# Patient Record
Sex: Female | Born: 1945 | Race: White | Hispanic: No | Smoking: Never smoker
Health system: Southern US, Community
[De-identification: ages and names within clinical notes are randomized; demographics above are authoritative.]

## PROBLEM LIST (undated history)

## (undated) DIAGNOSIS — M1990 Unspecified osteoarthritis, unspecified site: Secondary | ICD-10-CM

## (undated) DIAGNOSIS — E785 Hyperlipidemia, unspecified: Secondary | ICD-10-CM

## (undated) HISTORY — PX: FRACTURE SURGERY: SHX138

## (undated) HISTORY — PX: APPENDECTOMY: SHX54

## (undated) HISTORY — PX: AUGMENTATION MAMMAPLASTY: SUR837

## (undated) HISTORY — PX: KNEE ARTHROSCOPY: SHX127

## (undated) HISTORY — PX: VAGINAL HYSTERECTOMY: SUR661

## (undated) HISTORY — PX: TONSILLECTOMY: SUR1361

## (undated) HISTORY — PX: TUBAL LIGATION: SHX77

---
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
## 2001-12-08 ENCOUNTER — Emergency Department (HOSPITAL_COMMUNITY): Admission: EM | Admit: 2001-12-08 | Discharge: 2001-12-09 | Payer: Self-pay | Admitting: Emergency Medicine

## 2001-12-14 ENCOUNTER — Emergency Department (HOSPITAL_COMMUNITY): Admission: EM | Admit: 2001-12-14 | Discharge: 2001-12-14 | Payer: Self-pay | Admitting: Emergency Medicine

## 2009-04-14 ENCOUNTER — Ambulatory Visit (HOSPITAL_COMMUNITY): Admission: RE | Admit: 2009-04-14 | Discharge: 2009-04-14 | Payer: Self-pay | Admitting: Chiropractic Medicine

## 2016-02-17 ENCOUNTER — Other Ambulatory Visit: Payer: Self-pay | Admitting: Orthopedic Surgery

## 2016-02-17 DIAGNOSIS — R531 Weakness: Secondary | ICD-10-CM

## 2016-02-18 ENCOUNTER — Ambulatory Visit
Admission: RE | Admit: 2016-02-18 | Discharge: 2016-02-18 | Disposition: A | Discharge status: Home / Self Care | Payer: Medicare HMO | Source: Ambulatory Visit | Attending: Orthopedic Surgery | Admitting: Orthopedic Surgery

## 2016-02-18 DIAGNOSIS — R531 Weakness: Secondary | ICD-10-CM

## 2016-07-13 HISTORY — PX: CATARACT EXTRACTION W/ INTRAOCULAR LENS IMPLANT: SHX1309

## 2016-10-03 ENCOUNTER — Inpatient Hospital Stay (HOSPITAL_COMMUNITY): Payer: Medicare HMO | Admitting: Anesthesiology

## 2016-10-03 ENCOUNTER — Inpatient Hospital Stay (HOSPITAL_COMMUNITY): Payer: Medicare HMO

## 2016-10-03 ENCOUNTER — Emergency Department (HOSPITAL_COMMUNITY): Payer: Medicare HMO

## 2016-10-03 ENCOUNTER — Encounter (HOSPITAL_COMMUNITY): Payer: Self-pay | Admitting: Emergency Medicine

## 2016-10-03 ENCOUNTER — Encounter (HOSPITAL_COMMUNITY)
Admission: EM | Disposition: A | Discharge status: Home Health | Payer: Self-pay | Source: Home / Self Care | Attending: Internal Medicine

## 2016-10-03 ENCOUNTER — Inpatient Hospital Stay (HOSPITAL_COMMUNITY)
Admission: EM | Admit: 2016-10-03 | Discharge: 2016-10-12 | DRG: 470 | Disposition: A | Discharge status: Home Health | Payer: Medicare HMO | Attending: Internal Medicine | Admitting: Internal Medicine

## 2016-10-03 DIAGNOSIS — D62 Acute posthemorrhagic anemia: Secondary | ICD-10-CM | POA: Diagnosis not present

## 2016-10-03 DIAGNOSIS — Z8781 Personal history of (healed) traumatic fracture: Secondary | ICD-10-CM

## 2016-10-03 DIAGNOSIS — W010XXA Fall on same level from slipping, tripping and stumbling without subsequent striking against object, initial encounter: Secondary | ICD-10-CM | POA: Diagnosis present

## 2016-10-03 DIAGNOSIS — S72011A Unspecified intracapsular fracture of right femur, initial encounter for closed fracture: Principal | ICD-10-CM | POA: Diagnosis present

## 2016-10-03 DIAGNOSIS — M25551 Pain in right hip: Secondary | ICD-10-CM | POA: Diagnosis present

## 2016-10-03 DIAGNOSIS — S72001A Fracture of unspecified part of neck of right femur, initial encounter for closed fracture: Secondary | ICD-10-CM | POA: Diagnosis not present

## 2016-10-03 DIAGNOSIS — S76811A Strain of other specified muscles, fascia and tendons at thigh level, right thigh, initial encounter: Secondary | ICD-10-CM | POA: Diagnosis not present

## 2016-10-03 DIAGNOSIS — Z967 Presence of other bone and tendon implants: Secondary | ICD-10-CM | POA: Diagnosis not present

## 2016-10-03 DIAGNOSIS — E876 Hypokalemia: Secondary | ICD-10-CM | POA: Diagnosis not present

## 2016-10-03 DIAGNOSIS — S76011A Strain of muscle, fascia and tendon of right hip, initial encounter: Secondary | ICD-10-CM | POA: Diagnosis not present

## 2016-10-03 DIAGNOSIS — Z9889 Other specified postprocedural states: Secondary | ICD-10-CM

## 2016-10-03 DIAGNOSIS — D509 Iron deficiency anemia, unspecified: Secondary | ICD-10-CM | POA: Diagnosis not present

## 2016-10-03 DIAGNOSIS — D638 Anemia in other chronic diseases classified elsewhere: Secondary | ICD-10-CM | POA: Diagnosis not present

## 2016-10-03 HISTORY — PX: HIP ARTHROPLASTY: SHX981

## 2016-10-03 HISTORY — DX: Unspecified osteoarthritis, unspecified site: M19.90

## 2016-10-03 LAB — CBC WITH DIFFERENTIAL/PLATELET
Basophils Absolute: 0 10*3/uL (ref 0.0–0.1)
Basophils Relative: 0 %
EOS ABS: 0 10*3/uL (ref 0.0–0.7)
Eosinophils Relative: 0 %
HEMATOCRIT: 36.3 % (ref 36.0–46.0)
HEMOGLOBIN: 12.6 g/dL (ref 12.0–15.0)
LYMPHS ABS: 0.7 10*3/uL (ref 0.7–4.0)
LYMPHS PCT: 8 %
MCH: 36.6 pg — AB (ref 26.0–34.0)
MCHC: 34.7 g/dL (ref 30.0–36.0)
MCV: 105.5 fL — AB (ref 78.0–100.0)
MONOS PCT: 9 %
Monocytes Absolute: 0.8 10*3/uL (ref 0.1–1.0)
NEUTROS ABS: 7.2 10*3/uL (ref 1.7–7.7)
NEUTROS PCT: 83 %
Platelets: 311 10*3/uL (ref 150–400)
RBC: 3.44 MIL/uL — AB (ref 3.87–5.11)
RDW: 12.9 % (ref 11.5–15.5)
WBC: 8.7 10*3/uL (ref 4.0–10.5)

## 2016-10-03 LAB — ABO/RH: ABO/RH(D): O POS

## 2016-10-03 LAB — COMPREHENSIVE METABOLIC PANEL
ALK PHOS: 84 U/L (ref 38–126)
ALT: 23 U/L (ref 14–54)
ANION GAP: 13 (ref 5–15)
AST: 26 U/L (ref 15–41)
Albumin: 3.9 g/dL (ref 3.5–5.0)
BILIRUBIN TOTAL: 0.7 mg/dL (ref 0.3–1.2)
BUN: 24 mg/dL — ABNORMAL HIGH (ref 6–20)
CALCIUM: 9 mg/dL (ref 8.9–10.3)
CO2: 19 mmol/L — ABNORMAL LOW (ref 22–32)
CREATININE: 0.68 mg/dL (ref 0.44–1.00)
Chloride: 103 mmol/L (ref 101–111)
Glucose, Bld: 103 mg/dL — ABNORMAL HIGH (ref 65–99)
Potassium: 3.6 mmol/L (ref 3.5–5.1)
Sodium: 135 mmol/L (ref 135–145)
TOTAL PROTEIN: 7.1 g/dL (ref 6.5–8.1)

## 2016-10-03 LAB — CBC
HEMATOCRIT: 34 % — AB (ref 36.0–46.0)
HEMOGLOBIN: 11.4 g/dL — AB (ref 12.0–15.0)
MCH: 35.7 pg — ABNORMAL HIGH (ref 26.0–34.0)
MCHC: 33.5 g/dL (ref 30.0–36.0)
MCV: 106.6 fL — ABNORMAL HIGH (ref 78.0–100.0)
Platelets: 274 10*3/uL (ref 150–400)
RBC: 3.19 MIL/uL — AB (ref 3.87–5.11)
RDW: 12.8 % (ref 11.5–15.5)
WBC: 7.9 10*3/uL (ref 4.0–10.5)

## 2016-10-03 LAB — TYPE AND SCREEN
ABO/RH(D): O POS
Antibody Screen: NEGATIVE

## 2016-10-03 LAB — CREATININE, SERUM: Creatinine, Ser: 0.6 mg/dL (ref 0.44–1.00)

## 2016-10-03 LAB — PROTIME-INR
INR: 0.94
Prothrombin Time: 12.5 seconds (ref 11.4–15.2)

## 2016-10-03 SURGERY — HEMIARTHROPLASTY, HIP, DIRECT ANTERIOR APPROACH, FOR FRACTURE
Anesthesia: General | Site: Hip | Laterality: Right

## 2016-10-03 MED ORDER — POLYETHYLENE GLYCOL 3350 17 G PO PACK: 17.0000 g | PACK | Freq: Every day | ORAL | Status: DC | PRN

## 2016-10-03 MED ORDER — ACETAMINOPHEN 325 MG PO TABS: 650.0000 mg | ORAL_TABLET | Freq: Four times a day (QID) | ORAL | Status: DC | PRN

## 2016-10-03 MED ORDER — MORPHINE SULFATE (PF) 4 MG/ML IV SOLN: 2.0000 mg | INTRAVENOUS | Status: DC | PRN

## 2016-10-03 MED ORDER — PROPOFOL 10 MG/ML IV BOLUS
INTRAVENOUS | Status: DC | PRN
  Administered 2016-10-03: 120 mg via INTRAVENOUS

## 2016-10-03 MED ORDER — ENOXAPARIN SODIUM 40 MG/0.4ML ~~LOC~~ SOLN
40.0000 mg | SUBCUTANEOUS | Status: DC
  Administered 2016-10-04 – 2016-10-12 (×9): 40 mg via SUBCUTANEOUS
  Filled 2016-10-03 (×9): qty 0.4

## 2016-10-03 MED ORDER — ONDANSETRON HCL 4 MG/2ML IJ SOLN: 4.0000 mg | Freq: Four times a day (QID) | INTRAMUSCULAR | Status: DC | PRN

## 2016-10-03 MED ORDER — LACTATED RINGERS IV SOLN
INTRAVENOUS | Status: DC | PRN
  Administered 2016-10-03 (×2): via INTRAVENOUS

## 2016-10-03 MED ORDER — CEFAZOLIN SODIUM-DEXTROSE 2-3 GM-% IV SOLR
INTRAVENOUS | Status: DC | PRN
  Administered 2016-10-03: 2 g via INTRAVENOUS

## 2016-10-03 MED ORDER — DEXTROSE 5 % IV SOLN
INTRAVENOUS | Status: DC | PRN
  Administered 2016-10-03: 20 ug/min via INTRAVENOUS

## 2016-10-03 MED ORDER — PHENOL 1.4 % MT LIQD: 1.0000 | OROMUCOSAL | Status: DC | PRN

## 2016-10-03 MED ORDER — SODIUM CHLORIDE 0.9 % IV SOLN: 75.0000 mL/h | INTRAVENOUS | Status: DC

## 2016-10-03 MED ORDER — MAGNESIUM CITRATE PO SOLN: 1.0000 | Freq: Once | ORAL | Status: DC | PRN

## 2016-10-03 MED ORDER — SODIUM CHLORIDE 0.9 % IV BOLUS (SEPSIS)
1000.0000 mL | Freq: Once | INTRAVENOUS | Status: AC
Start: 2016-10-03 — End: 2016-10-03
  Administered 2016-10-03: 1000 mL via INTRAVENOUS

## 2016-10-03 MED ORDER — FENTANYL CITRATE (PF) 100 MCG/2ML IJ SOLN
INTRAMUSCULAR | Status: AC
  Administered 2016-10-03: 25 ug via INTRAVENOUS
  Filled 2016-10-03: qty 2

## 2016-10-03 MED ORDER — ROCURONIUM BROMIDE 100 MG/10ML IV SOLN
INTRAVENOUS | Status: DC | PRN
  Administered 2016-10-03: 50 mg via INTRAVENOUS

## 2016-10-03 MED ORDER — MENTHOL 3 MG MT LOZG: 1.0000 | LOZENGE | OROMUCOSAL | Status: DC | PRN

## 2016-10-03 MED ORDER — HYDROMORPHONE HCL 1 MG/ML IJ SOLN
1.0000 mg | Freq: Once | INTRAMUSCULAR | Status: AC
  Administered 2016-10-03: 1 mg via INTRAVENOUS
  Filled 2016-10-03: qty 1

## 2016-10-03 MED ORDER — SUGAMMADEX SODIUM 200 MG/2ML IV SOLN
INTRAVENOUS | Status: DC | PRN
  Administered 2016-10-03: 127 mg via INTRAVENOUS

## 2016-10-03 MED ORDER — BISACODYL 10 MG RE SUPP
10.0000 mg | Freq: Every day | RECTAL | Status: DC | PRN
  Filled 2016-10-03: qty 1

## 2016-10-03 MED ORDER — LIDOCAINE HCL (CARDIAC) 20 MG/ML IV SOLN
INTRAVENOUS | Status: DC | PRN
  Administered 2016-10-03: 80 mg via INTRAVENOUS

## 2016-10-03 MED ORDER — MORPHINE SULFATE (PF) 2 MG/ML IV SOLN
4.0000 mg | Freq: Once | INTRAVENOUS | Status: AC
  Administered 2016-10-03: 4 mg via INTRAVENOUS
  Filled 2016-10-03: qty 2

## 2016-10-03 MED ORDER — CEFAZOLIN SODIUM-DEXTROSE 2-4 GM/100ML-% IV SOLN
INTRAVENOUS | Status: AC
  Filled 2016-10-03: qty 100

## 2016-10-03 MED ORDER — BACLOFEN 10 MG PO TABS: 10.0000 mg | ORAL_TABLET | Freq: Three times a day (TID) | ORAL | 0 refills | Status: DC

## 2016-10-03 MED ORDER — ACETAMINOPHEN 650 MG RE SUPP: 650.0000 mg | Freq: Four times a day (QID) | RECTAL | Status: DC | PRN

## 2016-10-03 MED ORDER — SENNA-DOCUSATE SODIUM 8.6-50 MG PO TABS: 2.0000 | ORAL_TABLET | Freq: Every day | ORAL | 1 refills | Status: DC

## 2016-10-03 MED ORDER — DEXAMETHASONE SODIUM PHOSPHATE 4 MG/ML IJ SOLN
INTRAMUSCULAR | Status: DC | PRN
  Administered 2016-10-03: 10 mg via INTRAVENOUS

## 2016-10-03 MED ORDER — PHENYLEPHRINE HCL 10 MG/ML IJ SOLN
INTRAMUSCULAR | Status: DC | PRN
  Administered 2016-10-03 (×2): 80 ug via INTRAVENOUS

## 2016-10-03 MED ORDER — HYDROMORPHONE HCL 1 MG/ML IJ SOLN
2.0000 mg | INTRAMUSCULAR | Status: DC | PRN
  Administered 2016-10-03: 2 mg via INTRAVENOUS
  Filled 2016-10-03: qty 2

## 2016-10-03 MED ORDER — ONDANSETRON HCL 4 MG/2ML IJ SOLN
INTRAMUSCULAR | Status: DC | PRN
  Administered 2016-10-03: 4 mg via INTRAVENOUS

## 2016-10-03 MED ORDER — SENNA 8.6 MG PO TABS
1.0000 | ORAL_TABLET | Freq: Two times a day (BID) | ORAL | Status: DC
  Administered 2016-10-03 – 2016-10-12 (×17): 8.6 mg via ORAL
  Filled 2016-10-03 (×18): qty 1

## 2016-10-03 MED ORDER — FERROUS SULFATE 325 (65 FE) MG PO TABS
325.0000 mg | ORAL_TABLET | Freq: Three times a day (TID) | ORAL | Status: DC
  Administered 2016-10-03 – 2016-10-12 (×26): 325 mg via ORAL
  Filled 2016-10-03 (×26): qty 1

## 2016-10-03 MED ORDER — MORPHINE SULFATE (PF) 2 MG/ML IV SOLN: 2.0000 mg | INTRAVENOUS | Status: DC | PRN

## 2016-10-03 MED ORDER — ACETAMINOPHEN 10 MG/ML IV SOLN
INTRAVENOUS | Status: AC
  Filled 2016-10-03: qty 100

## 2016-10-03 MED ORDER — EPHEDRINE SULFATE 50 MG/ML IJ SOLN
INTRAMUSCULAR | Status: DC | PRN
  Administered 2016-10-03: 5 mg via INTRAVENOUS

## 2016-10-03 MED ORDER — FENTANYL CITRATE (PF) 250 MCG/5ML IJ SOLN
INTRAMUSCULAR | Status: AC
  Filled 2016-10-03: qty 5

## 2016-10-03 MED ORDER — ALUM & MAG HYDROXIDE-SIMETH 200-200-20 MG/5ML PO SUSP: 30.0000 mL | ORAL | Status: DC | PRN

## 2016-10-03 MED ORDER — SODIUM CHLORIDE 0.9 % IR SOLN
Status: DC | PRN
Start: 2016-10-03 — End: 2016-10-03
  Administered 2016-10-03: 1000 mL

## 2016-10-03 MED ORDER — HYDROCODONE-ACETAMINOPHEN 10-325 MG PO TABS: 1.0000 | ORAL_TABLET | Freq: Four times a day (QID) | ORAL | 0 refills | Status: DC | PRN

## 2016-10-03 MED ORDER — HYDROCODONE-ACETAMINOPHEN 5-325 MG PO TABS
1.0000 | ORAL_TABLET | Freq: Four times a day (QID) | ORAL | Status: DC | PRN
  Administered 2016-10-03 – 2016-10-04 (×4): 2 via ORAL
  Filled 2016-10-03 (×4): qty 2

## 2016-10-03 MED ORDER — ONDANSETRON HCL 4 MG PO TABS: 4.0000 mg | ORAL_TABLET | Freq: Three times a day (TID) | ORAL | 0 refills | Status: DC | PRN

## 2016-10-03 MED ORDER — FENTANYL CITRATE (PF) 100 MCG/2ML IJ SOLN
25.0000 ug | INTRAMUSCULAR | Status: DC | PRN
  Administered 2016-10-03 (×4): 25 ug via INTRAVENOUS

## 2016-10-03 MED ORDER — DOCUSATE SODIUM 100 MG PO CAPS
100.0000 mg | ORAL_CAPSULE | Freq: Two times a day (BID) | ORAL | Status: DC
  Administered 2016-10-03 – 2016-10-12 (×17): 100 mg via ORAL
  Filled 2016-10-03 (×18): qty 1

## 2016-10-03 MED ORDER — ACETAMINOPHEN 10 MG/ML IV SOLN
INTRAVENOUS | Status: DC | PRN
  Administered 2016-10-03: 1000 mg via INTRAVENOUS

## 2016-10-03 MED ORDER — CEFAZOLIN SODIUM-DEXTROSE 2-4 GM/100ML-% IV SOLN
2.0000 g | Freq: Four times a day (QID) | INTRAVENOUS | Status: AC
  Administered 2016-10-03 – 2016-10-04 (×2): 2 g via INTRAVENOUS
  Filled 2016-10-03 (×2): qty 100

## 2016-10-03 MED ORDER — ONDANSETRON HCL 4 MG/2ML IJ SOLN: 4.0000 mg | Freq: Once | INTRAMUSCULAR | Status: DC | PRN

## 2016-10-03 MED ORDER — ONDANSETRON HCL 4 MG/2ML IJ SOLN
INTRAMUSCULAR | Status: AC
  Filled 2016-10-03: qty 2

## 2016-10-03 MED ORDER — FENTANYL CITRATE (PF) 100 MCG/2ML IJ SOLN
INTRAMUSCULAR | Status: DC | PRN
  Administered 2016-10-03: 50 ug via INTRAVENOUS
  Administered 2016-10-03: 100 ug via INTRAVENOUS
  Administered 2016-10-03: 50 ug via INTRAVENOUS

## 2016-10-03 MED ORDER — POTASSIUM CHLORIDE IN NACL 20-0.9 MEQ/L-% IV SOLN
INTRAVENOUS | Status: DC
  Administered 2016-10-03: 21:00:00 via INTRAVENOUS
  Filled 2016-10-03: qty 1000

## 2016-10-03 MED ORDER — HYDROMORPHONE HCL 1 MG/ML IJ SOLN
1.0000 mg | INTRAMUSCULAR | Status: DC | PRN
  Administered 2016-10-03 – 2016-10-05 (×7): 1 mg via INTRAVENOUS
  Filled 2016-10-03 (×7): qty 1

## 2016-10-03 MED ORDER — ONDANSETRON HCL 4 MG PO TABS: 4.0000 mg | ORAL_TABLET | Freq: Four times a day (QID) | ORAL | Status: DC | PRN

## 2016-10-03 MED ORDER — ENOXAPARIN SODIUM 40 MG/0.4ML ~~LOC~~ SOLN: 40.0000 mg | SUBCUTANEOUS | 0 refills | Status: DC

## 2016-10-03 SURGICAL SUPPLY — 62 items
APL SKNCLS STERI-STRIP NONHPOA (GAUZE/BANDAGES/DRESSINGS) ×1
BENZOIN TINCTURE PRP APPL 2/3 (GAUZE/BANDAGES/DRESSINGS) ×3 IMPLANT
BLADE SAW SGTL 18.5X63.X.64 HD (BLADE) ×3 IMPLANT
BRUSH FEMORAL CANAL (MISCELLANEOUS) IMPLANT
CAPT HIP HEMI 1 ×2 IMPLANT
CLOSURE STERI-STRIP 1/2X4 (GAUZE/BANDAGES/DRESSINGS) ×1
CLSR STERI-STRIP ANTIMIC 1/2X4 (GAUZE/BANDAGES/DRESSINGS) ×2 IMPLANT
COVER BACK TABLE 24X17X13 BIG (DRAPES) IMPLANT
COVER SURGICAL LIGHT HANDLE (MISCELLANEOUS) ×3 IMPLANT
DRAPE IMP U-DRAPE 54X76 (DRAPES) ×3 IMPLANT
DRAPE INCISE IOBAN 66X45 STRL (DRAPES) ×2 IMPLANT
DRAPE ORTHO SPLIT 77X108 STRL (DRAPES) ×6
DRAPE SURG ORHT 6 SPLT 77X108 (DRAPES) ×2 IMPLANT
DRAPE U-SHAPE 47X51 STRL (DRAPES) ×3 IMPLANT
DRILL BIT 5/64 (BIT) ×3 IMPLANT
DRSG MEPILEX BORDER 4X12 (GAUZE/BANDAGES/DRESSINGS) IMPLANT
DRSG MEPILEX BORDER 4X8 (GAUZE/BANDAGES/DRESSINGS) ×2 IMPLANT
DURAPREP 26ML APPLICATOR (WOUND CARE) ×3 IMPLANT
ELECT BLADE 6.5 EXT (BLADE) ×2 IMPLANT
ELECT CAUTERY BLADE 6.4 (BLADE) ×3 IMPLANT
ELECT REM PT RETURN 9FT ADLT (ELECTROSURGICAL) ×3
ELECTRODE REM PT RTRN 9FT ADLT (ELECTROSURGICAL) ×1 IMPLANT
FACESHIELD WRAPAROUND (MASK) ×6 IMPLANT
FACESHIELD WRAPAROUND OR TEAM (MASK) ×2 IMPLANT
GLOVE BIOGEL PI ORTHO PRO SZ8 (GLOVE) ×2
GLOVE ORTHO TXT STRL SZ7.5 (GLOVE) ×3 IMPLANT
GLOVE PI ORTHO PRO STRL SZ8 (GLOVE) ×1 IMPLANT
GLOVE SURG ORTHO 8.0 STRL STRW (GLOVE) ×6 IMPLANT
GOWN STRL REUS W/ TWL XL LVL3 (GOWN DISPOSABLE) ×1 IMPLANT
GOWN STRL REUS W/TWL 2XL LVL3 (GOWN DISPOSABLE) ×3 IMPLANT
GOWN STRL REUS W/TWL XL LVL3 (GOWN DISPOSABLE) ×3
HANDPIECE INTERPULSE COAX TIP (DISPOSABLE)
KIT BASIN OR (CUSTOM PROCEDURE TRAY) ×3 IMPLANT
KIT ROOM TURNOVER OR (KITS) ×3 IMPLANT
MANIFOLD NEPTUNE II (INSTRUMENTS) ×3 IMPLANT
NDL SUT 6 .5 CRC .975X.05 MAYO (NEEDLE) IMPLANT
NEEDLE 22X1 1/2 (OR ONLY) (NEEDLE) ×3 IMPLANT
NEEDLE MAYO 1/2 CIRCLE (NEEDLE) ×2 IMPLANT
NEEDLE MAYO TAPER (NEEDLE)
NS IRRIG 1000ML POUR BTL (IV SOLUTION) ×3 IMPLANT
PACK TOTAL JOINT (CUSTOM PROCEDURE TRAY) ×3 IMPLANT
PACK UNIVERSAL I (CUSTOM PROCEDURE TRAY) ×3 IMPLANT
PAD ARMBOARD 7.5X6 YLW CONV (MISCELLANEOUS) ×6 IMPLANT
PILLOW ABDUCTION HIP (SOFTGOODS) ×3 IMPLANT
PRESSURIZER FEMORAL UNIV (MISCELLANEOUS) IMPLANT
RETRIEVER SUT HEWSON (MISCELLANEOUS) ×3 IMPLANT
SET HNDPC FAN SPRY TIP SCT (DISPOSABLE) IMPLANT
SUT FIBERWIRE #2 38 REV NDL BL (SUTURE) ×6
SUT VIC AB 0 CT1 27 (SUTURE) ×3
SUT VIC AB 0 CT1 27XBRD ANBCTR (SUTURE) ×1 IMPLANT
SUT VIC AB 1 CT1 27 (SUTURE) ×6
SUT VIC AB 1 CT1 27XBRD ANBCTR (SUTURE) ×2 IMPLANT
SUT VIC AB 2-0 CT1 27 (SUTURE) ×3
SUT VIC AB 2-0 CT1 TAPERPNT 27 (SUTURE) IMPLANT
SUT VIC AB 3-0 SH 8-18 (SUTURE) ×3 IMPLANT
SUTURE FIBERWR#2 38 REV NDL BL (SUTURE) ×2 IMPLANT
SYR CONTROL 10ML LL (SYRINGE) ×3 IMPLANT
TOWEL OR 17X24 6PK STRL BLUE (TOWEL DISPOSABLE) ×3 IMPLANT
TOWEL OR 17X26 10 PK STRL BLUE (TOWEL DISPOSABLE) ×3 IMPLANT
TOWER CARTRIDGE SMART MIX (DISPOSABLE) IMPLANT
TRAY FOLEY W/METER SILVER 16FR (SET/KITS/TRAYS/PACK) ×3 IMPLANT
WATER STERILE IRR 1000ML POUR (IV SOLUTION) ×12 IMPLANT

## 2016-10-03 NOTE — ED Triage Notes (Addendum)
Per EMS. Pt from home. Pt reports she slipped and fell 3 weeks ago causing her to have R leg pain. Pt fell again early this am causing R leg to hurt again. Pt reports pain is in R hip and thigh.  Pt reports pain is worst in R groin area to knee. Pt reports she is unable to bear weight on this leg.

## 2016-10-03 NOTE — ED Provider Notes (Signed)
[facility]-EMERGENCY DEPT Provider Note   CSN: [id number] Arrival date & time: 10/03/16  0731     History   Chief Complaint Chief Complaint  Patient presents with  . Leg Pain    HPI Jasmine Benson is a 71 y.o. female previous right knee arthroscopy here presenting with fall, right hip pain. Patient states that she fell about 3 weeks ago and has been having right hip pain since then. She has been walking on it with her crutches. This morning, patient was using her crutches and slipped and fell and landed on the right hip. Denies any passing out or head injury. She does not on blood thinners and last meal was last night around 8 PM.  The history is provided by the patient.    History reviewed. No pertinent past medical history.  There are no active problems to display for this patient.   Past Surgical History:  Procedure Laterality Date  . KNEE SURGERY Left     OB History    No data available       Home Medications    Prior to Admission medications   Not on File    Family History History reviewed. No pertinent family history.  Social History Social History  Substance Use Topics  . Smoking status: Never Smoker  . Smokeless tobacco: Never Used  . Alcohol use 0.6 oz/week    1 Glasses of wine per week     Allergies   Patient has no known allergies.   Review of Systems Review of Systems  Musculoskeletal:       R hip pain   All other systems reviewed and are negative.    Physical Exam Updated Vital Signs BP (!) 144/74 (BP Location: Left Arm)   Pulse (!) 123   Temp 98.2 F (36.8 C) (Oral)   Resp 18   Ht 5\' 6"  (1.676 m)   Wt 63.5 kg (140 lb)   SpO2 97%   BMI 22.60 kg/m   Physical Exam  Constitutional: She is oriented to person, place, and time.  Uncomfortable   HENT:  Head: Normocephalic and atraumatic.  Mouth/Throat: Oropharynx is clear and moist.  Eyes: Pupils are equal, round, and reactive to light. Conjunctivae and EOM are normal.    Neck: Normal range of motion. Neck supple.  Cardiovascular:  Tachycardic   Pulmonary/Chest: Effort normal and breath sounds normal. No respiratory distress. She has no wheezes. She has no rales.  Abdominal: Soft. Bowel sounds are normal. She exhibits no distension. There is no tenderness.  Musculoskeletal:  R hip tender, unable to range it. R hip externally rotated and shortened. 2+ pulses. Able to wiggle toes. Pelvis stable. No midline spinal tenderness   Neurological: She is alert and oriented to person, place, and time.  Skin: Skin is warm.  Psychiatric: She has a normal mood and affect.  Nursing note and vitals reviewed.    ED Treatments / Results  Labs (all labs ordered are listed, but only abnormal results are displayed) Labs Reviewed  CBC WITH DIFFERENTIAL/PLATELET  COMPREHENSIVE METABOLIC PANEL  PROTIME-INR  TYPE AND SCREEN    EKG  EKG Interpretation None       Radiology No results found.  Procedures Procedures (including critical care time)  Medications Ordered in ED Medications  morphine 2 MG/ML injection 4 mg (not administered)  sodium chloride 0.9 % bolus 1,000 mL (not administered)     Initial Impression / Assessment and Plan / ED Course  I have reviewed the  triage vital signs and the nursing notes.  Pertinent labs & imaging results that were available during my care of the patient were reviewed by me and considered in my medical decision making (see chart for details).    Jasmine Benson is a 71 y.o. female here with R hip injury s/p fall. Tachycardic and in pain. Denies chest pain or shortness of breath. Concerned for possible R hip fracture. Will get preop labs, xrays. Will give pain meds and reassess.   9:39 AM Xray showed R subcapital hip fracture. Dr. Dion Saucier consulted and recommend admission to hospitalist, transfer to [facility] for surgery later today, NPO for now.    Final Clinical Impressions(s) / ED Diagnoses   Final diagnoses:  None     New Prescriptions New Prescriptions   No medications on file     Jasmine Pander, MD 10/03/16 [phone]

## 2016-10-03 NOTE — Consult Note (Signed)
  ORTHOPAEDIC CONSULTATION  REQUESTING PHYSICIAN: Salomon Masthatterjee, Srobona Tubl*  Chief Complaint: right hip pain  HPI: Jasmine Benson is a 71 y.o. female who complains of  Right hip pain after a fall.  She had some preexisting hip pain for the past couple of weeks, and was trying to navigate with crutches, when she fell this morning.  She has had a previous knee arthroscopy done earlier this year by Dr. Valentina GuLucy. She also works [employer]. Pain is moderate to severe over the right hip, worse with movement, better with rest. Denies any other injuries.  History reviewed. No pertinent past medical history. Past Surgical History:  Procedure Laterality Date  . KNEE SURGERY Left    Social History   Social History  . Marital status: Single    Spouse name: N/A  . Number of children: N/A  . Years of education: N/A   Social History Main Topics  . Smoking status: Never Smoker  . Smokeless tobacco: Never Used  . Alcohol use 0.6 oz/week    1 Glasses of wine per week  . Drug use: Unknown  . Sexual activity: Not Asked   Other Topics Concern  . None   Social History Narrative  . None   History reviewed. No pertinent family history. No Known Allergies   Positive ROS: All other systems have been reviewed and were otherwise negative with the exception of those mentioned in the HPI and as above.  Physical Exam: General: Alert, no acute distress Cardiovascular: No pedal edema Respiratory: No cyanosis, no use of accessory musculature GI: No organomegaly, abdomen is soft and non-tender Skin: No lesions in the area of chief complaint Neurologic: Sensation intact distally Psychiatric: Patient is competent for consent with normal mood and affect Lymphatic: No axillary or cervical lymphadenopathy  MUSCULOSKELETAL: Right hip EHL and FHL are intact. Positive logroll. Mild bruising laterally. Positive pain to palpation laterally.  Assessment: Displaced right femoral neck  fracture, acute,    Plan: This is an acute severe injury and I recommended surgical intervention with hemiarthroplasty in order to optimize long-term function.  The risks benefits and alternatives were discussed with the patient including but not limited to the risks of nonoperative treatment, versus surgical intervention including infection, bleeding, nerve injury, periprosthetic fracture, the need for revision surgery, dislocation, leg length discrepancy, blood clots, cardiopulmonary complications, morbidity, mortality, among others, and they were willing to proceed.      Eulas PostLANDAU,Inman Fettig P, MD Cell [phone]   10/03/2016 2:16 PM

## 2016-10-03 NOTE — ED Notes (Signed)
Bed: WA12 Expected date:  Expected time:  Means of arrival:  Comments: 71 yo Fall, leg/hip pain

## 2016-10-03 NOTE — Anesthesia Preprocedure Evaluation (Addendum)
Anesthesia Evaluation  Patient identified by MRN, date of birth, ID band Patient awake    Reviewed: Allergy & Precautions, NPO status , Patient's Chart, lab work & pertinent test results  Airway Mallampati: II  TM Distance: >3 FB Neck ROM: Full    Dental  (+) Teeth Intact, Dental Advisory Given, Caps   Pulmonary neg pulmonary ROS,    Pulmonary exam normal breath sounds clear to auscultation       Cardiovascular negative cardio ROS Normal cardiovascular exam Rhythm:Regular Rate:Normal     Neuro/Psych negative neurological ROS     GI/Hepatic negative GI ROS, Neg liver ROS,   Endo/Other  negative endocrine ROS  Renal/GU negative Renal ROS     Musculoskeletal  (+) Arthritis , Osteoarthritis,  Right femoral neck fracture    Abdominal   Peds  Hematology negative hematology ROS (+) Plt 311k   Anesthesia Other Findings Day of surgery medications reviewed with the patient.  Reproductive/Obstetrics                            Anesthesia Physical Anesthesia Plan  ASA: II  Anesthesia Plan: General   Post-op Pain Management:    Induction: Intravenous  PONV Risk Score and Plan: 3 and Ondansetron, Dexamethasone and Midazolam  Airway Management Planned: Oral ETT  Additional Equipment:   Intra-op Plan:   Post-operative Plan: Extubation in OR  Informed Consent: I have reviewed the patients History and Physical, chart, labs and discussed the procedure including the risks, benefits and alternatives for the proposed anesthesia with the patient or authorized representative who has indicated his/her understanding and acceptance.   Dental advisory given  Plan Discussed with: CRNA  Anesthesia Plan Comments: (Risks/benefits of general anesthesia discussed with patient including risk of damage to teeth, lips, gum, and tongue, nausea/vomiting, allergic reactions to medications, and the possibility of  heart attack, stroke and death.  All patient questions answered.  Patient wishes to proceed.)        Anesthesia Quick Evaluation

## 2016-10-03 NOTE — Op Note (Signed)
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 female who was admitted 10/03/2016 with a diagnosis of Hip fracture, right, closed, initial encounter [facility]) and elected for surgical management.  The risks benefits and alternatives were discussed with the patient including but not limited to the risks of nonoperative treatment, versus surgical intervention including infection, bleeding, nerve injury, periprosthetic fracture, the need for revision surgery, dislocation, leg length discrepancy, blood clots, cardiopulmonary complications, morbidity, mortality, among others, and they were willing to proceed.  Predicted outcome is good, although there will be at least a six to nine month expected recovery.   OPERATIVE REPORT     SURGEON:  Marchia Bond, MD    ASSISTANT:  Joya Gaskins, OPA-C  (Present throughout the entire procedure,  necessary for completion of procedure in a timely manner, assisting with retraction, instrumentation, and closure)     ANESTHESIA:  General  ESTIMATED BLOOD LOSS: 509 mL    COMPLICATIONS:  None.      COMPONENTS:  Chemical engineer Femoral Fracture stem size 5, with a -3 spacer and a size 47 fracture head unipolar hip ball.  I used a total of 3 #2 FiberWire for repair of the gluteus medius tendon brought through the trochanteric drill holes that we used for the capsular repair. One of the fiber wires I used side-to-side configuration.  Unique aspects of the case: I ended up having to cut the femoral neck twice because the first cut was fairly long, and the neck  was long, and reduction was not possible. Soft tissue was too tight. Additionally I did use a calcar planer. I had to release the capsule down to the level of the acetabulum in order to gain mobility for reduction.  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    PROCEDURE IN DETAIL: The patient was met in the holding area and identified.  The appropriate hip  was marked at the operative site. The patient was then transported to the OR and  placed under general anesthesia.  At that point, the patient was  placed in the lateral decubitus position with the operative side up and  secured to the operating room table and all bony prominences padded.     The operative lower extremity was prepped from the iliac crest to the toes.  Sterile draping was performed.  Time out was performed prior to incision.      A routine posterolateral approach was utilized via sharp dissection  carried down to the subcutaneous tissue.  Gross bleeders were Bovie  coagulated.  The iliotibial band was identified and incised  along the length of the skin incision.  Self-retaining retractors were  inserted.  With the hip internally rotated, the short external rotators  were identified. The piriformis was tagged with FiberWire, and the hip capsule released in a T-type fashion.  The femoral neck was exposed, and I resected the femoral neck using the appropriate jig. This was performed at approximately a thumb's breadth above the lesser trochanter.    I then exposed the deep acetabulum,  cleared out any tissue including the ligamentum teres, and included the hip capsule in the FiberWire used above and below the T.    I then prepared the proximal femur using the cookie-cutter, the lateralizing reamer, and then sequentially broached. On my first set of trials, I was not able to reduce the hip and I had to go back and cut the femur  again.  A trial was then utilized, and I reduced the hip and it was found to have excellent stability with functional range of motion. The trial components were then removed.   I then place the real implant and I impacted the real head ball into place. The hip was then reduced and taken through functional range of motion and found to have excellent stability. Leg lengths were restored.  I then used a 2 mm drill bits to pass the FiberWire suture from the capsule and piriformis through the greater trochanter, and secured this. Excellent posterior capsular repair was achieved. I also closed the T in the capsule.  I also applied a curette to the lateral greater trochanter where the avulsed portion of the tendon was present, with the gluteus medius sleeve, and I performed a side-to-side repair superiorly of the gluteus sleeve at the avulsed area, and then used the suture that I brought out through the drill holes in the greater trochanter and used a free needle to pass each of the 4 limbs from the suture through the soft tissue, and then repaired that back down to the bleeding bony bed that I had already picked prepared. Excellent repair of the gluteus sleeve was achieved. I suspect that she had preoperative pain in this location from her chronic tear. She did mention that she had preoperative pain as well, we were considering whether or not this was from her hip and if she had a nondisplaced neck fracture, but it certainly may have been from the tendon as well.  I then irrigated the hip copiously again with pulse lavage, and repaired the fascia with Vicryl, followed by Vicryl for the subcutaneous tissue, Monocryl for the skin, Steri-Strips and sterile gauze. The wounds were injected. The patient was then awakened and returned to PACU in stable and satisfactory condition. There were no complications.  Marchia Bond, MD Orthopedic Surgeon [phone]   10/03/2016 4:30 PM

## 2016-10-03 NOTE — Progress Notes (Signed)
Patient arrived to the floor via bed. Alert, Oriented X4. VSS. Dressing to right hip clean, dry and intact. Patient oriented to room. RN to release orders. Will continue to monitor.

## 2016-10-03 NOTE — ED Notes (Signed)
Pt in xray

## 2016-10-03 NOTE — Transfer of Care (Signed)
Immediate Anesthesia Transfer of Care Note  Patient: Jasmine Benson  Procedure(s) Performed: Procedure(s): ARTHROPLASTY BIPOLAR HIP (HEMIARTHROPLASTY) (Right)  Patient Location: PACU  Anesthesia Type:General  Level of Consciousness: awake, alert  and oriented  Airway & Oxygen Therapy: Patient Spontanous Breathing and Patient connected to nasal cannula oxygen  Post-op Assessment: Report given to RN and Post -op Vital signs reviewed and stable  Post vital signs: Reviewed and stable  Last Vitals:  Vitals:   10/03/16 1130 10/03/16 1230  BP: 125/72 105/74  Pulse:  (!) 110  Resp: 17 14  Temp:    SpO2:  93%    Last Pain:  Vitals:   10/03/16 1103  TempSrc:   PainSc: 8          Complications: No apparent anesthesia complications

## 2016-10-03 NOTE — Anesthesia Postprocedure Evaluation (Signed)
Anesthesia Post Note  Patient: Erskine EmerySharron M Salim  Procedure(s) Performed: Procedure(s) (LRB): ARTHROPLASTY BIPOLAR HIP (HEMIARTHROPLASTY) (Right)     Patient location during evaluation: PACU Anesthesia Type: General Level of consciousness: awake and alert Pain management: pain level controlled Vital Signs Assessment: post-procedure vital signs reviewed and stable Respiratory status: spontaneous breathing, nonlabored ventilation, respiratory function stable and patient connected to nasal cannula oxygen Cardiovascular status: blood pressure returned to baseline and stable Postop Assessment: no signs of nausea or vomiting Anesthetic complications: no    Last Vitals:  Vitals:   10/03/16 1756 10/03/16 1813  BP:  124/64  Pulse: 84 97  Resp: 12 13  Temp: (!) 36.2 C (!) 36.4 C  SpO2: 96% 99%    Last Pain:  Vitals:   10/03/16 1813  TempSrc: Oral  PainSc:                  Cecile HearingStephen Edward Carlos Quackenbush

## 2016-10-03 NOTE — Discharge Instructions (Signed)

## 2016-10-03 NOTE — Anesthesia Procedure Notes (Signed)
Procedure Name: Intubation Date/Time: 10/03/2016 2:34 PM Performed by: Clearnce Sorrel Pre-anesthesia Checklist: Patient identified, Emergency Drugs available, Suction available, Patient being monitored and Timeout performed Patient Re-evaluated:Patient Re-evaluated prior to induction Oxygen Delivery Method: Circle system utilized Preoxygenation: Pre-oxygenation with 100% oxygen Induction Type: IV induction Ventilation: Mask ventilation without difficulty and Oral airway inserted - appropriate to patient size Laryngoscope Size: Mac and 3 Grade View: Grade I Tube type: Oral Tube size: 7.0 mm Number of attempts: 1 Airway Equipment and Method: Stylet Placement Confirmation: ETT inserted through vocal cords under direct vision,  positive ETCO2 and breath sounds checked- equal and bilateral Secured at: 22 cm Tube secured with: Tape Dental Injury: Teeth and Oropharynx as per pre-operative assessment

## 2016-10-04 ENCOUNTER — Encounter (HOSPITAL_COMMUNITY): Payer: Self-pay | Admitting: Orthopedic Surgery

## 2016-10-04 DIAGNOSIS — D62 Acute posthemorrhagic anemia: Secondary | ICD-10-CM

## 2016-10-04 LAB — CBC
HCT: 30.5 % — ABNORMAL LOW (ref 36.0–46.0)
HEMOGLOBIN: 10.1 g/dL — AB (ref 12.0–15.0)
MCH: 35.6 pg — AB (ref 26.0–34.0)
MCHC: 33.1 g/dL (ref 30.0–36.0)
MCV: 107.4 fL — AB (ref 78.0–100.0)
PLATELETS: 277 10*3/uL (ref 150–400)
RBC: 2.84 MIL/uL — AB (ref 3.87–5.11)
RDW: 12.8 % (ref 11.5–15.5)
WBC: 7.8 10*3/uL (ref 4.0–10.5)

## 2016-10-04 LAB — BASIC METABOLIC PANEL
ANION GAP: 9 (ref 5–15)
BUN: 10 mg/dL (ref 6–20)
CHLORIDE: 106 mmol/L (ref 101–111)
CO2: 20 mmol/L — AB (ref 22–32)
Calcium: 7.9 mg/dL — ABNORMAL LOW (ref 8.9–10.3)
Creatinine, Ser: 0.64 mg/dL (ref 0.44–1.00)
GFR calc non Af Amer: >60 mL/min (ref >60)
Glucose, Bld: 176 mg/dL — ABNORMAL HIGH (ref 65–99)
Potassium: 3.9 mmol/L (ref 3.5–5.1)
SODIUM: 135 mmol/L (ref 135–145)

## 2016-10-04 MED ORDER — HYDROCODONE-ACETAMINOPHEN 5-325 MG PO TABS
1.0000 | ORAL_TABLET | ORAL | Status: DC | PRN
  Administered 2016-10-04 – 2016-10-05 (×4): 2 via ORAL
  Filled 2016-10-04 (×4): qty 2

## 2016-10-04 NOTE — Evaluation (Signed)
Occupational Therapy Evaluation Patient Details Name: Jasmine Benson MRN: [id number] DOB: 1945/08/09 Today's Date: 10/04/2016    History of Present Illness 72 yo admitted after fall at home with right hip fx s/p hemiarthoplasty posterior approach with glute med tendon repair since fall in July. PMHx:    Clinical Impression   PTA, pt was living alone and was independent. Currently, pt requires Max A for LB ADLs and Min Guard A for functional mobility using RW. Provided education on posterior hip precautions, need for AE, and toilet transfer with 3N1. Pt would benefit form further acute OT to address LB ADLs and facilitate safe dc. Recommend dc home with HHOT to optimize safety and independence with ADLs and functional mobility.     Follow Up Recommendations  Home health OT;Supervision - Intermittent    Equipment Recommendations  3 in 1 bedside commode    Recommendations for Other Services PT consult     Precautions / Restrictions Precautions Precautions: Fall;Posterior Hip Precaution Booklet Issued: Yes (comment) Precaution Comments: Reviewed Hip precautions. Pt able to recall 2/3 precautions independently with Min VCs to remember no bending past 90 Restrictions Weight Bearing Restrictions: Yes RLE Weight Bearing: Weight bearing as tolerated      Mobility Bed Mobility Overal bed mobility: Needs Assistance Bed Mobility: Supine to Sit     Supine to sit: Supervision;HOB elevated     General bed mobility comments: pt able to pivot to EOB with cues and good ability to maintain precautions, Pt used bedrails and HOB elevated  Transfers Overall transfer level: Needs assistance   Transfers: Sit to/from Stand Sit to Stand: Min guard         General transfer comment: cues for hand and RLE positioning    Balance Overall balance assessment: History of Falls                                         ADL either performed or assessed with clinical  judgement   ADL Overall ADL's : Needs assistance/impaired Eating/Feeding: Set up;Sitting   Grooming: Min guard;Standing   Upper Body Bathing: Set up;Sitting   Lower Body Bathing: Maximal assistance;Sit to/from stand Lower Body Bathing Details (indicate cue type and reason): Pt will need education on LB ADLs with AE to adhere to precautions Upper Body Dressing : Set up;Sitting   Lower Body Dressing: Maximal assistance;Sit to/from stand Lower Body Dressing Details (indicate cue type and reason): Pt will need education on LB ADLs with AE to adhere to precautions Toilet Transfer: Min guard;Ambulation;RW (Simulated to recliner)           Functional mobility during ADLs: Min guard;Rolling walker General ADL Comments: Pt demonstrating good functional performance post surgery. Able to perform UB ADLs and functional mobility with set up-Min guard. Pt will require education for LB ADLs for adherance to posterior hip precautions.      Vision         Perception     Praxis      Pertinent Vitals/Pain Pain Assessment: 0-10 Pain Score: 6  Pain Location: right hip Pain Descriptors / Indicators: Throbbing Pain Intervention(s): Monitored during session;Limited activity within patient's tolerance;Repositioned     Hand Dominance Right   Extremity/Trunk Assessment Upper Extremity Assessment Upper Extremity Assessment: Overall WFL for tasks assessed   Lower Extremity Assessment Lower Extremity Assessment: Defer to PT evaluation RLE Deficits / Details: decreased ROm and strength post  op   Cervical / Trunk Assessment Cervical / Trunk Assessment: Normal   Communication Communication Communication: No difficulties   Cognition Arousal/Alertness: Awake/alert Behavior During Therapy: WFL for tasks assessed/performed Overall Cognitive Status: Within Functional Limits for tasks assessed                                     General Comments   VSS    Exercises     Shoulder Instructions      Home Living Family/patient expects to be discharged to:: Private residence Living Arrangements: Alone Available Help at Discharge: Friend(s);Available PRN/intermittently Type of Home: House Home Access: Stairs to enter Entrance Stairs-Number of Steps: 1   Home Layout: Two level;1/2 bath on main level;Bed/bath upstairs Alternate Level Stairs-Number of Steps: 16 Alternate Level Stairs-Rails: Left Bathroom Shower/Tub: Producer, television/film/video: Standard     Home Equipment: Crutches;Hand held shower head   Additional Comments: pt has been sleeping downstairs since fall in July      Prior Functioning/Environment Level of Independence: Independent        Comments: with crutches since glute med tear, sponge bathing downstairs and sleeping on couch, continues to drive and go to work        OT Problem List: Decreased range of motion;Decreased activity tolerance;Impaired balance (sitting and/or standing);Decreased safety awareness;Decreased knowledge of use of DME or AE;Decreased knowledge of precautions;Pain      OT Treatment/Interventions: Self-care/ADL training;Therapeutic exercise;Energy conservation;DME and/or AE instruction;Therapeutic activities;Patient/family education    OT Goals(Current goals can be found in the care plan section) Acute Rehab OT Goals 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 Goal Formulation: With patient Time For Goal Achievement: 10/18/16 Potential to Achieve Goals: Good ADL Goals Pt Will Perform Lower Body Bathing: with set-up;with supervision;sit to/from stand;with adaptive equipment Pt Will Perform Lower Body Dressing: with set-up;with supervision;sit to/from stand;with adaptive equipment Pt Will Transfer to Toilet: bedside commode;ambulating;with set-up;with supervision (BSC over toilet) Additional ADL Goal #1: Pt will independently recall 3/3 posterior hip precautions  OT Frequency: Min  2X/week   Barriers to D/C:            Co-evaluation              AM-PAC PT "6 Clicks" Daily Activity     Outcome Measure Help from another person eating meals?: None Help from another person taking care of personal grooming?: A Little Help from another person toileting, which includes using toliet, bedpan, or urinal?: A Little Help from another person bathing (including washing, rinsing, drying)?: A Little Help from another person to put on and taking off regular upper body clothing?: None Help from another person to put on and taking off regular lower body clothing?: A Lot 6 Click Score: 19   End of Session Equipment Utilized During Treatment: Gait belt;Rolling walker Nurse Communication: Mobility status;Precautions;Weight bearing status  Activity Tolerance: Patient tolerated treatment well Patient left: in chair;with call bell/phone within reach  OT Visit Diagnosis: Unsteadiness on feet (R26.81);Other abnormalities of gait and mobility (R26.89);Muscle weakness (generalized) (M62.81);Pain;History of falling (Z91.81) Pain - Right/Left: Right Pain - part of body: Hip                Time: 1610-9604 OT Time Calculation (min): 22 min Charges:  OT General Charges $OT Visit: 1 Procedure OT Evaluation $OT Eval Low Complexity: 1 Procedure G-Codes:     Geraldine Tesar MSOT, OTR/L  Acute Rehab Pager: [phone] Office: [phone]  Theodoro GristCharis M Shonica Weier 10/04/2016, 12:53 PM

## 2016-10-04 NOTE — Progress Notes (Signed)
PROGRESS NOTE  Jasmine Benson  AVW:[id number] DOB: Oct 11, 1945 DOA: 10/03/2016 PCP: Patient, No Pcp Per  Brief Narrative:   71 yo F with presented with right hip pain.  She is otherwise healthy but a few weeks ago, she almost slipped on an ice cube on her floor and since that time, she has been trying to use crutches for pain in her right hip.  She had a mechanical fall that resulted in ER visit where she was found to have a right femoral neck fracture.  She underwent right hip hemiarthroplasty and right hip repair of her gluteus medius tendon on 10/03/2016.  She is doing well post-operatively, POD 1.  She lives home alone.  PT/OT recommending intermittent supervision.    Assessment & Plan:   Principal Problem:   Hip fracture, right, closed, initial encounter (HCC)  Closed right hip fracture, s/p right hip hemiarthroplasty and right hip repair of her gluteus medius tendon on 10/03/2016 -  Requesting increased frequency of pain medication -  PT/OT recommending home health services, which have been ordered -  WBAT -  DVT prophylaxis per orthopedic surgery  Mild post-operative anemia expected -   No need for blood transfusion -  Continue iron supplementation  DVT prophylaxis:  lovenox Code Status:  full Family Communication:  Patient alone Disposition Plan:  To home, but patient lives alone and will need at least intermittent supervision.  HH services ordered.     Consultants:   ORthopedic surgery, Dr. Dion SaucierLandau  Procedures:  right hip hemiarthroplasty and right hip repair of her gluteus medius tendon on 10/03/2016  Antimicrobials:  Anti-infectives    Start     Dose/Rate Route Frequency Ordered Stop   10/03/16 2030  ceFAZolin (ANCEF) IVPB 2g/100 mL premix     2 g 200 mL/hr over 30 Minutes Intravenous Every 6 hours 10/03/16 1641 10/04/16 0347   10/03/16 1358  ceFAZolin (ANCEF) 2-4 GM/100ML-% IVPB    Comments:  Jasmine Benson   : cabinet override      10/03/16 1358 10/04/16  0214       Subjective:  Feeling well.  Was able to walk a Jasmine Benson distance with PT today.  Denies chest pains, SOB, nausea.  Ate a good breakfast this morning. Pain medication wearing off too soon.    Objective: Vitals:   10/04/16 0215 10/04/16 0457 10/04/16 1023 10/04/16 1332  BP: (!) 113/59 106/63 (!) 90/53 117/66  Pulse: 85 79 88 (!) 102  Resp: 14 14 16 18   Temp: 98.3 F (36.8 C) 98.3 F (36.8 C) 97.8 F (36.6 C) 98.1 F (36.7 C)  TempSrc: Oral Oral Oral Oral  SpO2: 94% 94% 96% 98%  Weight:      Height:        Intake/Output Summary (Last 24 hours) at 10/04/16 1359 Last data filed at 10/04/16 1334  Gross per 24 hour  Intake          3418.75 ml  Output             2375 ml  Net          1043.75 ml   Filed Weights   10/03/16 0737  Weight: 63.5 kg (140 lb)    Examination:  General exam:  Adult female, sitting in chair.  No acute distress.  HEENT:  NCAT, MMM Respiratory system: Clear to auscultation bilaterally Cardiovascular system: Regular rate and rhythm, normal S1/S2. No murmurs, rubs, gallops or clicks.  Warm extremities Gastrointestinal system: Normal active bowel sounds,  soft, nondistended, nontender. MSK:  Normal tone and bulk, no lower extremity edema, right hip minimally swollen.  Dressing with a single drop of dried blood, no obvious ecchymoses Neuro:  Grossly intact, SITLT bilateral feet, 2+ pedal pulses    Data Reviewed: I have personally reviewed following labs and imaging studies  CBC:  Recent Labs Lab 10/03/16 0808 10/03/16 2002 10/04/16 0347  WBC 8.7 7.9 7.8  NEUTROABS 7.2  --   --   HGB 12.6 11.4* 10.1*  HCT 36.3 34.0* 30.5*  MCV 105.5* 106.6* 107.4*  PLT 311 274 277   Basic Metabolic Panel:  Recent Labs Lab 10/03/16 0808 10/03/16 2002 10/04/16 0347  NA 135  --  135  K 3.6  --  3.9  CL 103  --  106  CO2 19*  --  20*  GLUCOSE 103*  --  176*  BUN 24*  --  10  CREATININE 0.68 0.60 0.64  CALCIUM 9.0  --  7.9*   GFR: Estimated  Creatinine Clearance: 61.3 mL/min (by C-G formula based on SCr of 0.64 mg/dL). Liver Function Tests:  Recent Labs Lab 10/03/16 0808  AST 26  ALT 23  ALKPHOS 84  BILITOT 0.7  PROT 7.1  ALBUMIN 3.9   No results for input(s): LIPASE, AMYLASE in the last 168 hours. No results for input(s): AMMONIA in the last 168 hours. Coagulation Profile:  Recent Labs Lab 10/03/16 0808  INR 0.94   Cardiac Enzymes: No results for input(s): CKTOTAL, CKMB, CKMBINDEX, TROPONINI in the last 168 hours. BNP (last 3 results) No results for input(s): PROBNP in the last 8760 hours. HbA1C: No results for input(s): HGBA1C in the last 72 hours. CBG: No results for input(s): GLUCAP in the last 168 hours. Lipid Profile: No results for input(s): CHOL, HDL, LDLCALC, TRIG, CHOLHDL, LDLDIRECT in the last 72 hours. Thyroid Function Tests: No results for input(s): TSH, T4TOTAL, FREET4, T3FREE, THYROIDAB in the last 72 hours. Anemia Panel: No results for input(s): VITAMINB12, FOLATE, FERRITIN, TIBC, IRON, RETICCTPCT in the last 72 hours. Urine analysis: No results found for: COLORURINE, APPEARANCEUR, LABSPEC, PHURINE, GLUCOSEU, HGBUR, BILIRUBINUR, KETONESUR, PROTEINUR, UROBILINOGEN, NITRITE, LEUKOCYTESUR Sepsis Labs: @LABRCNTIP (procalcitonin:4,lacticidven:4)  )No results found for this or any previous visit (from the past 240 hour(s)).    Radiology Studies: Dg Chest 1 View  Result Date: 10/03/2016 CLINICAL DATA:  Recent fall with right femoral fracture EXAM: CHEST 1 VIEW COMPARISON:  None. FINDINGS: Cardiac shadow is within normal limits. Calcified breast implants are noted. The lungs are well-aerated without focal infiltrate. No acute bony abnormality is seen. IMPRESSION: No active disease. Electronically Signed   By: Alcide Clever M.D.   On: 10/03/2016 09:20   Dg Hip Port Unilat With Pelvis 1v Right  Result Date: 10/03/2016 CLINICAL DATA:  Hip fracture. EXAM: DG HIP (WITH OR WITHOUT PELVIS) 1V PORT RIGHT  COMPARISON:  Radiographs from earlier today. 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 The femoral component is well seated without complicating features. The pubic symphysis, pubic bones and left hip joint are maintained. 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 Electronically Signed   By: Rudie Meyer M.D.   On: 10/03/2016 17:46   Dg Hip Unilat  With Pelvis 2-3 Views Right  Result Date: 10/03/2016 CLINICAL DATA:  Recent slip and fall with right hip pain, initial encounter EXAM: DG HIP (WITH OR WITHOUT PELVIS) 2-3V RIGHT COMPARISON:  None. FINDINGS: There is a subcapital femoral neck fracture with impaction and  angulation at the fracture site. Pelvic ring is intact. The proximal femur is otherwise within normal limits. IMPRESSION: Subcapital right femoral neck fracture Electronically Signed   By: Alcide Clever M.D.   On: 10/03/2016 09:19     Scheduled Meds: . docusate sodium  100 mg Oral BID  . enoxaparin (LOVENOX) injection  40 mg Subcutaneous Q24H  . ferrous sulfate  325 mg Oral TID PC  . senna  1 tablet Oral BID   Continuous Infusions: . sodium chloride Stopped (10/04/16 1113)  . 0.9 % NaCl with KCl 20 mEq / L Stopped (10/04/16 1113)     LOS: 1 day    Time spent: 30 min    Renae Fickle, MD [facility] Pager [phone]  If 7PM-7AM, please contact night-coverage [email or web address] Password [email or web address] 10/04/2016, 1:59 PM

## 2016-10-04 NOTE — Evaluation (Signed)
Physical Therapy Evaluation Patient Details Name: Jasmine Benson MRN: [id number] DOB: December 01, 1945 Today's Date: 10/04/2016   History of Present Illness  71 yo admitted after fall at home with right hip fx s/p hemiarthoplasty posterior approach with glute med tendon repair since fall in July. PMHx:   Clinical Impression  Pt pleasant and reports she hasn't slept and is frustrated with pain, lack of sleep and lack of independence. Pt with decreased strength, function, mobility and gait who will benefit from acute therapy to maximize mobility, function and independence to decrease fall risk and burden of care. Pt educated for all precautions, exercises, transfers and gait with encouragement to mobilize with nursing and ambulate to bathroom.     Follow Up Recommendations Home health PT    Equipment Recommendations  Rolling walker with 5" wheels;3in1 (PT)    Recommendations for Other Services OT consult     Precautions / Restrictions Precautions Precautions: Fall;Posterior Hip Precaution Booklet Issued: Yes (comment) Restrictions RLE Weight Bearing: Weight bearing as tolerated      Mobility  Bed Mobility Overal bed mobility: Needs Assistance Bed Mobility: Supine to Sit     Supine to sit: Supervision;HOB elevated     General bed mobility comments: pt able to pivot to EOB with cues and good ability to maintain precautions, HOB 35 degrees  Transfers Overall transfer level: Needs assistance   Transfers: Sit to/from Stand Sit to Stand: Min guard         General transfer comment: cues for hand and RLE positioning  Ambulation/Gait Ambulation/Gait assistance: Min guard Ambulation Distance (Feet): 100 Feet Assistive device: Rolling walker (2 wheeled) Gait Pattern/deviations: Step-to pattern;Decreased stride length;Decreased stance time - right   Gait velocity interpretation: Below normal speed for age/gender General Gait Details: cues for sequence, posture and  precautions  Stairs            Wheelchair Mobility    Modified Rankin (Stroke Patients Only)       Balance Overall balance assessment: History of Falls                                           Pertinent Vitals/Pain Pain Assessment: 0-10 Pain Score: 6  Pain Location: right hip Pain Descriptors / Indicators: Throbbing Pain Intervention(s): Limited activity within patient's tolerance;Premedicated before session;Monitored during session;Repositioned    Home Living Family/patient expects to be discharged to:: Private residence Living Arrangements: Alone   Type of Home: House Home Access: Stairs to enter   Secretary/administrator of Steps: 1 Home Layout: Two level;1/2 bath on main level;Bed/bath upstairs Home Equipment: Crutches Additional Comments: pt has been sleeping downstairs since fall in July    Prior Function Level of Independence: Independent with assistive device(s)         Comments: with crutches since glute med tear, sponge bathing downstairs and sleeping on couch     Hand Dominance        Extremity/Trunk Assessment   Upper Extremity Assessment Upper Extremity Assessment: Overall WFL for tasks assessed    Lower Extremity Assessment Lower Extremity Assessment: RLE deficits/detail RLE Deficits / Details: decreased ROm and strength post op    Cervical / Trunk Assessment Cervical / Trunk Assessment: Normal  Communication   Communication: No difficulties  Cognition Arousal/Alertness: Awake/alert Behavior During Therapy: WFL for tasks assessed/performed Overall Cognitive Status: Within Functional Limits for tasks assessed  General Comments      Exercises Total Joint Exercises Heel Slides: AROM;Right;Supine;10 reps Hip ABduction/ADduction: AROM;Right;Supine;10 reps   Assessment/Plan    PT Assessment Patient needs continued PT services  PT Problem List Decreased  strength;Decreased mobility;Decreased range of motion;Decreased activity tolerance;Decreased balance;Decreased knowledge of use of DME;Pain;Decreased knowledge of precautions       PT Treatment Interventions Gait training;Therapeutic exercise;Patient/family education;Stair training;Functional mobility training;DME instruction;Therapeutic activities    PT Goals (Current goals can be found in the Care Plan section)  Acute Rehab PT Goals 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 : A Little Difficulty sitting down on and standing up from a chair with arms (e.g., wheelchair, bedside commode, etc,.)?: A Little Help needed moving to and from a bed to chair (including a wheelchair)?: A Little Help needed walking in hospital room?: A Little Help needed climbing 3-5 steps with a railing? : A Little 6 Click Score: 18    End of Session Equipment Utilized During Treatment: Gait belt Activity Tolerance: Patient tolerated treatment well Patient left: in chair;with chair alarm set;with call bell/phone within reach Nurse Communication: Mobility status PT Visit Diagnosis: Repeated falls (R29.6);Other abnormalities of gait and mobility (R26.89);Difficulty in walking, not elsewhere classified (R26.2);Pain Pain - Right/Left: Right Pain - part of body: Hip    Time: [id number] PT Time Calculation (min) (ACUTE ONLY): 34 min   Charges:   PT Evaluation $PT Eval Moderate Complexity: 1 Mod PT Treatments $Gait Training: 8-22 mins   PT G Codes:         Delaney MeigsMaija Tabor Jaydyn Bozzo, PT [phone]   Jermarion Poffenberger B Shayma Pfefferle 10/04/2016, 10:00 AM

## 2016-10-04 NOTE — Clinical Social Work Note (Signed)
CSW consulted for SNF placement. P/T & O/T recommending HHPT. RNCM aware and following for discharge needs. CSW signing off as no further Social Work needs identified.   Corlis HoveJeneya Brittne Kawasaki, LCSWA, LCASA Clinical Social Work  [phone]

## 2016-10-04 NOTE — Progress Notes (Signed)
Patient ID: Erskine EmerySharron M Benson, female   DOB: March 11, 1945, 71 y.o.   MRN: [id number]     Subjective:  Patient reports pain as mild to moderate.  Patient in bed and in no acute distress.  Denies any CP or SOB  Objective:   VITALS:   Vitals:   10/03/16 1813 10/03/16 2210 10/04/16 0215 10/04/16 0457  BP: 124/64 120/60 (!) 113/59 106/63  Pulse: 97 99 85 79  Resp: 13 14 14 14   Temp: (!) 97.5 F (36.4 C) 97.7 F (36.5 C) 98.3 F (36.8 C) 98.3 F (36.8 C)  TempSrc: Oral Oral Oral Oral  SpO2: 99% 97% 94% 94%  Weight:      Height:        ABD soft Sensation intact distally Dorsiflexion/Plantar flexion intact Incision: dressing C/D/I and no drainage   Lab Results  Component Value Date   WBC 7.8 10/04/2016   HGB 10.1 (L) 10/04/2016   HCT 30.5 (L) 10/04/2016   MCV 107.4 (H) 10/04/2016   PLT 277 10/04/2016   BMET    Component Value Date/Time   NA 135 10/04/2016 0347   K 3.9 10/04/2016 0347   CL 106 10/04/2016 0347   CO2 20 (L) 10/04/2016 0347   GLUCOSE 176 (H) 10/04/2016 0347   BUN 10 10/04/2016 0347   CREATININE 0.64 10/04/2016 0347   CALCIUM 7.9 (L) 10/04/2016 0347   GFRNONAA >60 10/04/2016 0347   GFRAA >60 10/04/2016 0347     Assessment/Plan: 1 Day Post-Op   Principal Problem:   Hip fracture, right, closed, initial encounter (HCC)   Advance diet Up with therapy Continue plan per medicine WBAT Dry dressing PRN   Jasmine Benson, Jasmine 10/04/2016, 8:12 AM  Discussed and agree with above.   Jasmine LucyJoshua Montrice Montuori, MD Cell [phone]

## 2016-10-04 NOTE — Care Management Note (Signed)
Case Management Note  Patient Details  Name: Jasmine EmerySharron M Krotzer MRN: [id number] Date of Birth: 1945-06-27  Subjective/Objective:                    Action/Plan:  Discussed home health PT and OT and walker and 3 in 1 with patient at bedside. Provided a list of [place] agencies to patient. Confirmed address with patient and phone number. Patient states she does need walker and 3 in 1 . Patient would like more time to make decision on agency , will continue to follow.   Will need orders and face to face for HHPT/OT. Expected Discharge Date:                  Expected Discharge Plan:  Home w Home Health Services  In-House Referral:     Discharge planning Services  CM Consult  Post Acute Care Choice:  Durable Medical Equipment, Home Health Choice offered to:  Patient  DME Arranged:    DME Agency:     HH Arranged:  OT, PT HH Agency:     Status of Service:  In process, will continue to follow  If discussed at Long Length of Stay Meetings, dates discussed:    Additional Comments:  [name], Sabrea Sankey Marie, RN 10/04/2016, 12:23 PM

## 2016-10-05 ENCOUNTER — Encounter (HOSPITAL_COMMUNITY): Payer: Self-pay | Admitting: General Practice

## 2016-10-05 LAB — BASIC METABOLIC PANEL
Anion gap: 7 (ref 5–15)
BUN: 12 mg/dL (ref 6–20)
CALCIUM: 8.3 mg/dL — AB (ref 8.9–10.3)
CO2: 23 mmol/L (ref 22–32)
Chloride: 108 mmol/L (ref 101–111)
Creatinine, Ser: 0.63 mg/dL (ref 0.44–1.00)
GFR calc Af Amer: >60 mL/min (ref >60)
GLUCOSE: 90 mg/dL (ref 65–99)
Potassium: 3.4 mmol/L — ABNORMAL LOW (ref 3.5–5.1)
Sodium: 138 mmol/L (ref 135–145)

## 2016-10-05 LAB — CBC
HCT: 30.1 % — ABNORMAL LOW (ref 36.0–46.0)
Hemoglobin: 9.8 g/dL — ABNORMAL LOW (ref 12.0–15.0)
MCH: 35.6 pg — AB (ref 26.0–34.0)
MCHC: 32.6 g/dL (ref 30.0–36.0)
MCV: 109.5 fL — AB (ref 78.0–100.0)
PLATELETS: 263 10*3/uL (ref 150–400)
RBC: 2.75 MIL/uL — ABNORMAL LOW (ref 3.87–5.11)
RDW: 13.2 % (ref 11.5–15.5)
WBC: 5.5 10*3/uL (ref 4.0–10.5)

## 2016-10-05 MED ORDER — OXYCODONE-ACETAMINOPHEN 5-325 MG PO TABS
1.5000 | ORAL_TABLET | ORAL | Status: DC | PRN
  Administered 2016-10-05 (×3): 2 via ORAL
  Administered 2016-10-06 (×5): 3 via ORAL
  Administered 2016-10-07 (×2): 2 via ORAL
  Administered 2016-10-07: 3 via ORAL
  Filled 2016-10-05: qty 2
  Filled 2016-10-05 (×2): qty 3
  Filled 2016-10-05: qty 2
  Filled 2016-10-05 (×3): qty 3
  Filled 2016-10-05: qty 2
  Filled 2016-10-05: qty 3
  Filled 2016-10-05: qty 2
  Filled 2016-10-05: qty 3

## 2016-10-05 MED ORDER — POTASSIUM CHLORIDE CRYS ER 20 MEQ PO TBCR
40.0000 meq | EXTENDED_RELEASE_TABLET | ORAL | Status: AC
  Administered 2016-10-05 (×2): 40 meq via ORAL
  Filled 2016-10-05 (×2): qty 2

## 2016-10-05 MED ORDER — PNEUMOCOCCAL VAC POLYVALENT 25 MCG/0.5ML IJ INJ
0.5000 mL | INJECTION | INTRAMUSCULAR | Status: DC
  Filled 2016-10-05 (×2): qty 0.5

## 2016-10-05 NOTE — Progress Notes (Signed)
Patient ID: Jasmine EmerySharron M Boyers, female   DOB: 1945/09/12, 71 y.o.   MRN: [id number]     Subjective:  Patient reports pain as mild.  Patient reports that she would like to go to SNF  Objective:   VITALS:   Vitals:   10/04/16 1332 10/04/16 2242 10/05/16 0557 10/05/16 1253  BP: 117/66 130/74 134/73 130/60  Pulse: (!) 102 87 83 88  Resp: 18 17 17 16   Temp: 98.1 F (36.7 C) 98.2 F (36.8 C) 97.8 F (36.6 C) 97.7 F (36.5 C)  TempSrc: Oral Oral  Axillary  SpO2: 98% 94% 97% 97%  Weight:      Height:        ABD soft Sensation intact distally Dorsiflexion/Plantar flexion intact Incision: dressing C/D/I and no drainage   Lab Results  Component Value Date   WBC 5.5 10/05/2016   HGB 9.8 (L) 10/05/2016   HCT 30.1 (L) 10/05/2016   MCV 109.5 (H) 10/05/2016   PLT 263 10/05/2016   BMET    Component Value Date/Time   NA 138 10/05/2016 0530   K 3.4 (L) 10/05/2016 0530   CL 108 10/05/2016 0530   CO2 23 10/05/2016 0530   GLUCOSE 90 10/05/2016 0530   BUN 12 10/05/2016 0530   CREATININE 0.63 10/05/2016 0530   CALCIUM 8.3 (L) 10/05/2016 0530   GFRNONAA >60 10/05/2016 0530   GFRAA >60 10/05/2016 0530     Assessment/Plan: 2 Days Post-Op   Principal Problem:   Hip fracture, right, closed, initial encounter (HCC)   Advance diet Up with therapy Continue plan per traid WBAT Dry dressing PRN   Haskel KhanDOUGLAS PARRY, BRANDON 10/05/2016, 4:14 PM  Patient seen and agree with above.  Teryl LucyJoshua Rebbie Lauricella, MD Cell [phone]

## 2016-10-05 NOTE — Care Management Note (Signed)
Case Management Note  Patient Details  Name: Jasmine Benson MRN: [id number] Date of Birth: 12/12/1945  Subjective/Objective:                    Action/Plan:  Followed up with patient this morning regarding home health. Patient now would like SNF. SW aware.  Expected Discharge Date:                  Expected Discharge Plan:  Home w Home Health Services  In-House Referral:  Clinical Social Work  Discharge planning Services  CM Consult  Post Acute Care Choice:  Durable Medical Equipment, Home Health Choice offered to:  Patient  DME Arranged:    DME Agency:     HH Arranged:  OT, PT HH Agency:     Status of Service:  In process, will continue to follow  If discussed at Long Length of Stay Meetings, dates discussed:    Additional Comments:  [name], [name], RN 10/05/2016, 10:17 AM

## 2016-10-05 NOTE — Progress Notes (Signed)
Physical Therapy Treatment Patient Details Name: Jasmine Benson MRN: [id number] DOB: 1945-09-22 Today's Date: 10/05/2016    History of Present Illness 71 yo admitted after fall at home with right hip fx s/p hemiarthoplasty posterior approach with glute med tendon repair since fall in July. PMHx:     PT Comments    Upon arrival pt initially reluctant to mobilize with PT secondary to pain; however pt agrees to mobilize with PT and says "It would be good to get into the chair." Pt voices she does not feel safe returning home at this point due to living alone and thinks she will not be able to manage. Pt requesting SNF at this time. This may be appropriate due to pt's reported inability to manage at home without assistance. Pt was able to ambulate in hallway with RW; however fearful to try stairs and declined. Will continue to follow acutely for mobilization and increased activity tolerance and strengthening.    Follow Up Recommendations  SNF (pt requests SNF at this time due to living alone and no longer feels she can manage at home)     Equipment Recommendations  Rolling walker with 5" wheels;3in1 (PT)    Recommendations for Other Services       Precautions / Restrictions Precautions Precautions: Fall;Posterior Hip Precaution Comments: pt able to state 3/3 precautions Restrictions RLE Weight Bearing: Weight bearing as tolerated    Mobility  Bed Mobility Overal bed mobility: Needs Assistance Bed Mobility: Supine to Sit     Supine to sit: HOB elevated;Modified independent (Device/Increase time)     General bed mobility comments: Mod I with use of rails and HOB elevated. Pt able to maintain precautions   Transfers Overall transfer level: Needs assistance   Transfers: Sit to/from Stand Sit to Stand: Min guard         General transfer comment: Min guard for safety, No cues needed   Ambulation/Gait Ambulation/Gait assistance: Min guard Ambulation Distance (Feet):  150 Feet Assistive device: Rolling walker (2 wheeled) Gait Pattern/deviations: Step-to pattern;Decreased stride length;Decreased stance time - right;Trunk flexed Gait velocity: decreased Gait velocity interpretation: Below normal speed for age/gender General Gait Details: Min guard for safety. Cues for step through pattern, posture and decreased WB through UEs    Stairs            Wheelchair Mobility    Modified Rankin (Stroke Patients Only)       Balance Overall balance assessment: History of Falls;Needs assistance Sitting-balance support: Bilateral upper extremity supported;Feet unsupported Sitting balance-Leahy Scale: Fair     Standing balance support: Bilateral upper extremity supported;During functional activity Standing balance-Leahy Scale: Poor Standing balance comment: Requires bil UE support                             Cognition Arousal/Alertness: Awake/alert Behavior During Therapy: WFL for tasks assessed/performed Overall Cognitive Status: Within Functional Limits for tasks assessed                                        Exercises Total Joint Exercises Heel Slides: AROM;Right;Supine;10 reps Hip ABduction/ADduction: AROM;Right;Supine;10 reps Long Arc Quad: AROM;Right;Seated;10 reps    General Comments        Pertinent Vitals/Pain Pain Assessment: 0-10 Pain Score: 6  Pain Location: right hip and right chest Pain Descriptors / Indicators: Sore Pain Intervention(s): Limited activity within  patient's tolerance;Monitored during session    Home Living                      Prior Function            PT Goals (current goals can now be found in the care plan section) Acute Rehab PT Goals Patient Stated Goal: gain independence  PT Goal Formulation: With patient Time For Goal Achievement: 10/19/16 Potential to Achieve Goals: Good Progress towards PT goals: Progressing toward goals    Frequency    Min  3X/week      PT Plan Discharge plan needs to be updated;Frequency needs to be updated    Co-evaluation              AM-PAC PT "6 Clicks" Daily Activity  Outcome Measure  Difficulty turning over in bed (including adjusting bedclothes, sheets and blankets)?: A Little Difficulty moving from lying on back to sitting on the side of the bed? : A Little Difficulty sitting down on and standing up from a chair with arms (e.g., wheelchair, bedside commode, etc,.)?: A Little Help needed moving to and from a bed to chair (including a wheelchair)?: A Little Help needed walking in hospital room?: A Little Help needed climbing 3-5 steps with a railing? : A Lot 6 Click Score: 17    End of Session Equipment Utilized During Treatment: Gait belt Activity Tolerance: Patient tolerated treatment well   Nurse Communication: Mobility status;Precautions;Weight bearing status PT Visit Diagnosis: Repeated falls (R29.6);Other abnormalities of gait and mobility (R26.89);Difficulty in walking, not elsewhere classified (R26.2);Pain Pain - Right/Left: Right Pain - part of body: Hip     Time: [id number] PT Time Calculation (min) (ACUTE ONLY): 19 min  Charges:  $Gait Training: 8-22 mins                    G Codes:       Arneta ClicheVictoria Nura Cahoon, SPT Acute Rehab [phone]    Arneta ClicheVictoria Vincente Asbridge 10/05/2016, 12:02 PM

## 2016-10-05 NOTE — Progress Notes (Addendum)
PROGRESS NOTE [facility]   AMBERA FEDELE   ZOX:[id number] DOB: 04-15-1945  DOA: 10/03/2016 PCP: [facility]   Brief Narrative:  Jasmine Benson is a 71 yo F with presented with right hip pain.  She is otherwise healthy but a few weeks ago, she almost slipped on an ice cube on her floor and since that time, she has been trying to use crutches for pain in her right hip.  She had a mechanical fall that resulted in ER visit where she was found to have a right femoral neck fracture.  She underwent right hip hemiarthroplasty and right hip repair of her gluteus medius tendon on 10/03/2016.  She is doing well post-operatively, POD 2.  She lives home alone.  PT/OT recommending intermittent supervision although patient does not feels safe at home, would like to go to SNF. SW planning for placement.   Subjective:  Patient seen and examined, reports pain when working with physical therapy. Otherwise no acute events  Assessment & Plan: Closed right hip fracture Status post right hip hemiarthroplasty and right hip repair the gluteus medius tendon 10/03/16 Patient requesting increased pain medication, report Dilaudid work the best PT not recommending SNF, Child psychotherapist contacted for discharge planning WBAT DVT prophylaxis per orthopedic surgery  Anemia Postop Monitor CBC Started on iron supplements  Hypokalemia Replete Check BMP in the morning  DVT prophylaxis: Lovenox Code Status: Full code Family Communication: None at bedside Disposition Plan: SNF when bed available   Consultants:   Orthopedic surgery  Procedures:   Right hip hemiarthroplasty  Antimicrobials: Anti-infectives    Start     Dose/Rate Route Frequency Ordered Stop   10/03/16 2030  ceFAZolin (ANCEF) IVPB 2g/100 mL premix     2 g 200 mL/hr over 30 Minutes Intravenous Every 6 hours 10/03/16 1641 10/04/16 0347   10/03/16 1358  ceFAZolin (ANCEF) 2-4 GM/100ML-% IVPB    Comments:   Flores, Robert   : cabinet override      10/03/16 1358 10/04/16 0214          Objective: Vitals:   10/04/16 1332 10/04/16 2242 10/05/16 0557 10/05/16 1253  BP: 117/66 130/74 134/73 130/60  Pulse: (!) 102 87 83 88  Resp: 18 17 17 16   Temp: 98.1 F (36.7 C) 98.2 F (36.8 C) 97.8 F (36.6 C) 97.7 F (36.5 C)  TempSrc: Oral Oral  Axillary  SpO2: 98% 94% 97% 97%  Weight:      Height:        Intake/Output Summary (Last 24 hours) at 10/05/16 1755 Last data filed at 10/05/16 1619  Gross per 24 hour  Intake             1024 ml  Output             1450 ml  Net             -426 ml   Filed Weights   10/03/16 0737  Weight: 63.5 kg (140 lb)    Examination:  General: Pt is alert, awake, not in acute distress Cardiovascular: RRR, S1/S2 +, no rubs, no gallops Respiratory: CTA bilaterally, no wheezing, no rhonchi Abdominal: Soft, NT, ND, bowel sounds + Extremities: no edema, no cyanosis  Data Reviewed: I have personally reviewed following labs and imaging studies  CBC:  Recent Labs Lab 10/03/16 0808 10/03/16 2002 10/04/16 0347 10/05/16 0530  WBC 8.7 7.9 7.8 5.5  NEUTROABS 7.2  --   --   --  HGB 12.6 11.4* 10.1* 9.8*  HCT 36.3 34.0* 30.5* 30.1*  MCV 105.5* 106.6* 107.4* 109.5*  PLT 311 274 277 263   Basic Metabolic Panel:  Recent Labs Lab 10/03/16 0808 10/03/16 2002 10/04/16 0347 10/05/16 0530  NA 135  --  135 138  K 3.6  --  3.9 3.4*  CL 103  --  106 108  CO2 19*  --  20* 23  GLUCOSE 103*  --  176* 90  BUN 24*  --  10 12  CREATININE 0.68 0.60 0.64 0.63  CALCIUM 9.0  --  7.9* 8.3*   GFR: Estimated Creatinine Clearance: 61.3 mL/min (by C-G formula based on SCr of 0.63 mg/dL). Liver Function Tests:  Recent Labs Lab 10/03/16 0808  AST 26  ALT 23  ALKPHOS 84  BILITOT 0.7  PROT 7.1  ALBUMIN 3.9   No results for input(s): LIPASE, AMYLASE in the last 168 hours. No results for input(s): AMMONIA in the last 168 hours. Coagulation Profile:  Recent  Labs Lab 10/03/16 0808  INR 0.94   Cardiac Enzymes: No results for input(s): CKTOTAL, CKMB, CKMBINDEX, TROPONINI in the last 168 hours. BNP (last 3 results) No results for input(s): PROBNP in the last 8760 hours. HbA1C: No results for input(s): HGBA1C in the last 72 hours. CBG: No results for input(s): GLUCAP in the last 168 hours. Lipid Profile: No results for input(s): CHOL, HDL, LDLCALC, TRIG, CHOLHDL, LDLDIRECT in the last 72 hours. Thyroid Function Tests: No results for input(s): TSH, T4TOTAL, FREET4, T3FREE, THYROIDAB in the last 72 hours. Anemia Panel: No results for input(s): VITAMINB12, FOLATE, FERRITIN, TIBC, IRON, RETICCTPCT in the last 72 hours. Sepsis Labs: No results for input(s): PROCALCITON, LATICACIDVEN in the last 168 hours.  No results found for this or any previous visit (from the past 240 hour(s)).    Radiology Studies: No results found.    Scheduled Meds: . docusate sodium  100 mg Oral BID  . enoxaparin (LOVENOX) injection  40 mg Subcutaneous Q24H  . ferrous sulfate  325 mg Oral TID PC  . [START ON 10/06/2016] pneumococcal 23 valent vaccine  0.5 mL Intramuscular Tomorrow-1000  . senna  1 tablet Oral BID   Continuous Infusions:   LOS: 2 days    Time spent: Total of 15 minutes spent with pt, greater than 50% of which was spent in discussion of  treatment, counseling and coordination of care    Latrelle DodrillEdwin Silva, MD Pager: Text Page via [email or web address]   If 7PM-7AM, please contact night-coverage [email or web address] 10/05/2016, 5:55 PM

## 2016-10-05 NOTE — Progress Notes (Signed)
Occupational Therapy Treatment Patient Details Name: Jasmine EmerySharron M Benson MRN: [id number] DOB: 05/20/1945 Today's Date: 10/05/2016    History of present illness 71 yo admitted after fall at home with right hip fx s/p hemiarthoplasty posterior approach with glute med tendon repair since fall in July. PMHx:    OT comments  Pt making good progress overall min assist for supine to sit and sit to supine secondary to needing assist with moving the RLE out and into bed, adhering to THR precautions.  She was able to complete toilet transfer with min guard assist and LB dressing sit to stand with close supervision.  Will continue to follow, but since pt lives alone she feels short term SNF may be best option for further practice with ADLs adhering to THR precautions.  This therapist feels this will be a good option as well.    Follow Up Recommendations  SNF;Supervision/Assistance - 24 hour    Equipment Recommendations  3 in 1 bedside commode    Recommendations for Other Services      Precautions / Restrictions Precautions Precautions: Fall;Posterior Hip Precaution Comments: pt able to state 3/3 precautions Restrictions Weight Bearing Restrictions: No       Mobility Bed Mobility Overal bed mobility: Needs Assistance Bed Mobility: Supine to Sit;Sit to Supine     Supine to sit: Min assist Sit to supine: Min assist   General bed mobility comments: Pt needed assistance with moving the RLE into and out of bed with mod instructional cueing for technique to transfer to the EOB adhering to her THR precautions.   Transfers Overall transfer level: Needs assistance Equipment used: Rolling walker (2 wheeled) Transfers: Sit to/from Stand Sit to Stand: Min guard              Balance Overall balance assessment: Needs assistance Sitting-balance support: No upper extremity supported Sitting balance-Leahy Scale: Good       Standing balance-Leahy Scale: Fair Standing balance comment:  Needs use of RW for UE support.                           ADL either performed or assessed with clinical judgement   ADL Overall ADL's : Needs assistance/impaired     Grooming: Wash/dry hands;Standing;Supervision/safety               Lower Body Dressing: Supervision/safety;With adaptive equipment;Adhering to hip precautions;Sit to/from stand Lower Body Dressing Details (indicate cue type and reason): Pt able to donn pants over feet and doff as well as gripper socks with mod instructional cueing for AE use.  Toilet Transfer: RW;Ambulation;Min guard   PsychiatristToileting- Clothing Manipulation and Hygiene: Sit to/from stand;Min guard         General ADL Comments: Pt educated on AE for LB selfcare and provided handout for reference.  She is going to see if her nephew or grandson can order online, since she is discharging to SNF for further rehab.  Min guard assist for mobility to and from the bathroom using the RW and adhering to her hip precautions.                Cognition Arousal/Alertness: Awake/alert Behavior During Therapy: WFL for tasks assessed/performed Overall Cognitive Status: Within Functional Limits for tasks assessed                                 General Comments: Pt anxious somewhat but  did well during session.  Stating "Don't they know I just had surgery Sunday?"  referring to having therapy 2 time today.                   Pertinent Vitals/ Pain       Pain Assessment: 0-10 Pain Score: 4  Pain Location: right hip and right breast Pain Descriptors / Indicators: Discomfort Pain Intervention(s): Limited activity within patient's tolerance;Repositioned  Home Living Family/patient expects to be discharged to:: Skilled nursing facility Living Arrangements: Alone                                          Frequency  Min 2X/week        Progress Toward Goals  OT Goals(current goals can now be found in the care plan  section)  Progress towards OT goals: Progressing toward goals     Plan Discharge plan needs to be updated       AM-PAC PT "6 Clicks" Daily Activity     Outcome Measure   Help from another person eating meals?: None Help from another person taking care of personal grooming?: A Little Help from another person toileting, which includes using toliet, bedpan, or urinal?: A Little Help from another person bathing (including washing, rinsing, drying)?: A Little Help from another person to put on and taking off regular upper body clothing?: None Help from another person to put on and taking off regular lower body clothing?: A Little 6 Click Score: 20    End of Session Equipment Utilized During Treatment: Rolling walker  OT Visit Diagnosis: Muscle weakness (generalized) (M62.81);History of falling (Z91.81) Pain - Right/Left: Right Pain - part of body: Hip   Activity Tolerance Patient tolerated treatment well   Patient Left in bed;with call bell/phone within reach   Nurse Communication Mobility status        Time: 1610-9604 OT Time Calculation (min): 30 min  Charges: OT General Charges $OT Visit: 1 Procedure OT Treatments $Self Care/Home Management : 23-37 mins    Chey Rachels OTR/L 10/05/2016, 4:02 PM

## 2016-10-06 DIAGNOSIS — Z967 Presence of other bone and tendon implants: Secondary | ICD-10-CM

## 2016-10-06 DIAGNOSIS — E876 Hypokalemia: Secondary | ICD-10-CM

## 2016-10-06 DIAGNOSIS — Z8781 Personal history of (healed) traumatic fracture: Secondary | ICD-10-CM

## 2016-10-06 DIAGNOSIS — S72001A Fracture of unspecified part of neck of right femur, initial encounter for closed fracture: Secondary | ICD-10-CM

## 2016-10-06 LAB — RETICULOCYTES
RBC.: 2.73 MIL/uL — ABNORMAL LOW (ref 3.87–5.11)
RETIC CT PCT: 1.9 % (ref 0.4–3.1)
Retic Count, Absolute: 51.9 10*3/uL (ref 19.0–186.0)

## 2016-10-06 LAB — IRON AND TIBC
IRON: 17 ug/dL — AB (ref 28–170)
SATURATION RATIOS: 7 % — AB (ref 10.4–31.8)
TIBC: 239 ug/dL — AB (ref 250–450)
UIBC: 222 ug/dL

## 2016-10-06 LAB — BASIC METABOLIC PANEL
ANION GAP: 5 (ref 5–15)
BUN: 13 mg/dL (ref 6–20)
CHLORIDE: 108 mmol/L (ref 101–111)
CO2: 24 mmol/L (ref 22–32)
Calcium: 8.4 mg/dL — ABNORMAL LOW (ref 8.9–10.3)
Creatinine, Ser: 0.59 mg/dL (ref 0.44–1.00)
GFR calc Af Amer: >60 mL/min (ref >60)
GFR calc non Af Amer: >60 mL/min (ref >60)
GLUCOSE: 87 mg/dL (ref 65–99)
POTASSIUM: 4.7 mmol/L (ref 3.5–5.1)
SODIUM: 137 mmol/L (ref 135–145)

## 2016-10-06 LAB — CBC
HCT: 29.6 % — ABNORMAL LOW (ref 36.0–46.0)
HEMOGLOBIN: 9.7 g/dL — AB (ref 12.0–15.0)
MCH: 35.5 pg — ABNORMAL HIGH (ref 26.0–34.0)
MCHC: 32.8 g/dL (ref 30.0–36.0)
MCV: 108.4 fL — AB (ref 78.0–100.0)
Platelets: 277 10*3/uL (ref 150–400)
RBC: 2.73 MIL/uL — AB (ref 3.87–5.11)
RDW: 13.2 % (ref 11.5–15.5)
WBC: 7.3 10*3/uL (ref 4.0–10.5)

## 2016-10-06 LAB — FOLATE: Folate: 5.3 ng/mL — ABNORMAL LOW (ref >5.9)

## 2016-10-06 LAB — FERRITIN: Ferritin: 237 ng/mL (ref 11–307)

## 2016-10-06 LAB — VITAMIN B12: VITAMIN B 12: 128 pg/mL — AB (ref 180–914)

## 2016-10-06 MED ORDER — IBUPROFEN 400 MG PO TABS
400.0000 mg | ORAL_TABLET | Freq: Four times a day (QID) | ORAL | Status: DC | PRN
  Administered 2016-10-06: 400 mg via ORAL
  Filled 2016-10-06: qty 1

## 2016-10-06 NOTE — Clinical Social Work Note (Signed)
Clinical Social Work Assessment  Patient Details  Name: Jasmine Benson MRN: [id number] Date of Birth: 07-Sep-1945  Date of referral:  10/06/16               Reason for consult:  Discharge Planning                Permission sought to share information with:  Family Supports Permission granted to share information::     Name::     Doreene Burke  Agency::  SNFs  Relationship::  Sister  Contact Information:  [phone]  Housing/Transportation Living arrangements for the past 2 months:  Single Family Home Source of Information:  Patient Patient Interpreter Needed:  None Criminal Activity/Legal Involvement Pertinent to Current Situation/Hospitalization:  No - Comment as needed Significant Relationships:  Siblings Lives with:  Self Do you feel safe going back to the place where you live?  Yes Need for family participation in patient care:  No (Coment)  Care giving concerns:  No care giving concerns identified.    Social Worker assessment / plan:  CSW met with pt to address consult for new SNF. CSW introduced self and explained role of social work. Pt from home and lives alone. P/T is recommending STR at SNF. CSW provided SNF listing for review. Pt agreeable to SNF for STR. Pt prefers [facility], but agreed for CSW to fax out to [facility].   CSW sent FL-2 to SNFs and pt received offer from [facility]. CSW confirmed with pt [facility] is [facility] choice. CSW communicated with Kirstin from [facility] and requested she start [facility] for pt. CSW will continue to follow for discharge needs.   Employment status:  Retired Forensic scientist:  Office manager) PT Recommendations:  [place] / Referral to community resources:  [place]  Patient/Family's Response to care:  Pt responded well to care and is appreciative of CSW support and guidance.   Patient/Family's Understanding of and Emotional Response to Diagnosis, Current  Treatment, and Prognosis:  Pt expressed understanding of current medical state.  Emotional Assessment Appearance:  Appears stated age Attitude/Demeanor/Rapport:  Other (Appropriate) Affect (typically observed):  Accepting, Adaptable, Pleasant Orientation:  Oriented to Self, Oriented to Place, Oriented to  Time, Oriented to Situation Alcohol / Substance use:  Other Psych involvement (Current and /or in the community):  No (Comment)  Discharge Needs  Concerns to be addressed:  Discharge Planning Concerns Readmission within the last 30 days:  No Current discharge risk:  Dependent with Mobility, Lives alone Barriers to Discharge:  Continued Medical Work up   Truitt Merle, LCSW 10/06/2016, 5:52 PM

## 2016-10-06 NOTE — Progress Notes (Signed)
     Subjective:  Patient reports pain as mild.  She is depressed as she has begun to understand the significance of her injury.  Objective:   VITALS:   Vitals:   10/05/16 0557 10/05/16 1253 10/05/16 2037 10/06/16 0508  BP: 134/73 130/60 (!) 143/74 117/74  Pulse: 83 88 95 96  Resp: 17 16 16 16   Temp: 97.8 F (36.6 C) 97.7 F (36.5 C) 98 F (36.7 C) 98.4 F (36.9 C)  TempSrc:  Axillary Oral Oral  SpO2: 97% 97% 100% 100%  Weight:      Height:        Neurologically intact Dorsiflexion/Plantar flexion intact Incision: dressing C/D/I and no drainage Cognition intact.  Lab Results  Component Value Date   WBC 7.3 10/06/2016   HGB 9.7 (L) 10/06/2016   HCT 29.6 (L) 10/06/2016   MCV 108.4 (H) 10/06/2016   PLT 277 10/06/2016   BMET    Component Value Date/Time   NA 137 10/06/2016 0212   K 4.7 10/06/2016 0212   CL 108 10/06/2016 0212   CO2 24 10/06/2016 0212   GLUCOSE 87 10/06/2016 0212   BUN 13 10/06/2016 0212   CREATININE 0.59 10/06/2016 0212   CALCIUM 8.4 (L) 10/06/2016 0212   GFRNONAA >60 10/06/2016 0212   GFRAA >60 10/06/2016 0212     Assessment/Plan: 3 Days Post-Op   Principal Problem:   Hip fracture, right, closed, initial encounter (HCC)   Advance diet Up with therapy Discharge planning according to primary team, skilled nursing versus home. I'm not sure she has enough support at home, and may require a short skilled nursing stay. Anticoagulation for 3 weeks, weightbearing as tolerated right lower extremity, follow-up with me in 2 weeks.   Tibor Lemmons P 10/06/2016, 7:30 AM   Teryl LucyJoshua Alithea Lapage, MD Cell [phone]

## 2016-10-06 NOTE — [place] FL2 (Signed)
[place] MEDICAID FL2 LEVEL OF CARE SCREENING TOOL     IDENTIFICATION  Patient Name: Jasmine Benson Birthdate: March 01, 1945 Sex: female Admission Date (Current Location): 10/03/2016  [facility] [place]   Facility and Address:  [facility] [place], [address], [place], [place] [address]      Provider Number: [id number]  Attending Physician Name and Address:  Coralie KeensArrien, Kervens [employer],*  Relative Name and Phone Number:       Current Level of Care: Hospital Recommended Level of Care: Skilled Nursing Facility Prior Approval Number:    Date Approved/Denied:   PASRR Number: [id number]  Discharge Plan: SNF    Current Diagnoses: Patient Active Problem List   Diagnosis Date Noted  . Hip fracture, right, closed, initial encounter (HCC) 10/03/2016    Orientation RESPIRATION BLADDER Height & Weight     Self, Time, Situation, Place  Normal Continent Weight: 140 lb (63.5 kg) Height:  5\' 6"  (167.6 cm)  BEHAVIORAL SYMPTOMS/MOOD NEUROLOGICAL BOWEL NUTRITION STATUS      Continent  (Please see d/c summary)  AMBULATORY STATUS COMMUNICATION OF NEEDS Skin   Limited Assist Verbally Surgical wounds (Closed incision right hip, mepliex dressing)                       Personal Care Assistance Level of Assistance  Bathing, Feeding, Dressing Bathing Assistance: Limited assistance Feeding assistance: Independent Dressing Assistance: Limited assistance     Functional Limitations Info  Sight, Hearing, Speech Sight Info: Adequate Hearing Info: Adequate Speech Info: Adequate    SPECIAL CARE FACTORS FREQUENCY  PT (By licensed PT), OT (By licensed OT)     PT Frequency: 3x week OT Frequency: 3x week            Contractures Contractures Info: Not present    Additional Factors Info  Code Status, Allergies Code Status Info: Full Code Allergies Info: No known allergies           Current Medications (10/06/2016):  This is the  current hospital active medication list Current Facility-Administered Medications  Medication Dose Route Frequency Provider Last Rate Last Dose  . acetaminophen (TYLENOL) tablet 650 mg  650 mg Oral Q6H PRN Teryl LucyLandau, Joshua, MD       Or  . acetaminophen (TYLENOL) suppository 650 mg  650 mg Rectal Q6H PRN Teryl LucyLandau, Joshua, MD      . alum & mag hydroxide-simeth (MAALOX/MYLANTA) 200-200-20 MG/5ML suspension 30 mL  30 mL Oral Q4H PRN Teryl LucyLandau, Joshua, MD      . bisacodyl (DULCOLAX) suppository 10 mg  10 mg Rectal Daily PRN Teryl LucyLandau, Joshua, MD      . docusate sodium (COLACE) capsule 100 mg  100 mg Oral BID Teryl LucyLandau, Joshua, MD   100 mg at 10/05/16 2140  . enoxaparin (LOVENOX) injection 40 mg  40 mg Subcutaneous Q24H Teryl LucyLandau, Joshua, MD   40 mg at 10/06/16 [id number]  . ferrous sulfate tablet 325 mg  325 mg Oral TID [facility] Landau, Joshua, MD   325 mg at 10/06/16 [id number]  . magnesium citrate solution 1 Bottle  1 Bottle Oral Once PRN Teryl LucyLandau, Joshua, MD      . menthol-cetylpyridinium (CEPACOL) lozenge 3 mg  1 lozenge Oral PRN Teryl LucyLandau, Joshua, MD       Or  . phenol (CHLORASEPTIC) mouth spray 1 spray  1 spray Mouth/Throat PRN Teryl LucyLandau, Joshua, MD      . ondansetron [facility](ZOFRAN) tablet 4 mg  4 mg Oral Q6H  PRN Teryl Lucy, MD       Or  . ondansetron [facility]) injection 4 mg  4 mg Intravenous Q6H PRN Teryl Lucy, MD      . oxyCODONE-acetaminophen (PERCOCET/ROXICET) 5-325 MG per tablet 1.5-3 tablet  1.5-3 tablet Oral Q4H PRN Randel Pigg, Dorma Russell, MD   3 tablet at 10/06/16 [phone]  . pneumococcal 23 valent vaccine (PNU-IMMUNE) injection 0.5 mL  0.5 mL Intramuscular Tomorrow-1000 Randel Pigg, Edwin, MD      . polyethylene glycol (MIRALAX / GLYCOLAX) packet 17 g  17 g Oral Daily PRN Teryl Lucy, MD      . senna Mancel Parsons) tablet 8.6 mg  1 tablet Oral BID Teryl Lucy, MD   8.6 mg at 10/05/16 2140     Discharge Medications: Please see discharge summary for a list of discharge medications.  Relevant Imaging Results:  Relevant Lab  Results:   Additional Information SSN: [id number]  Maree Krabbe, LCSW

## 2016-10-06 NOTE — Discharge Summary (Addendum)
Physician Discharge Summary  Jasmine Benson ZOX:[id number] DOB: 10-26-1945 DOA: 10/03/2016  PCP: [facility]  Admit date: 10/03/2016 Discharge date: 10/08/2016  Admitted From: Home  Disposition:  SNF  Recommendations for Outpatient Follow-up:  1. Follow up with PCP in 1 weeks 2. Will need DVT prophylactic anticoagulation with enoxaparin for 3 weeks 3. Weightbearing as tolerated on the right lower extremity 4. Follow up with Orthopedics in 2 weeks, Dr Dion Saucier.   Home Health: NA Equipment/Devices: NA  Discharge Condition: Stable  CODE STATUS: Full  Diet recommendation: Regular  Brief/Interim Summary: 71 year old female who presented with the chief complain of  right hip pain. Patient had a mechanical fall, developed significant pain and ambulatory dysfunction. On the initial physical examination she was found to be hemodynamically stable, her lungs were clear to auscultation, heart S1-S2 present rhythmic, her abdomen was soft nontender, no lower extremity edema, she had positive pain to palpation at her hips bilaterally. Sodium 135, potassium 3.6, chloride 103, bicarbonate 19, glucose 103, BUN 24, creatinine 0.68, white count 8.7, hemoglobin 12.6, hematocrit 36.3, platelets 311. Her chest x-ray was negative for infiltrates. EKG normal sinus rhythm. Right hip films with subcapital right femoral fracture.   Patient was admitted to the hospital with the working diagnosis of right subcapital femoral fracture.  1. Right subcapital femoral fracture. Patient underwent right hemiarthroplasty, right hip repair of the gluteus medius tendon, with no major complications. Pain well controlled. Patient was seen by physical therapy, recommendations to continue therapy, due to patient's fragility decision was made to discharge patient to skilled nursing facility to optimize her recovery through physical therapy. Will need to continue DVT prophylaxis for the next 3 weeks, weightbearing  as tolerated on right lower extremity. Follow-up with orthopedics in 2 weeks.   2. Hypokalemia. Patient received potassium supplements with improvement of electrolytes. Discharge potassium 4.7.  3.  Iron deficiency anemia with anemia of chronic disease. Serum iron was 17, TIBC 239, transferrin saturation 7, ferritin 237, she has been placed on iron supplements, check iron panel as an outpatient.    Discharge Diagnoses:  Principal Problem:   Hip fracture, right, closed, initial encounter [facility])    Discharge Instructions  Discharge Instructions    Diet - low sodium heart healthy    Complete by:  As directed    Discharge instructions    Complete by:  As directed    Please follow up with primary care in 7 days.   Increase activity slowly    Complete by:  As directed    Weight bearing as tolerated    Complete by:  As directed      Allergies as of 10/08/2016   No Known Allergies     Medication List    STOP taking these medications   ALEVE PM 220-25 MG Tabs Generic drug:  Naproxen Sod-Diphenhydramine   pseudoephedrine 30 MG tablet Commonly known as:  SUDAFED     TAKE these medications   baclofen 10 MG tablet Commonly known as:  LIORESAL Take 1 tablet (10 mg total) by mouth 3 (three) times daily. As needed for muscle spasm   enoxaparin 40 MG/0.4ML injection Commonly known as:  LOVENOX Inject 0.4 mLs (40 mg total) into the skin daily.   ferrous sulfate 325 (65 FE) MG tablet Take 1 tablet (325 mg total) by mouth 2 (two) times daily with a meal.   ibuprofen 200 MG tablet Commonly known as:  MOTRIN IB Take 1 tablet (200 mg total) by mouth  every 6 (six) hours as needed for headache or moderate pain. What changed:  how much to take  reasons to take this   ondansetron 4 MG tablet Commonly known as:  ZOFRAN Take 1 tablet (4 mg total) by mouth every 8 (eight) hours as needed for nausea or vomiting.   oxyCODONE-acetaminophen 10-325 MG tablet Commonly known as:   PERCOCET Take 1-2 tablets by mouth every 6 (six) hours as needed for pain. MAXIMUM TOTAL ACETAMINOPHEN DOSE IS 4000 MG PER DAY   sennosides-docusate sodium 8.6-50 MG tablet Commonly known as:  SENOKOT-S Take 2 tablets by mouth daily.            Durable Medical Equipment        Start     Ordered   10/04/16 1227  For home use only DME 3 n 1  Once     10/04/16 1226   10/04/16 1226  For home use only DME Walker rolling  Once    Question:  Patient needs a walker to treat with the following condition  Answer:  Hip fracture, right, closed, initial encounter (HCC)   10/04/16 1226      Contact information for follow-up providers    Teryl Lucy, MD. Schedule an appointment as soon as possible for a visit in 2 week(s).   Specialty:  Orthopedic Surgery Contact information: [address] [place] [address] [phone]        [facility] Follow up in 1 week(s).   Contact information: [address] [place] [address] [phone]            Contact information for after-discharge care    Destination    [facility] Follow up.   Specialty:  Skilled Nursing Facility Contact information: [address] [place] [address] [phone]                 No Known Allergies  Consultations:  Orthopedics   Procedures/Studies: Dg Chest 1 View  Result Date: 10/03/2016 CLINICAL DATA:  Recent fall with right femoral fracture EXAM: CHEST 1 VIEW COMPARISON:  None. FINDINGS: Cardiac shadow is within normal limits. Calcified breast implants are noted. The lungs are well-aerated without focal infiltrate. No acute bony abnormality is seen. IMPRESSION: No active disease. Electronically Signed   By: Alcide Clever M.D.   On: 10/03/2016 09:20   Dg Hip Port Unilat With Pelvis 1v Right  Result Date: 10/03/2016 CLINICAL DATA:  Hip fracture. EXAM: DG HIP (WITH OR WITHOUT PELVIS) 1V PORT RIGHT COMPARISON:   Radiographs from earlier today. 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 The femoral component is well seated without complicating features. The pubic symphysis, pubic bones and left hip joint are maintained. 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 Electronically Signed   By: Rudie Meyer M.D.   On: 10/03/2016 17:46   Dg Hip Unilat  With Pelvis 2-3 Views Right  Result Date: 10/03/2016 CLINICAL DATA:  Recent slip and fall with right hip pain, initial encounter EXAM: DG HIP (WITH OR WITHOUT PELVIS) 2-3V RIGHT COMPARISON:  None. FINDINGS: There is a subcapital femoral neck fracture with impaction and angulation at the fracture site. Pelvic ring is intact. The proximal femur is otherwise within normal limits. IMPRESSION: Subcapital right femoral neck fracture Electronically Signed   By: Alcide Clever M.D.   On: 10/03/2016 09:19     Subjective: Patient feeling better, no nausea or vomiting, tolerating  po well.   Discharge Exam: Vitals:   10/07/16 2115 10/08/16 0510  BP: (!) 120/50 127/67  Pulse: 88 99  Resp: 18 18  Temp: 98.8 F (37.1 C) 98.6 F (37 C)  SpO2: 100% 97%   Vitals:   10/07/16 0500 10/07/16 1413 10/07/16 2115 10/08/16 0510  BP: (!) 134/54 118/63 (!) 120/50 127/67  Pulse: 96 95 88 99  Resp: 18 18 18 18   Temp: 98.4 F (36.9 C) 98 F (36.7 C) 98.8 F (37.1 C) 98.6 F (37 C)  TempSrc: Oral Oral Oral   SpO2: 100% 100% 100% 97%  Weight:      Height:        General: Pt is alert, awake, not in acute distress E ENT. No pallor or icterus, oral mucosa moist.  Cardiovascular: RRR, S1/S2 +, no rubs, no gallops Respiratory: CTA bilaterally, no wheezing, no rhonchi Abdominal: Soft, NT, ND, bowel sounds + Extremities: no edema, no cyanosis    The results of significant diagnostics from this hospitalization (including imaging, microbiology, ancillary and laboratory) are listed below for reference.      Microbiology: No results found for this or any previous visit (from the past 240 hour(s)).   Labs: BNP (last 3 results) No results for input(s): BNP in the last 8760 hours. Basic Metabolic Panel:  Recent Labs Lab 10/03/16 0808 10/03/16 2002 10/04/16 0347 10/05/16 0530 10/06/16 0212  NA 135  --  135 138 137  K 3.6  --  3.9 3.4* 4.7  CL 103  --  106 108 108  CO2 19*  --  20* 23 24  GLUCOSE 103*  --  176* 90 87  BUN 24*  --  10 12 13   CREATININE 0.68 0.60 0.64 0.63 0.59  CALCIUM 9.0  --  7.9* 8.3* 8.4*   Liver Function Tests:  Recent Labs Lab 10/03/16 0808  AST 26  ALT 23  ALKPHOS 84  BILITOT 0.7  PROT 7.1  ALBUMIN 3.9   No results for input(s): LIPASE, AMYLASE in the last 168 hours. No results for input(s): AMMONIA in the last 168 hours. CBC:  Recent Labs Lab 10/03/16 0808 10/03/16 2002 10/04/16 0347 10/05/16 0530 10/06/16 0212  WBC 8.7 7.9 7.8 5.5 7.3  NEUTROABS 7.2  --   --   --   --   HGB 12.6 11.4* 10.1* 9.8* 9.7*  HCT 36.3 34.0* 30.5* 30.1* 29.6*  MCV 105.5* 106.6* 107.4* 109.5* 108.4*  PLT 311 274 277 263 277   Cardiac Enzymes: No results for input(s): CKTOTAL, CKMB, CKMBINDEX, TROPONINI in the last 168 hours. BNP: Invalid input(s): POCBNP CBG: No results for input(s): GLUCAP in the last 168 hours. D-Dimer No results for input(s): DDIMER in the last 72 hours. Hgb A1c No results for input(s): HGBA1C in the last 72 hours. Lipid Profile No results for input(s): CHOL, HDL, LDLCALC, TRIG, CHOLHDL, LDLDIRECT in the last 72 hours. Thyroid function studies No results for input(s): TSH, T4TOTAL, T3FREE, THYROIDAB in the last 72 hours.  Invalid input(s): FREET3 Anemia work up  [employer]  10/06/16 0212  VITAMINB12 128*  FOLATE 5.3*  FERRITIN 237  TIBC 239*  IRON 17*  RETICCTPCT 1.9   Urinalysis No results found for: COLORURINE, APPEARANCEUR, LABSPEC, PHURINE, GLUCOSEU, HGBUR, BILIRUBINUR, KETONESUR, PROTEINUR, UROBILINOGEN, NITRITE,  LEUKOCYTESUR Sepsis Labs Invalid input(s): PROCALCITONIN,  WBC,  LACTICIDVEN Microbiology No results found for this or any previous visit (from the past 240 hour(s)).   Time coordinating discharge: 45 minutes  SIGNED:  Eulas PostLANDAU,Hulet Ehrmann P, MD  [facility] 10/08/2016, 7:33 AM Pager   If 7PM-7AM, please contact night-coverage [email or web address] Password [email or web address]

## 2016-10-06 NOTE — Clinical Social Work Note (Signed)
CSW attempted to meet with pt at bedside to discuss SNF consult. Pt asked that CSW come back later after pt has been able to use the bathroom. CSW will reattempt later today.   Corlis HoveJeneya Gustava Berland, LCSWA, LCASA Clinical Social Work [phone]

## 2016-10-06 NOTE — Progress Notes (Signed)
PROGRESS NOTE    Erskine EmerySharron M Cruickshank  AVW:[id number] DOB: [birth year] DOA: 10/03/2016 [facility]     Brief Narrative:  71 year old female who percent of the right hip pain.patient had a mechanical fall, developed significant pain and ambulatory dysfunction. On the initial physical examination she was found to be hemodynamically stable, er lungs are clear to auscultation, heart S1-S2 present rhythmic her soft nontender, no lower extremity edema, she had positive painto palpation at her hips bilaterally. Sodium 135, potassium 3.6, chloride 103, bicarbonate 19, glucose 103, BUN 24, creatinine 0.68, white count 8.7, hemoglobin 12.6, hematocrit 36.3, platelets 311. Her chest x-ray was negative for infiltrates. EKG normal sinus rhythm. Right hip films with subcapital right femoral fracture.   Patient was admitted to the hospital with the working diagnosis of right subcapital femoral fracture.  Assessment & Plan:   Principal Problem:   Hip fracture, right, closed, initial encounter (HCC)    1. Right subcapital femoral fracture. Patient underwent right hemiarthroplasty, right hip repair of the gluteus medius tendon, with no major complications. Pain well controlled. Patient was seen by physical therapy, recommendations to continue therapy, due to patient's fragility decision was made to discharge patient to skilled nursing facility to optimize her recovery through physical therapy. Will need to continue DVT prophylaxis for the next 3 weeks, weightbearing as tolerated on right lower extremity. Follow-up with orthopedics in 2 weeks.   2. Hypokalemia. Patient received potassium supplements with improvement of electrolytes. Discharge potassium 4.7.  3.  Iron deficiency anemia with anemia of chronic disease. Serum iron was 17, TIBC 239, transferrin saturation 7, ferritin 237, she has been placed on iron supplements, check iron panel as an outpatient.    DVT prophylaxis:  enoxaparin  Code Status: full  Family Communication: no family at the bedside  Disposition Plan: SNF   Consultants:   Orthopedics   Procedures:  right hemiarthroplasty, right hip repair of the gluteus medius tendon, with no major complications.  Antimicrobials:    Subjective: Positive headache, moderate, at the left side, no radiation, no improving or worsening factors, no associated symptoms.   Objective: Vitals:   10/05/16 0557 10/05/16 1253 10/05/16 2037 10/06/16 0508  BP: 134/73 130/60 (!) 143/74 117/74  Pulse: 83 88 95 96  Resp: 17 16 16 16   Temp: 97.8 F (36.6 C) 97.7 F (36.5 C) 98 F (36.7 C) 98.4 F (36.9 C)  TempSrc:  Axillary Oral Oral  SpO2: 97% 97% 100% 100%  Weight:      Height:        Intake/Output Summary (Last 24 hours) at 10/06/16 1259 Last data filed at 10/06/16 1251  Gross per 24 hour  Intake             1062 ml  Output             1150 ml  Net              -88 ml   Filed Weights   10/03/16 0737  Weight: 63.5 kg (140 lb)    Examination:  General exam: not in pain.  E ENT. No pallor or icterus, oral mucosa moist.   Respiratory system: Clear to auscultation. Respiratory effort normal. Cardiovascular system: S1 & S2 heard, RRR. No JVD, murmurs, rubs, gallops or clicks. No pedal edema. Gastrointestinal system: Abdomen is nondistended, soft and nontender. No organomegaly or masses felt. Normal bowel sounds heard. Central nervous system: Alert and oriented. No focal neurological deficits. Extremities: Symmetric 5 x  5 power. Skin: No rashes, lesions or ulcers   Data Reviewed: I have personally reviewed following labs and imaging studies  CBC:  Recent Labs Lab 10/03/16 0808 10/03/16 2002 10/04/16 0347 10/05/16 0530 10/06/16 0212  WBC 8.7 7.9 7.8 5.5 7.3  NEUTROABS 7.2  --   --   --   --   HGB 12.6 11.4* 10.1* 9.8* 9.7*  HCT 36.3 34.0* 30.5* 30.1* 29.6*  MCV 105.5* 106.6* 107.4* 109.5* 108.4*  PLT 311 274 277 263 277   Basic  Metabolic Panel:  Recent Labs Lab 10/03/16 0808 10/03/16 2002 10/04/16 0347 10/05/16 0530 10/06/16 0212  NA 135  --  135 138 137  K 3.6  --  3.9 3.4* 4.7  CL 103  --  106 108 108  CO2 19*  --  20* 23 24  GLUCOSE 103*  --  176* 90 87  BUN 24*  --  10 12 13   CREATININE 0.68 0.60 0.64 0.63 0.59  CALCIUM 9.0  --  7.9* 8.3* 8.4*   GFR: Estimated Creatinine Clearance: 61.3 mL/min (by C-G formula based on SCr of 0.59 mg/dL). Liver Function Tests:  Recent Labs Lab 10/03/16 0808  AST 26  ALT 23  ALKPHOS 84  BILITOT 0.7  PROT 7.1  ALBUMIN 3.9   No results for input(s): LIPASE, AMYLASE in the last 168 hours. No results for input(s): AMMONIA in the last 168 hours. Coagulation Profile:  Recent Labs Lab 10/03/16 0808  INR 0.94   Cardiac Enzymes: No results for input(s): CKTOTAL, CKMB, CKMBINDEX, TROPONINI in the last 168 hours. BNP (last 3 results) No results for input(s): PROBNP in the last 8760 hours. HbA1C: No results for input(s): HGBA1C in the last 72 hours. CBG: No results for input(s): GLUCAP in the last 168 hours. Lipid Profile: No results for input(s): CHOL, HDL, LDLCALC, TRIG, CHOLHDL, LDLDIRECT in the last 72 hours. Thyroid Function Tests: No results for input(s): TSH, T4TOTAL, FREET4, T3FREE, THYROIDAB in the last 72 hours. Anemia Panel:  Recent Labs  10/06/16 0212  VITAMINB12 128*  FOLATE 5.3*  FERRITIN 237  TIBC 239*  IRON 17*  RETICCTPCT 1.9   Sepsis Labs: No results for input(s): PROCALCITON, LATICACIDVEN in the last 168 hours.  No results found for this or any previous visit (from the past 240 hour(s)).       Radiology Studies: No results found.      Scheduled Meds: . docusate sodium  100 mg Oral BID  . enoxaparin (LOVENOX) injection  40 mg Subcutaneous Q24H  . ferrous sulfate  325 mg Oral TID PC  . pneumococcal 23 valent vaccine  0.5 mL Intramuscular Tomorrow-1000  . senna  1 tablet Oral BID   Continuous Infusions:   LOS: 3  days      Braydon Kullman Annett Gula, MD [facility] Pager [phone]  If 7PM-7AM, please contact night-coverage [email or web address] Password [facility] 10/06/2016, 12:59 PM

## 2016-10-07 MED ORDER — IBUPROFEN 200 MG PO TABS: 200.0000 mg | ORAL_TABLET | Freq: Four times a day (QID) | ORAL | 0 refills | Status: AC | PRN

## 2016-10-07 MED ORDER — DOCUSATE SODIUM 100 MG PO CAPS: 100.0000 mg | ORAL_CAPSULE | Freq: Two times a day (BID) | ORAL | 0 refills | Status: DC

## 2016-10-07 MED ORDER — OXYCODONE-ACETAMINOPHEN 5-325 MG PO TABS
2.0000 | ORAL_TABLET | ORAL | Status: DC | PRN
  Administered 2016-10-07 – 2016-10-12 (×26): 2 via ORAL
  Filled 2016-10-07 (×26): qty 2

## 2016-10-07 MED ORDER — OXYCODONE-ACETAMINOPHEN 5-325 MG PO TABS: 1.0000 | ORAL_TABLET | ORAL | Status: DC | PRN

## 2016-10-07 MED ORDER — FERROUS SULFATE 325 (65 FE) MG PO TABS: 325.0000 mg | ORAL_TABLET | Freq: Two times a day (BID) | ORAL | 0 refills | Status: DC

## 2016-10-07 NOTE — Care Management Important Message (Signed)
Important Message  Patient Details  Name: Jasmine EmerySharron M Bernards MRN: [id number] Date of Birth: 1945-11-24   Medicare Important Message Given:  Yes    Caesar Mannella Stefan ChurchBratton 10/07/2016, 11:42 AM

## 2016-10-07 NOTE — Clinical Social Work Note (Signed)
CSW confirmed with Kirstin at [facility] that pt offered a bed. CSW send d/c summary and order. [facility] still awaiting insurance auth. CSW continuing to follow for discharge needs.   Corlis HoveJeneya Avonte Benson, LCSWA, LCASA Clinical Social Work [phone]

## 2016-10-07 NOTE — Progress Notes (Signed)
Patient ID: Jasmine Benson, female   DOB: Jun 08, 1945, 71 y.o.   MRN: [id number]     Subjective:  Patient reports pain as mild.  Patient reports that her pain medicine has been changed and that she is not happy about that  Objective:   VITALS:   Vitals:   10/06/16 1457 10/06/16 2059 10/07/16 0500 10/07/16 1413  BP: (!) 113/56 123/70 (!) 134/54 118/63  Pulse: 95 87 96 95  Resp: 16 19 18 18   Temp: 98.4 F (36.9 C) 98.1 F (36.7 C) 98.4 F (36.9 C) 98 F (36.7 C)  TempSrc: Oral Oral Oral Oral  SpO2: 100% 100% 100% 100%  Weight:      Height:        ABD soft Sensation intact distally Dorsiflexion/Plantar flexion intact Incision: dressing C/D/I and no drainage   Lab Results  Component Value Date   WBC 7.3 10/06/2016   HGB 9.7 (L) 10/06/2016   HCT 29.6 (L) 10/06/2016   MCV 108.4 (H) 10/06/2016   PLT 277 10/06/2016   BMET    Component Value Date/Time   NA 137 10/06/2016 0212   K 4.7 10/06/2016 0212   CL 108 10/06/2016 0212   CO2 24 10/06/2016 0212   GLUCOSE 87 10/06/2016 0212   BUN 13 10/06/2016 0212   CREATININE 0.59 10/06/2016 0212   CALCIUM 8.4 (L) 10/06/2016 0212   GFRNONAA >60 10/06/2016 0212   GFRAA >60 10/06/2016 0212     Assessment/Plan: 4 Days Post-Op   Principal Problem:   Hip fracture, right, closed, initial encounter (HCC)   Advance diet Up with therapy Continue pain regimen with one to two percocet q 4 WBAT Dry dressing PRN   Torrie MayersDOUGLAS PARRY, BRANDON 10/07/2016, 6:43 PM  Discussed and agree with above.  Increased percocet to 10/325.  She was on dilaudid, and is barely tolerating the 5/325 2 every 4, which is too much tylenol.    Teryl LucyJoshua Camreigh Michie, MD Cell [phone]

## 2016-10-07 NOTE — Progress Notes (Signed)
Physical Therapy Treatment Patient Details Name: Jasmine EmerySharron M Benson MRN: [id number] DOB: August 09, 1945 Today's Date: 10/07/2016    History of Present Illness 71 yo admitted after fall at home with right hip fx s/p hemiarthoplasty posterior approach with glute med tendon repair since fall in July. PMHx:     PT Comments    Pt's ambulation distance limited today secondary to pain. Pt requires min guard for all mobilities; however pt required increased time and is heavily reliant on bed rails and arm rests to mobilize. Stairs not attempted at this time secondary to increased pain. Pt is very concerned about safety climbing stairs. Continuing to recommend SNF for placement at this time. Will continue to follow acutely to increase activity tolerance and functional independence.    Follow Up Recommendations  SNF (pt requests SNF at this time due to living alone and no longer feels she can manage at home)     Equipment Recommendations  Rolling walker with 5" wheels;3in1 (PT)    Recommendations for Other Services OT consult     Precautions / Restrictions Precautions Precautions: Fall;Posterior Hip Precaution Booklet Issued: Yes (comment) Precaution Comments: pt able to state 3/3 precautions Restrictions Weight Bearing Restrictions: No RLE Weight Bearing: Weight bearing as tolerated    Mobility  Bed Mobility Overal bed mobility: Needs Assistance Bed Mobility: Supine to Sit;Sit to Supine     Supine to sit: Min guard     General bed mobility comments: Pt required increased time for bed mobilities. Good technique using bed rails and HOB elevated to progress LE and hips EOB and elevate trunk.  Transfers Overall transfer level: Needs assistance Equipment used: Rolling walker (2 wheeled) Transfers: Sit to/from Stand Sit to Stand: Min guard         General transfer comment: Use of arm rests to power up into standing. No physical assist required. Min guard for  safety  Ambulation/Gait Ambulation/Gait assistance: Min guard Ambulation Distance (Feet): 100 Feet Assistive device: Rolling walker (2 wheeled) Gait Pattern/deviations: Step-to pattern;Decreased stride length;Decreased stance time - right;Trunk flexed;Decreased dorsiflexion - left Gait velocity: decreased Gait velocity interpretation: Below normal speed for age/gender General Gait Details: Min guard for safety. Pts distance limited secondary to pain.  Cues for postural control.   Stairs            Wheelchair Mobility    Modified Rankin (Stroke Patients Only)       Balance Overall balance assessment: Needs assistance Sitting-balance support: No upper extremity supported Sitting balance-Leahy Scale: Good     Standing balance support: Bilateral upper extremity supported;During functional activity Standing balance-Leahy Scale: Fair Standing balance comment: Needs use of RW for UE support.                            Cognition Arousal/Alertness: Awake/alert Behavior During Therapy: WFL for tasks assessed/performed Overall Cognitive Status: Within Functional Limits for tasks assessed                                        Exercises Total Joint Exercises Hip ABduction/ADduction: AROM;Right;10 reps;Standing Long Arc Quad: AROM;Both;10 reps;Seated Knee Flexion: AROM;Both;10 reps;Standing Marching in Standing: AROM;Both;10 reps;Standing Standing Hip Extension: AROM;Right;10 reps;Standing    General Comments        Pertinent Vitals/Pain Pain Assessment: 0-10 Pain Score: 8  Pain Location: right hip and right breast Pain Descriptors /  Indicators: Discomfort Pain Intervention(s): Monitored during session;Limited activity within patient's tolerance;Patient requesting pain meds-RN notified;RN gave pain meds during session    Home Living                      Prior Function            PT Goals (current goals can now be found in  the care plan section) Acute Rehab PT Goals Patient Stated Goal: gain independence  PT Goal Formulation: With patient Time For Goal Achievement: 10/19/16 Potential to Achieve Goals: Good Progress towards PT goals: Progressing toward goals    Frequency    Min 3X/week      PT Plan Current plan remains appropriate    Co-evaluation              AM-PAC PT "6 Clicks" Daily Activity  Outcome Measure  Difficulty turning over in bed (including adjusting bedclothes, sheets and blankets)?: A Little Difficulty moving from lying on back to sitting on the side of the bed? : A Little Difficulty sitting down on and standing up from a chair with arms (e.g., wheelchair, bedside commode, etc,.)?: A Little Help needed moving to and from a bed to chair (including a wheelchair)?: A Little Help needed walking in hospital room?: A Little Help needed climbing 3-5 steps with a railing? : A Lot 6 Click Score: 17    End of Session Equipment Utilized During Treatment: Gait belt Activity Tolerance: Patient tolerated treatment well Patient left: in chair;with call bell/phone within reach Nurse Communication: Mobility status PT Visit Diagnosis: Repeated falls (R29.6);Other abnormalities of gait and mobility (R26.89);Difficulty in walking, not elsewhere classified (R26.2);Pain Pain - Right/Left: Right Pain - part of body: Hip     Time: 1135-1200 PT Time Calculation (min) (ACUTE ONLY): 25 min  Charges:  $Gait Training: 8-22 mins $Therapeutic Exercise: 8-22 mins                    G Codes:       Kallie Locks, [place] Pager [phone] Acute Rehab   Sheral Apley 10/07/2016, 12:14 PM

## 2016-10-08 MED ORDER — OXYCODONE-ACETAMINOPHEN 10-325 MG PO TABS: 1.0000 | ORAL_TABLET | Freq: Four times a day (QID) | ORAL | 0 refills | Status: DC | PRN

## 2016-10-08 NOTE — Care Management (Signed)
    Durable Medical Equipment        Start     Ordered   10/08/16 1651  For home use only DME 3 n 1  Once     10/08/16 1651   10/08/16 1650  For home use only DME Hospital bed  Once    Question Answer Comment  Patient has (list medical condition): Hip fracture, right, closed, initial encounter   The above medical condition requires: Patient requires the ability to reposition frequently   Head must be elevated greater than: 45 degrees   Bed type Semi-electric      10/08/16 1651   10/08/16 1649  For home use only DME Walker rolling  Once    Question:  Patient needs a walker to treat with the following condition  Answer:  Hip fracture, right, closed, initial encounter (HCC)   10/08/16 1651   10/04/16 1227  For home use only DME 3 n 1  Once     10/04/16 1226   10/04/16 1226  For home use only DME Walker rolling  Once    Question:  Patient needs a walker to treat with the following condition  Answer:  Hip fracture, right, closed, initial encounter (HCC)   10/04/16 1226

## 2016-10-08 NOTE — Care Management (Signed)
    Durable Medical Equipment        Start     Ordered   10/08/16 1651  For home use only DME 3 n 1  Once     10/08/16 1651   10/08/16 1650  For home use only DME Hospital bed  Once    Question Answer Comment  Patient has (list medical condition): Hip fracture, right, closed, initial encounter   The above medical condition requires: Patient requires the ability to reposition frequently   Head must be elevated greater than: 45 degrees   Bed type Semi-electric      10/08/16 1651   10/08/16 1649  For home use only DME Walker rolling  Once    Question:  Patient needs a walker to treat with the following condition  Answer:  Hip fracture, right, closed, initial encounter (HCC)   10/08/16 1651   10/04/16 1227  For home use only DME 3 n 1  Once     10/04/16 1226   10/04/16 1226  For home use only DME Walker rolling  Once    Question:  Patient needs a walker to treat with the following condition  Answer:  Hip fracture, right, closed, initial encounter (HCC)   10/04/16 1226     

## 2016-10-08 NOTE — Care Management (Signed)
Insurance denied SNF. Discussed home health and DME needs( orders entered). Patient wanted [facility] referral made , however [facility] is not in network with patient's insurance. Patient aware. Patient states she wants to appeal discharge. Updated IM given . Patient aware she cannot appeal until discharge order is written. MD aware of same.   Patient declined to pick home health agency at this time, or to arrange DME delivery.   Ronny Flurry RN BSN

## 2016-10-08 NOTE — Care Management Important Message (Signed)
Important Message  Patient Details  Name: Jasmine Benson MRN: [id number] Date of Birth: 1945/03/19   Medicare Important Message Given:  Yes    Kingsley Plan, RN 10/08/2016, 5:11 PM

## 2016-10-08 NOTE — Clinical Social Work Note (Signed)
CSW received confirmation from [place] [facility] [place] [facility] [place] stating [employer] denied short term rehab. RNCM aware and following pt for discharge needs. CSW signing off as no further Social Work needs identified.   Corlis Hove, LCSWA, LCASA Clinical Social Work [phone]

## 2016-10-08 NOTE — Progress Notes (Signed)
Patient is medically stable for discharge, discharge papers have been completed. Currently waiting for insurance clearance.  Patient remains stable, her vital signs include temperature 98.2, blood pressure 127/67, heart rate 93, respiratory rate of 17, oxygen saturation 97%  Lungs clear to auscultation, heart S1-S2 present rhythmic, the abdomen soft nontender, no lower extremity edema.  Plan: Waiting for discharge, social services have been consulted

## 2016-10-08 NOTE — Progress Notes (Signed)
     Subjective:  Patient reports pain as marked.  She was upset because she was only receiving 1 tablet of the oxycodone every 4 hours and required 2. She previously was on Dilaudid at when she came in, and had to be weaned off that to the oxycodone. She says even still the oxycodone is not really strong enough. She is concerned about being able to participate with the physical therapy due to pain.  Objective:   VITALS:   Vitals:   10/07/16 0500 10/07/16 1413 10/07/16 2115 10/08/16 0510  BP: (!) 134/54 118/63 (!) 120/50 127/67  Pulse: 96 95 88 99  Resp: 18 18 18 18   Temp: 98.4 F (36.9 C) 98 F (36.7 C) 98.8 F (37.1 C) 98.6 F (37 C)  TempSrc: Oral Oral Oral   SpO2: 100% 100% 100% 97%  Weight:      Height:        Neurologically intact Sensation intact distally Dorsiflexion/Plantar flexion intact Incision: dressing C/D/I   Lab Results  Component Value Date   WBC 7.3 10/06/2016   HGB 9.7 (L) 10/06/2016   HCT 29.6 (L) 10/06/2016   MCV 108.4 (H) 10/06/2016   PLT 277 10/06/2016   BMET    Component Value Date/Time   NA 137 10/06/2016 0212   K 4.7 10/06/2016 0212   CL 108 10/06/2016 0212   CO2 24 10/06/2016 0212   GLUCOSE 87 10/06/2016 0212   BUN 13 10/06/2016 0212   CREATININE 0.59 10/06/2016 0212   CALCIUM 8.4 (L) 10/06/2016 0212   GFRNONAA >60 10/06/2016 0212   GFRAA >60 10/06/2016 0212     Assessment/Plan: 5 Days Post-Op   Principal Problem:   Hip fracture, right, closed, initial encounter (HCC)   Advance diet Up with therapy Discharge to SNF She is making good progress, I have changed her medications to be oxycodone/acetaminophen 10/325 one to 2 tablets by mouth every 6 hours, 2 tablets of the 5/325 every 4 hours is borderline to much Tylenol, she will gradually transition off of this during her recovery. At skilled nursing facility and beyond.  I will plan to see her in the office in 2 weeks, weightbearing as tolerated right lower extremity, DVT  prophylaxis for 3 weeks with Lovenox.  I will sign off today, Dr. Madelon Lips and Sueanne Margarita is on call for me over the weekend if any questions arise.   Nemesis Rainwater P 10/08/2016, 8:08 AM   Teryl Lucy, MD Cell [phone]

## 2016-10-09 NOTE — Progress Notes (Signed)
Notified by [facility] that patient requested an expedited appeal of her DC at 1555. Case ID [id number]. The Care Management Department will get records to them via fax. They can take up to 72h to make a determination.

## 2016-10-09 NOTE — Care Management Important Message (Signed)
Important Message  Patient Details  Name: Jasmine Benson MRN: [id number] Date of Birth: [birth year]   Medicare Important Message Given:  Yes    Khaliel Morey, Derrill Memo, RN 10/09/2016, 2:22 PM

## 2016-10-09 NOTE — Progress Notes (Signed)
Noted an order to discharge patient but patient stated that she is not going home and that she is appealing the discharge. CM made aware of the order for discharge and that patient is appealing regarding discharge.

## 2016-10-09 NOTE — Progress Notes (Signed)
Patient has been discharged from the hospital, she has appealed the discharge.

## 2016-10-10 LAB — CREATININE, SERUM
Creatinine, Ser: 0.63 mg/dL (ref 0.44–1.00)
GFR calc Af Amer: >60 mL/min
GFR calc non Af Amer: >60 mL/min

## 2016-10-10 NOTE — Progress Notes (Signed)
Spoke with patient's RN to ensure no acute issues with patient. Did not see or examine patient. Patient has been OFFICIALLY DISCHARGED. Patient is appealing discharge.

## 2016-10-11 NOTE — Care Management Note (Signed)
Case Management Note  Patient Details  Name: Jasmine Benson MRN: [id number] Date of Birth: Aug 17, 1945  Subjective/Objective:                    Action/Plan:  Confirmed face  sheet information with patient. Discussed discharge appeal again. Patient in agreement to have home health and DME arranged just in case her appeal is denied.   [facility] will provide DME once discharge date confirmed. Patient aware. Patient wanted [facility] called same Maralyn Sago was unable to accept referral due to staffing. Patient aware . Patient would like [facility] . Referral given to [facility] with [facility] and accepted. Expected Discharge Date:  10/09/16               Expected Discharge Plan:  Home w Home Health Services  In-House Referral:  Clinical Social Work  Discharge planning Services  CM Consult  Post Acute Care Choice:  Durable Medical Equipment, Home Health Choice offered to:  Patient  DME Arranged:    DME Agency:     HH Arranged:  OT, PT, Nurse's Aide, Social Work [employer] Agency:  [facility]  Status of Service:  In process, will continue to follow  If discussed at Long Length of Stay Meetings, dates discussed:    Additional Comments:  Kingsley Plan, RN 10/11/2016, 12:07 PM

## 2016-10-11 NOTE — Progress Notes (Signed)
Patient ID: Jasmine Benson, female   DOB: 1945-11-12, 71 y.o.   MRN: [id number]     Subjective:  Patient reports pain as mild.  Patient reports that SNF was denied and would like to be able to go to SNF  Objective:   VITALS:   Vitals:   10/10/16 0439 10/10/16 1443 10/10/16 2213 10/11/16 0509  BP: (!) 115/58 119/63 120/64 122/68  Pulse: 83 93 83 84  Resp: 17 18 18 18   Temp: 98.3 F (36.8 C) 98.2 F (36.8 C) 98.3 F (36.8 C) 98.2 F (36.8 C)  TempSrc: Oral Oral Oral Oral  SpO2: 97% 97% 98% 98%  Weight:      Height:        ABD soft Sensation intact distally Dorsiflexion/Plantar flexion intact Incision: dressing C/D/I and no drainage   Lab Results  Component Value Date   WBC 7.3 10/06/2016   HGB 9.7 (L) 10/06/2016   HCT 29.6 (L) 10/06/2016   MCV 108.4 (H) 10/06/2016   PLT 277 10/06/2016   BMET    Component Value Date/Time   NA 137 10/06/2016 0212   K 4.7 10/06/2016 0212   CL 108 10/06/2016 0212   CO2 24 10/06/2016 0212   GLUCOSE 87 10/06/2016 0212   BUN 13 10/06/2016 0212   CREATININE 0.63 10/10/2016 0540   CALCIUM 8.4 (L) 10/06/2016 0212   GFRNONAA >60 10/10/2016 0540   GFRAA >60 10/10/2016 0540     Assessment/Plan: 8 Days Post-Op   Principal Problem:   Hip fracture, right, closed, initial encounter (HCC)   Advance diet Up with therapy Continue plan per medicine WBAT  Dry dressing PRN Patient has no family and would benefit from SNF   DOUGLAS PARRY, Apolinar Junes 10/11/2016, 9:40 AM  Discussed and agree with above.   Teryl Lucy, MD Cell [phone]

## 2016-10-11 NOTE — Progress Notes (Signed)
PT Cancellation Note  Patient Details Name: Jasmine Benson MRN: [id number] DOB: December 21, 1945   Cancelled Treatment:    Reason Eval/Treat Not Completed: Patient declined, no reason specified; patient reports on phone trying to get insurance approval for a rehab stay and unable to participate in PT at this time.  Offered to return in AM to practice stairs if needing to go home.  Patient in agreement.      Elray Mcgregor 10/11/2016, 4:38 PM  Sheran Lawless, [place] [phone] 10/11/2016

## 2016-10-12 NOTE — Progress Notes (Signed)
Physical Therapy Treatment Patient Details Name: Jasmine Benson MRN: [id number] DOB: Feb 20, 1946 Today's Date: 10/12/2016    History of Present Illness 71 yo admitted after fall at home with right hip fx s/p hemiarthoplasty posterior approach with glute med tendon repair since fall in July. PMHx:     PT Comments    Pt agitated on arrival secondary to d/c plans. Advised pt she would need to practice stair negotiation before d/c. Pt refused. Provided pt with verbal, visual, and written education on stair negotiation. Pt advised she has a nurse to assist her return home and negotiate steps to enter. Patient would benefit from continued skilled PT to increase safety and functional independence. Will continue to follow acutely.     Follow Up Recommendations  SNF (pt requests SNF at this time due to living alone and no longer feels she can manage at home)     Equipment Recommendations  Rolling walker with 5" wheels;3in1 (PT)    Recommendations for Other Services OT consult     Precautions / Restrictions Precautions Precautions: Fall;Posterior Hip Precaution Booklet Issued: Yes (comment) Precaution Comments: pt able to state 3/3 precautions Restrictions Weight Bearing Restrictions: No RLE Weight Bearing: Weight bearing as tolerated    Mobility  Bed Mobility               General bed mobility comments: In bathroom on arrival  Transfers Overall transfer level: Needs assistance Equipment used: Rolling walker (2 wheeled) Transfers: Sit to/from Stand Sit to Stand: Supervision         General transfer comment: Use of arm rests. No curing required for technique. Supervision for safety  Ambulation/Gait Ambulation/Gait assistance: Supervision Ambulation Distance (Feet): 15 Feet Assistive device: Rolling walker (2 wheeled) Gait Pattern/deviations: Step-to pattern;Decreased stride length;Decreased stance time - right;Trunk flexed;Decreased dorsiflexion - left Gait  velocity: decreased Gait velocity interpretation: Below normal speed for age/gender General Gait Details: Pt ambulated 15 feet in room after toileting. Advised pt she will need to practice stair negotiation before d/c. Pt adamantly refused despite encouragement. Pt agreeable to verbal education for stair negotiation. Provided pt with verbal, and visual demonstration along with handout. Pt advised she will have assist to negotiate steps on return home.   Stairs            Wheelchair Mobility    Modified Rankin (Stroke Patients Only)       Balance Overall balance assessment: Needs assistance Sitting-balance support: No upper extremity supported Sitting balance-Leahy Scale: Good     Standing balance support: Bilateral upper extremity supported;During functional activity Standing balance-Leahy Scale: Fair Standing balance comment: Needs use of RW for UE support.                            Cognition Arousal/Alertness: Awake/alert Behavior During Therapy: Agitated Overall Cognitive Status: Within Functional Limits for tasks assessed                                 General Comments: Pt very agitated on arrival. She was informed yesterday that insurance would not cover SNF and she will be returning home instead.      Exercises      General Comments        Pertinent Vitals/Pain Pain Assessment: Faces Faces Pain Scale: Hurts a little bit Pain Location: right hip and right breast Pain Descriptors / Indicators: Discomfort  Home Living                      Prior Function            PT Goals (current goals can now be found in the care plan section) Acute Rehab PT Goals Patient Stated Goal: gain independence  PT Goal Formulation: With patient Time For Goal Achievement: 10/19/16 Potential to Achieve Goals: Good Progress towards PT goals: Progressing toward goals    Frequency    Min 3X/week      PT Plan Current plan remains  appropriate    Co-evaluation              AM-PAC PT "6 Clicks" Daily Activity  Outcome Measure  Difficulty turning over in bed (including adjusting bedclothes, sheets and blankets)?: A Little Difficulty moving from lying on back to sitting on the side of the bed? : A Little Difficulty sitting down on and standing up from a chair with arms (e.g., wheelchair, bedside commode, etc,.)?: A Little Help needed moving to and from a bed to chair (including a wheelchair)?: A Little Help needed walking in hospital room?: A Little Help needed climbing 3-5 steps with a railing? : A Lot 6 Click Score: 17    End of Session Equipment Utilized During Treatment: Gait belt Activity Tolerance: Patient tolerated treatment well Patient left: in chair;with call bell/phone within reach Nurse Communication: Mobility status PT Visit Diagnosis: Repeated falls (R29.6);Other abnormalities of gait and mobility (R26.89);Difficulty in walking, not elsewhere classified (R26.2);Pain Pain - Right/Left: Right Pain - part of body: Hip     Time: 1610-9604 PT Time Calculation (min) (ACUTE ONLY): 17 min  Charges:  $Gait Training: 8-22 mins                    G Codes:       Kallie Locks, [place] Pager [phone] Acute Rehab   Sheral Apley 10/12/2016, 11:43 AM

## 2016-10-12 NOTE — Care Management Note (Signed)
Case Management Note  Patient Details  Name: Jasmine Benson MRN: [id number] Date of Birth: 08-Dec-1945  Subjective/Objective:                    Action/Plan Patient's appeal to discharge was denied. Patient has arranged for her friend to pick her up today at 1130. [facility] aware discharge is today and will bring walker to room by 1130 and arrange delivery of 3 in 1 and hospital bed to home.   Patient has called insurance company to appeal her denial for SNF, and was given a number for her MD to call for peer to peer. Patient reports she has already given the peer to peer number to Dr Dion Saucier.  Spoke with SW [place] . If DR Dion Saucier does peer to peer and denial  for SNF  Is over turned she can be placed from home.   Patient voiced understanding. Expected Discharge Date:  10/09/16               Expected Discharge Plan:  Home w Home Health Services  In-House Referral:  Clinical Social Work  Discharge planning Services  CM Consult  Post Acute Care Choice:  Durable Medical Equipment, Home Health Choice offered to:  Patient  DME Arranged:  3-N-1, Walker rolling, Hospital bed DME Agency:     HH Arranged:  OT, PT, Nurse's Aide, Social Work [employer] Agency:  [facility]  Status of Service:  Completed, signed off  If discussed at [employer], dates discussed:    Additional Comments:  Kingsley Plan, RN 10/12/2016, 10:48 AM

## 2016-10-12 NOTE — Progress Notes (Signed)
Patient continued to be medically stable, she has appealed her discharge.

## 2016-10-12 NOTE — Progress Notes (Signed)
Patient discharged to home with equipment and prescriptions, demonstrated lovenox self injection, verbalized understanding.

## 2016-10-12 NOTE — Care Management Important Message (Signed)
Important Message  Patient Details  Name: Jasmine Benson MRN: [id number] Date of Birth: 07/24/1945   Medicare Important Message Given:  Yes    Dorena Bodo 10/12/2016, 12:20 PM

## 2016-10-15 ENCOUNTER — Encounter (HOSPITAL_COMMUNITY): Payer: Self-pay

## 2016-10-15 ENCOUNTER — Emergency Department (HOSPITAL_COMMUNITY): Payer: Medicare HMO

## 2016-10-15 ENCOUNTER — Emergency Department (HOSPITAL_COMMUNITY)
Admission: EM | Admit: 2016-10-15 | Discharge: 2016-10-16 | Disposition: A | Discharge status: Home / Self Care | Payer: Medicare HMO | Attending: Emergency Medicine | Admitting: Emergency Medicine

## 2016-10-15 DIAGNOSIS — Z79899 Other long term (current) drug therapy: Secondary | ICD-10-CM | POA: Diagnosis not present

## 2016-10-15 DIAGNOSIS — W010XXA Fall on same level from slipping, tripping and stumbling without subsequent striking against object, initial encounter: Secondary | ICD-10-CM | POA: Diagnosis not present

## 2016-10-15 DIAGNOSIS — Z7982 Long term (current) use of aspirin: Secondary | ICD-10-CM | POA: Diagnosis not present

## 2016-10-15 DIAGNOSIS — M542 Cervicalgia: Secondary | ICD-10-CM | POA: Insufficient documentation

## 2016-10-15 DIAGNOSIS — Y939 Activity, unspecified: Secondary | ICD-10-CM | POA: Insufficient documentation

## 2016-10-15 DIAGNOSIS — R531 Weakness: Secondary | ICD-10-CM | POA: Insufficient documentation

## 2016-10-15 DIAGNOSIS — Y999 Unspecified external cause status: Secondary | ICD-10-CM | POA: Diagnosis not present

## 2016-10-15 DIAGNOSIS — Y92003 Bedroom of unspecified non-institutional (private) residence as the place of occurrence of the external cause: Secondary | ICD-10-CM | POA: Diagnosis not present

## 2016-10-15 DIAGNOSIS — M25559 Pain in unspecified hip: Secondary | ICD-10-CM | POA: Diagnosis not present

## 2016-10-15 DIAGNOSIS — W19XXXA Unspecified fall, initial encounter: Secondary | ICD-10-CM

## 2016-10-15 LAB — I-STAT CHEM 8, ED
BUN: 13 mg/dL (ref 6–20)
CREATININE: 0.5 mg/dL (ref 0.44–1.00)
Calcium, Ion: 1.15 mmol/L (ref 1.15–1.40)
Chloride: 109 mmol/L (ref 101–111)
GLUCOSE: 102 mg/dL — AB (ref 65–99)
HCT: 30 % — ABNORMAL LOW (ref 36.0–46.0)
HEMOGLOBIN: 10.2 g/dL — AB (ref 12.0–15.0)
Potassium: 3.5 mmol/L (ref 3.5–5.1)
Sodium: 144 mmol/L (ref 135–145)
TCO2: 23 mmol/L (ref 22–32)

## 2016-10-15 NOTE — ED Notes (Signed)
Patient transported to X-ray 

## 2016-10-15 NOTE — ED Triage Notes (Signed)
Pt arrived via [facility] from home after fall.  Denies hitting head or LOC.  Pt does complain of involuntary jerking movements. Recent hip surgery on the 12th.

## 2016-10-15 NOTE — ED Provider Notes (Signed)
[facility]-EMER[facility]ENCY DEPT Provider Note   CSN: [id number] Arrival date & time: 10/15/16  1956     History   Chief Complaint Chief Complaint  Patient presents with  . Fall    HPI Jasmine Benson is a 71 y.o. female.  HPI Patient with recent right hip fracture s/p ORIF 8/12 discharged to home 8/21, on lovenox who presents after multiple falls today. Patient reports feeling "jerky" today, described as muscle spasms. Stood up from bed, felt unstable and fell to ground. Stood up with her walker, still felt unsteady on feet, and fell again. She denies hitting her head or any LOC. She is still on lovenox injections for recent hip surgery. She had mechanical fall on 8/12, suffered right hip fracture s/p ORIF. Patient reports she wanted to be discharged to a SNF or rehab, but insurance was denied. She was discharged Aug 21 to home. She is complaining of bilateral hip pain and neck pain. Concerns about safety at home.   Past Medical History:  Diagnosis Date  . Arthritis    "probably in my hands; have some in my left knee" (10/05/2016)    Patient Active Problem List   Diagnosis Date Noted  . Hip fracture, right, closed, initial encounter (HCC) 10/03/2016    Past Surgical History:  Procedure Laterality Date  . APPENDECTOMY    . AUGMENTATION MAMMAPLASTY Bilateral   . CATARACT EXTRACTION W/ INTRAOCULAR LENS IMPLANT Right 07/13/2016  . FRACTURE SURGERY    . HIP ARTHROPLASTY Right 10/03/2016   Procedure: ARTHROPLASTY BIPOLAR HIP (HEMIARTHROPLASTY);  Surgeon: Teryl Lucy, MD;  Location: [facility] OR;  Service: Orthopedics;  Laterality: Right;  . KNEE ARTHROSCOPY Left    "torn meniscus"  . SYMPATHECTOMY Left 1966   "lumbar; opened me up"  . TONSILLECTOMY    . TUBAL LIGATION    . VAGINAL HYSTERECTOMY      OB History    No data available       Home Medications    Prior to Admission medications   Medication Sig Start Date End Date Taking? Authorizing Provider  aspirin 325 MG EC tablet  Take 325 mg by mouth daily.   Yes [provider]  baclofen (LIORESAL) 10 MG tablet Take 1 tablet (10 mg total) by mouth 3 (three) times daily. As needed for muscle spasm 10/03/16  Yes Teryl Lucy, MD  docusate sodium (COLACE) 100 MG capsule Take 100 mg by mouth daily.   Yes [provider]  enoxaparin (LOVENOX) 40 MG/0.4ML injection Inject 0.4 mLs (40 mg total) into the skin daily. 10/03/16  Yes Teryl Lucy, MD  ferrous sulfate 325 (65 FE) MG tablet Take 1 tablet (325 mg total) by mouth 2 (two) times daily with a meal. 10/07/16 11/06/16 Yes Arrien, York Ram, MD  oxyCODONE-acetaminophen (PERCOCET) 10-325 MG tablet Take 1-2 tablets by mouth every 6 (six) hours as needed for pain. MAXIMUM TOTAL ACETAMINOPHEN DOSE IS 4000 MG PER DAY 10/08/16  Yes Teryl Lucy, MD  ibuprofen (MOTRIN IB) 200 MG tablet Take 1 tablet (200 mg total) by mouth every 6 (six) hours as needed for headache or moderate pain. 10/07/16 10/07/17  Arrien, York Ram, MD  ondansetron (ZOFRAN) 4 MG tablet Take 1 tablet (4 mg total) by mouth every 8 (eight) hours as needed for nausea or vomiting. 10/03/16   Teryl Lucy, MD  sennosides-docusate sodium (SENOKOT-S) 8.6-50 MG tablet Take 2 tablets by mouth daily. 10/03/16   Teryl Lucy, MD    Family History History reviewed. No pertinent family  history.  Social History Social History  Substance Use Topics  . Smoking status: Never Smoker  . Smokeless tobacco: Never Used  . Alcohol use 8.4 oz/week    14 Glasses of wine per week     Comment: 10/05/2016 "couple glasses of red wine/day"     Allergies   Patient has no known allergies.   Review of Systems Review of Systems  Constitutional: Negative for chills and fever.  HENT: Negative for ear pain and sore throat.   Eyes: Negative for pain and visual disturbance.  Respiratory: Negative for cough and shortness of breath.   Cardiovascular: Negative for chest pain and palpitations.    Gastrointestinal: Negative for abdominal pain and vomiting.  Genitourinary: Negative for dysuria and hematuria.  Musculoskeletal: Positive for gait problem and neck pain. Negative for arthralgias and back pain.  Skin: Negative for color change and rash.  Neurological: Negative for seizures and syncope.  All other systems reviewed and are negative.    Physical Exam Updated Vital Signs BP 123/65   Pulse 92   Resp 19   Ht 5\' 6"  (1.676 m)   Wt 63.5 kg (140 lb)   LMP  (LMP Unknown)   SpO2 98%   BMI 22.60 kg/m   Physical Exam  Constitutional: She appears well-developed and well-nourished. No distress.  HENT:  Head: Normocephalic and atraumatic.  Eyes: Conjunctivae are normal.  Neck: Neck supple.  Cardiovascular: Normal rate and regular rhythm.   No murmur heard. Pulmonary/Chest: Effort normal and breath sounds normal. No respiratory distress.  Abdominal: Soft. There is no tenderness.  Musculoskeletal: She exhibits tenderness (cervical midline ttp, bilateral hip ttp). She exhibits no edema or deformity.  Neurological: She is alert.  Skin: Skin is warm and dry.  Psychiatric: She has a normal mood and affect.  Nursing note and vitals reviewed.    ED Treatments / Results  Labs (all labs ordered are listed, but only abnormal results are displayed) Labs Reviewed  I-STAT CHEM 8, ED - Abnormal; Notable for the following:       Result Value   Glucose, Bld 102 (*)    Hemoglobin 10.2 (*)    HCT 30.0 (*)    All other components within normal limits    EKG  EKG Interpretation None       Radiology Ct Head Wo Contrast  Result Date: 10/15/2016 CLINICAL DATA:  Fall EXAM: CT HEAD WITHOUT CONTRAST CT CERVICAL SPINE WITHOUT CONTRAST TECHNIQUE: Multidetector CT imaging of the head and cervical spine was performed following the standard protocol without intravenous contrast. Multiplanar CT image reconstructions of the cervical spine were also generated. COMPARISON:  12/28/2014  FINDINGS: CT HEAD FINDINGS Brain: No evidence of acute infarction, hemorrhage, hydrocephalus, extra-axial collection or mass lesion/mass effect. Mild cortical atrophy. Mild subcortical white matter and periventricular small vessel ischemic changes. Vascular: Mild intracranial atherosclerosis. Skull: Normal. Negative for fracture or focal lesion. Sinuses/Orbits: The visualized paranasal sinuses are essentially clear. The mastoid air cells are unopacified. Other: None. CT CERVICAL SPINE FINDINGS Alignment: Normal cervical lordosis. Skull base and vertebrae: No acute fracture. No primary bone lesion or focal pathologic process. Soft tissues and spinal canal: No prevertebral fluid or swelling. No visible canal hematoma. Disc levels:  Mild multilevel degenerative changes. 4 mm anterolisthesis of C7 on T1. Spinal canal is patent. Upper chest: Mild biapical pleural-parenchymal scarring. Other: Visualized lung apices are clear. IMPRESSION: No evidence of acute intracranial abnormality. Mild cortical atrophy with small vessel ischemic changes. No evidence of traumatic injury  to the cervical spine. Mild multilevel degenerative changes. Electronically Signed   By: Charline Bills M.D.   On: 10/15/2016 21:45   Ct Cervical Spine Wo Contrast  Result Date: 10/15/2016 CLINICAL DATA:  Fall EXAM: CT HEAD WITHOUT CONTRAST CT CERVICAL SPINE WITHOUT CONTRAST TECHNIQUE: Multidetector CT imaging of the head and cervical spine was performed following the standard protocol without intravenous contrast. Multiplanar CT image reconstructions of the cervical spine were also generated. COMPARISON:  12/28/2014 FINDINGS: CT HEAD FINDINGS Brain: No evidence of acute infarction, hemorrhage, hydrocephalus, extra-axial collection or mass lesion/mass effect. Mild cortical atrophy. Mild subcortical white matter and periventricular small vessel ischemic changes. Vascular: Mild intracranial atherosclerosis. Skull: Normal. Negative for fracture or  focal lesion. Sinuses/Orbits: The visualized paranasal sinuses are essentially clear. The mastoid air cells are unopacified. Other: None. CT CERVICAL SPINE FINDINGS Alignment: Normal cervical lordosis. Skull base and vertebrae: No acute fracture. No primary bone lesion or focal pathologic process. Soft tissues and spinal canal: No prevertebral fluid or swelling. No visible canal hematoma. Disc levels:  Mild multilevel degenerative changes. 4 mm anterolisthesis of C7 on T1. Spinal canal is patent. Upper chest: Mild biapical pleural-parenchymal scarring. Other: Visualized lung apices are clear. IMPRESSION: No evidence of acute intracranial abnormality. Mild cortical atrophy with small vessel ischemic changes. No evidence of traumatic injury to the cervical spine. Mild multilevel degenerative changes. Electronically Signed   By: Charline Bills M.D.   On: 10/15/2016 21:45   Dg Hips Bilat With Pelvis 2v  Result Date: 10/15/2016 CLINICAL DATA:  Fall, left hip pain EXAM: DG HIP (WITH OR WITHOUT PELVIS) 2V BILAT COMPARISON:  None. FINDINGS: Status post right hip arthroplasty. No fracture dislocation of the left hip. Left hip joint space is preserved. Visualized bony pelvis appears intact. IMPRESSION: No fracture or dislocation is seen. Status post right hip arthroplasty. Electronically Signed   By: Charline Bills M.D.   On: 10/15/2016 22:13    Procedures Procedures (including critical care time)  Medications Ordered in ED Medications  HYDROcodone-acetaminophen (NORCO/VICODIN) 5-325 MG per tablet 2 tablet (2 tablets Oral Given 10/16/16 0021)     Initial Impression / Assessment and Plan / ED Course  I have reviewed the triage vital signs and the nursing notes.  Pertinent labs & imaging results that were available during my care of the patient were reviewed by me and considered in my medical decision making (see chart for details).     Patient with recent discharge after right hip fracture who  presents after mechanical fall with neck and bilateral hip pain. Patient was refused SNF placement by insurance. Patient denied LOC. Radiographs of hips and CT head/neck negative for acute findings.    Concern for safety at home given multiple falls after discharge from hospital. Patient would most likely benefit from placement in SNF. Discussed with hospitalist for admission. Patient elected to remain in ED overnight, with plans for social work in AM to attempt to hire home health nurse to provide 24/7 assistance. If this is unable to be done in AM, patient will need inpatient admission for placement. Patient treated for pain. Transfer of care to [employer]. Please see her note for final patient disposition.   Patient and plan of care discussed with Attending physician, Dr. Rhunette Croft.    Final Clinical Impressions(s) / ED Diagnoses   Final diagnoses:  Fall  Weakness    New Prescriptions New Prescriptions   No medications on file     Wynelle Cleveland, MD 10/16/16 0116  Derwood Kaplan, MD 10/19/16 1539

## 2016-10-15 NOTE — Care Management (Signed)
Patient was discharged home with [facility] 4 days ago following a rt hip hemiarthroplasty.  Patient was denied SNF placement by insurance . Patient presents today s/p fall.  CM met with at bedside patient reports living alone without any family support. CM discussed that she does not meet criteria for admission, her insurance company had denied the request for SNF, Patient given option to hire private duty assistance, has agreed to do so and continue with HH services. CSW was ordered. Ordrers were re-faxed to [facility]. Patient complains of rt hip pain 10/10 and  She reports being unable to weight bear after the fall. Patient will work on arranging private duty care for safe discharge.  

## 2016-10-16 MED ORDER — ONDANSETRON HCL 4 MG PO TABS: 4.0000 mg | ORAL_TABLET | Freq: Three times a day (TID) | ORAL | Status: DC | PRN

## 2016-10-16 MED ORDER — FERROUS SULFATE 325 (65 FE) MG PO TABS: 325.0000 mg | ORAL_TABLET | Freq: Two times a day (BID) | ORAL | Status: DC

## 2016-10-16 MED ORDER — ASPIRIN EC 325 MG PO TBEC
325.0000 mg | DELAYED_RELEASE_TABLET | Freq: Every day | ORAL | Status: DC
  Filled 2016-10-16: qty 1

## 2016-10-16 MED ORDER — BACLOFEN 5 MG HALF TABLET
10.0000 mg | ORAL_TABLET | Freq: Three times a day (TID) | ORAL | Status: DC
  Administered 2016-10-16: 10 mg via ORAL
  Filled 2016-10-16: qty 2

## 2016-10-16 MED ORDER — HYDROCODONE-ACETAMINOPHEN 5-325 MG PO TABS
2.0000 | ORAL_TABLET | Freq: Four times a day (QID) | ORAL | Status: DC | PRN
  Administered 2016-10-16 (×3): 2 via ORAL
  Filled 2016-10-16 (×3): qty 2

## 2016-10-16 MED ORDER — OXYCODONE-ACETAMINOPHEN 10-325 MG PO TABS: 1.0000 | ORAL_TABLET | Freq: Four times a day (QID) | ORAL | Status: DC | PRN

## 2016-10-16 MED ORDER — SENNA-DOCUSATE SODIUM 8.6-50 MG PO TABS: 2.0000 | ORAL_TABLET | Freq: Every day | ORAL | Status: DC

## 2016-10-16 NOTE — ED Notes (Signed)
Pt arrived to F8 via stretcher - offered to move pt to bed - pt declined. Pt alert, oriented, cooperative. States her family is trying to make arrangements for her to be cared for at home d/t insurance will not pay for SNF or assisted living. States she is unable to care for herself. Coffee and ice water given as requested while waiting for breakfast tray.

## 2016-10-16 NOTE — ED Notes (Signed)
Regular Diet House Tray was ordered for Breakfast.

## 2016-10-16 NOTE — ED Notes (Signed)
Burna Mortimer, CM, called and advised will call PT.

## 2016-10-16 NOTE — Care Management Note (Addendum)
Case Management Note  Patient Details  Name: Jasmine Benson MRN: [id number] Date of Birth: March 03, 1945  Subjective/Objective: 71 y.o. F seen in the ED following multiple  Falls at home. Pt is s/p ORIF 8/12 with discharge 8/21 with HHPT/OT/Nurse aide and CSW provided by [facility]. Pt seen by [facility] last pm and note indicates HH orders re-faxed to resume orders. Will confirm. There is an order for PT to re-eval today. Called Donia Pounds, rep for [employer] to confirm above. Will await call.                    Action/Plan:CM will follow closely for disposition/discharge needs.    Expected Discharge Date:                  Expected Discharge Plan:  Home w Home Health Services  In-House Referral:  NA  Discharge planning Services  CM Consult  Post Acute Care Choice:  Resumption of Svcs/PTA Provider (pt active with [facility] prior to admission) Choice offered to:  Patient  DME Arranged:    DME Agency:  [facility]  HH Arranged:  PT, OT, Nurse's Aide, Social Work [employer] Agency:  [facility]  Status of Service:  In process, will continue to follow  If discussed at Long Length of Stay Meetings, dates discussed:    Additional Comments:  Yvone Neu, RN 10/16/2016, 9:07 AM

## 2016-10-16 NOTE — ED Provider Notes (Signed)
Notified by nursing that pt plan is for d/c. Reviewed labs, imaging. Pt is s/p recent ORIF and d/c to home on 8/21, on lovenox, here with falls. Was accepted to SNF but pt refused, so plan for HHPT and PT re-eval today.  Pt has been re-evaluated by PT, who recommends HHPT. This has been arranged with SW. Pt is otherwise HDS in NAD. Family present and feels comfortable with plan to d/c with home health.    Shaune Pollack, MD 10/16/16 [phone]

## 2016-10-16 NOTE — Care Management Note (Addendum)
Case Management Note  Patient Details  Name: Jasmine Benson MRN: [id number] Date of Birth: 24-Oct-1945  Subjective/Objective: pt is active with [facility] and [facility] confirms receipt of Resumption orders. Awaiting PT re-eval for this pt. Pt agrees that [facility] and Private duty will suffice for home care . She has made plans with a friend who is in health care to have assist in her home.  Has all DME. Will provide with Private Duty agency list.                    Action/Plan: CM will sign off for now but will be available should additional discharge needs arise or disposition change.    Expected Discharge Date:                  Expected Discharge Plan:  Home w Home Health Services  In-House Referral:  NA  Discharge planning Services  CM Consult  Post Acute Care Choice:  Resumption of Svcs/PTA Provider (pt active with [facility] prior to admission) Choice offered to:  Patient  DME Arranged:    DME Agency:  [facility]  HH Arranged:  PT, OT, Nurse's Aide, Social Work [employer] Agency:  [facility]  Status of Service:  In process, will continue to follow  If discussed at Long Length of Stay Meetings, dates discussed:    Additional Comments:  Yvone Neu, RN 10/16/2016, 9:13 AM

## 2016-10-16 NOTE — ED Notes (Signed)
Toniann Fail, PT, aware of PT consult.

## 2016-10-16 NOTE — ED Notes (Signed)
Fall Risk bracelet and FALL RISK sign placed on door.

## 2016-10-16 NOTE — ED Notes (Signed)
Patient was given Coffee and Cream w/ Ice cup of water.

## 2016-10-16 NOTE — ED Notes (Signed)
Dr Erma Heritage aware pt's friend has arrived to pick her up. Pt aware waiting on d/c paperwork.

## 2016-10-16 NOTE — ED Notes (Signed)
Ambulating in room in w/walker.

## 2016-10-16 NOTE — ED Notes (Signed)
PT in w/pt.  

## 2016-10-16 NOTE — ED Notes (Signed)
Patient was given Cheree Ditto Crackles and Peanut Butter and Cup of Coffee and Cream, and A Regular Diet was ordered for Lunch.

## 2016-10-16 NOTE — Evaluation (Signed)
Physical Therapy Evaluation Patient Details Name: Jasmine Benson MRN: [id number] DOB: November 13, 1945 Today's Date: 10/16/2016   History of Present Illness  Pt is a 71 yo female being seen in ED following two falls at home. Pt was recently d/c'd 10/12/16 home after being admitted with right THA following a fall at home. PMH significant for OA.  Clinical Impression  Pt presents with the above diagnosis and below deficits for therapy evaluation. Prior to admission, pt was living at home alone receiving occasional assistance from friends since being recently D/C'd from the hospital. Pt is very frustrated that she was not approved for SNF placement despite therapy and MD recommendations. Pt requires Min guard to supervision for all mobility this session. Pt will benefit from continued HHPT at discharge in order to maximize her outcomes and assist with a return to PLOF. Pt will benefit from continued acute PT follow-up while admitted.     Follow Up Recommendations Home health PT;Supervision for mobility/OOB    Equipment Recommendations  None recommended by PT    Recommendations for Other Services OT consult     Precautions / Restrictions Precautions Precautions: Fall;Posterior Hip Precaution Booklet Issued: Yes (comment) Precaution Comments: pt able to state 3/3 precautions Restrictions Weight Bearing Restrictions: No RLE Weight Bearing: Weight bearing as tolerated      Mobility  Bed Mobility Overal bed mobility: Needs Assistance Bed Mobility: Supine to Sit;Sit to Supine     Supine to sit: Min guard Sit to supine: Min assist   General bed mobility comments: Min guard for safety  Transfers Overall transfer level: Needs assistance Equipment used: Rolling walker (2 wheeled) Transfers: Sit to/from Stand Sit to Stand: Supervision         General transfer comment: No cueing for safety, pushed up from EOB  Ambulation/Gait Ambulation/Gait assistance: Supervision Ambulation  Distance (Feet): 100 Feet Assistive device: Rolling walker (2 wheeled) Gait Pattern/deviations: Step-to pattern;Decreased stride length;Decreased stance time - right;Trunk flexed;Decreased dorsiflexion - left Gait velocity: decreased Gait velocity interpretation: Below normal speed for age/gender General Gait Details: Pt is able to perform short distance gait in unit. No LOB, minimal antalgia noted.   Stairs            Wheelchair Mobility    Modified Rankin (Stroke Patients Only)       Balance Overall balance assessment: Needs assistance Sitting-balance support: No upper extremity supported Sitting balance-Leahy Scale: Good     Standing balance support: Bilateral upper extremity supported;During functional activity Standing balance-Leahy Scale: Fair Standing balance comment: Needs use of RW for UE support.                             Pertinent Vitals/Pain Pain Assessment: 0-10 Pain Score: 5  Pain Location: right hip  Pain Descriptors / Indicators: Discomfort Pain Intervention(s): Monitored during session;Premedicated before session;Repositioned    Home Living Family/patient expects to be discharged to:: Private residence Living Arrangements: Alone Available Help at Discharge: Friend(s);Available PRN/intermittently Type of Home: House Home Access: Stairs to enter   Entrance Stairs-Number of Steps: 1 Home Layout: Two level;1/2 bath on main level;Bed/bath upstairs Home Equipment: Walker - 2 wheels;Bedside commode;Crutches;Hand held shower head;Hospital bed Additional Comments: pt has been sleeping downstairs in a hospital bed    Prior Function Level of Independence: Independent with assistive device(s)         Comments: able to negotiate home, but diffiuclt at this time     Hand Dominance  Dominant Hand: Right    Extremity/Trunk Assessment   Upper Extremity Assessment Upper Extremity Assessment: Defer to OT evaluation    Lower Extremity  Assessment Lower Extremity Assessment: RLE deficits/detail RLE Deficits / Details: decreased ROM and strength post op    Cervical / Trunk Assessment Cervical / Trunk Assessment: Normal  Communication   Communication: No difficulties  Cognition Arousal/Alertness: Awake/alert Behavior During Therapy: Agitated;WFL for tasks assessed/performed Overall Cognitive Status: Within Functional Limits for tasks assessed                                        General Comments      Exercises     Assessment/Plan    PT Assessment Patient needs continued PT services  PT Problem List Decreased strength;Decreased mobility;Decreased range of motion;Decreased activity tolerance;Decreased balance;Decreased knowledge of use of DME;Pain;Decreased knowledge of precautions       PT Treatment Interventions Gait training;Therapeutic exercise;Patient/family education;Stair training;Functional mobility training;DME instruction;Therapeutic activities    PT Goals (Current goals can be found in the Care Plan section)  Acute Rehab PT Goals Patient Stated Goal: gain independence  PT Goal Formulation: With patient Time For Goal Achievement: 10/23/16 Potential to Achieve Goals: Good    Frequency Min 3X/week   Barriers to discharge Decreased caregiver support      Co-evaluation               AM-PAC PT "6 Clicks" Daily Activity  Outcome Measure Difficulty turning over in bed (including adjusting bedclothes, sheets and blankets)?: None Difficulty moving from lying on back to sitting on the side of the bed? : None Difficulty sitting down on and standing up from a chair with arms (e.g., wheelchair, bedside commode, etc,.)?: Unable Help needed moving to and from a bed to chair (including a wheelchair)?: A Little Help needed walking in hospital room?: A Little Help needed climbing 3-5 steps with a railing? : A Lot 6 Click Score: 17    End of Session Equipment Utilized During  Treatment: Gait belt Activity Tolerance: Patient tolerated treatment well Patient left: in bed;with call bell/phone within reach Nurse Communication: Mobility status PT Visit Diagnosis: Repeated falls (R29.6);Other abnormalities of gait and mobility (R26.89);Difficulty in walking, not elsewhere classified (R26.2);Pain Pain - Right/Left: Right Pain - part of body: Hip    Time: 1200-1223 PT Time Calculation (min) (ACUTE ONLY): 23 min   Charges:   PT Evaluation $PT Eval Low Complexity: 1 Low PT Treatments $Gait Training: 8-22 mins   PT G Codes:   PT G-Codes **NOT FOR INPATIENT CLASS** Functional Assessment Tool Used: AM-PAC 6 Clicks Basic Mobility;Clinical judgement Functional Limitation: Mobility: Walking and moving around Mobility: Walking and Moving Around Current Status (Z3086): At least 40 percent but less than 60 percent impaired, limited or restricted Mobility: Walking and Moving Around Goal Status [phone]): At least 1 percent but less than 20 percent impaired, limited or restricted    Colin Broach PT, DPT  [phone]   Roxy Manns 10/16/2016, 1:45 PM

## 2016-10-16 NOTE — ED Notes (Signed)
Patient is aware that we will move her to POD C while she arranges for safe housing. Breakfast ordered and pain assessed.

## 2016-10-16 NOTE — ED Notes (Signed)
Pt requesting to wear gown home rather than change into her belongings. Pt states she has all of her personal items she came to ED w/ with her.

## 2016-10-16 NOTE — ED Notes (Signed)
Pt sitting in chair talking on her cell phone - states her friend took her [employer] account planner. States she all of her belongings w/her she came into ED with.

## 2016-10-16 NOTE — ED Notes (Signed)
Lying on bed, alert. Waiting on friend to pick her up.

## 2016-10-27 ENCOUNTER — Other Ambulatory Visit (HOSPITAL_COMMUNITY): Payer: Self-pay | Admitting: Orthopedic Surgery

## 2016-10-27 DIAGNOSIS — R52 Pain, unspecified: Secondary | ICD-10-CM

## 2016-10-28 ENCOUNTER — Ambulatory Visit (HOSPITAL_COMMUNITY)
Admission: RE | Admit: 2016-10-28 | Discharge: 2016-10-28 | Disposition: A | Discharge status: Home / Self Care | Payer: Medicare HMO | Source: Ambulatory Visit | Attending: Orthopedic Surgery | Admitting: Orthopedic Surgery

## 2016-10-28 ENCOUNTER — Encounter (HOSPITAL_COMMUNITY): Payer: Medicare HMO

## 2016-10-28 DIAGNOSIS — M7989 Other specified soft tissue disorders: Secondary | ICD-10-CM | POA: Insufficient documentation

## 2016-10-28 DIAGNOSIS — M79605 Pain in left leg: Secondary | ICD-10-CM | POA: Insufficient documentation

## 2016-10-28 DIAGNOSIS — M79604 Pain in right leg: Secondary | ICD-10-CM | POA: Insufficient documentation

## 2016-10-28 DIAGNOSIS — R52 Pain, unspecified: Secondary | ICD-10-CM

## 2016-10-28 NOTE — Progress Notes (Signed)
*  Preliminary Results* Bilateral lower extremity venous duplex completed. Bilateral lower extremities are negative for deep vein thrombosis. There is no evidence of Baker's cyst bilaterally.  10/28/2016 2:58 PM Gertie FeyMichelle Tadashi Burkel, BS, RVT, RDCS, RDMS

## 2016-11-15 ENCOUNTER — Emergency Department (HOSPITAL_COMMUNITY)
Admission: EM | Admit: 2016-11-15 | Discharge: 2016-11-15 | Disposition: A | Discharge status: Home / Self Care | Payer: Medicare HMO | Attending: Emergency Medicine | Admitting: Emergency Medicine

## 2016-11-15 ENCOUNTER — Emergency Department (HOSPITAL_COMMUNITY): Payer: Medicare HMO

## 2016-11-15 ENCOUNTER — Encounter (HOSPITAL_COMMUNITY): Payer: Self-pay | Admitting: Emergency Medicine

## 2016-11-15 DIAGNOSIS — R251 Tremor, unspecified: Secondary | ICD-10-CM | POA: Diagnosis present

## 2016-11-15 DIAGNOSIS — Z7982 Long term (current) use of aspirin: Secondary | ICD-10-CM | POA: Diagnosis not present

## 2016-11-15 DIAGNOSIS — G2571 Drug induced akathisia: Secondary | ICD-10-CM | POA: Diagnosis not present

## 2016-11-15 DIAGNOSIS — Z79899 Other long term (current) drug therapy: Secondary | ICD-10-CM | POA: Insufficient documentation

## 2016-11-15 DIAGNOSIS — Z96641 Presence of right artificial hip joint: Secondary | ICD-10-CM | POA: Insufficient documentation

## 2016-11-15 LAB — CBC WITH DIFFERENTIAL/PLATELET
BASOS ABS: 0 10*3/uL (ref 0.0–0.1)
Basophils Relative: 0 %
Eosinophils Absolute: 0.1 10*3/uL (ref 0.0–0.7)
Eosinophils Relative: 1 %
HEMATOCRIT: 34.2 % — AB (ref 36.0–46.0)
Hemoglobin: 10.9 g/dL — ABNORMAL LOW (ref 12.0–15.0)
LYMPHS PCT: 18 %
Lymphs Abs: 1 10*3/uL (ref 0.7–4.0)
MCH: 33.6 pg (ref 26.0–34.0)
MCHC: 31.9 g/dL (ref 30.0–36.0)
MCV: 105.6 fL — AB (ref 78.0–100.0)
Monocytes Absolute: 0.5 10*3/uL (ref 0.1–1.0)
Monocytes Relative: 8 %
NEUTROS ABS: 3.9 10*3/uL (ref 1.7–7.7)
NEUTROS PCT: 73 %
Platelets: 353 10*3/uL (ref 150–400)
RBC: 3.24 MIL/uL — AB (ref 3.87–5.11)
RDW: 13.1 % (ref 11.5–15.5)
WBC: 5.4 10*3/uL (ref 4.0–10.5)

## 2016-11-15 LAB — URINALYSIS, ROUTINE W REFLEX MICROSCOPIC
Bacteria, UA: NONE SEEN
Bilirubin Urine: NEGATIVE
Glucose, UA: NEGATIVE mg/dL
HGB URINE DIPSTICK: NEGATIVE
Ketones, ur: 5 mg/dL — AB
Nitrite: NEGATIVE
Protein, ur: NEGATIVE mg/dL
SPECIFIC GRAVITY, URINE: 1.011 (ref 1.005–1.030)
pH: 5 (ref 5.0–8.0)

## 2016-11-15 LAB — BASIC METABOLIC PANEL
ANION GAP: 10 (ref 5–15)
BUN: 9 mg/dL (ref 6–20)
CO2: 19 mmol/L — AB (ref 22–32)
Calcium: 9.1 mg/dL (ref 8.9–10.3)
Chloride: 111 mmol/L (ref 101–111)
Creatinine, Ser: 0.69 mg/dL (ref 0.44–1.00)
GFR calc Af Amer: >60 mL/min (ref >60)
GLUCOSE: 79 mg/dL (ref 65–99)
POTASSIUM: 3.5 mmol/L (ref 3.5–5.1)
Sodium: 140 mmol/L (ref 135–145)

## 2016-11-15 LAB — AMMONIA: AMMONIA: 23 umol/L (ref 9–35)

## 2016-11-15 LAB — RAPID URINE DRUG SCREEN, HOSP PERFORMED
AMPHETAMINES: NOT DETECTED
BARBITURATES: NOT DETECTED
BENZODIAZEPINES: NOT DETECTED
COCAINE: NOT DETECTED
Opiates: POSITIVE — AB
Tetrahydrocannabinol: NOT DETECTED

## 2016-11-15 LAB — ETHANOL

## 2016-11-15 MED ORDER — OXYCODONE-ACETAMINOPHEN 5-325 MG PO TABS
1.0000 | ORAL_TABLET | Freq: Once | ORAL | Status: AC
  Administered 2016-11-15: 1 via ORAL
  Filled 2016-11-15: qty 1

## 2016-11-15 MED ORDER — GABAPENTIN 300 MG PO CAPS: 300.0000 mg | ORAL_CAPSULE | Freq: Every day | ORAL | 1 refills | Status: DC

## 2016-11-15 NOTE — ED Triage Notes (Signed)
Pt here from home with c/o arms and legs jerking around , pt has history of same , EMS states that pt could walk without the jerky

## 2016-11-15 NOTE — ED Notes (Signed)
Neurology at the bedside

## 2016-11-15 NOTE — ED Provider Notes (Signed)
[facility]-EMERGENCY DEPT Provider Note   CSN: [id number] Arrival date & time: 11/15/16  1301     History   Chief Complaint Chief Complaint  Patient presents with  . Shaking    HPI Jasmine Benson is a 71 y.o. female.  HPI  Patient with pmhx significant for osteoarthritis presenting with jerking in both her feet and arms. She states one month ago she had a hip fracture and a hip replacement surgery. Patient states on 8/24 was seen for jerking. She had a CT head that was negative for any findings of tumor or acute stroke. Patient states that after the 25th she was doing well and was at home. Per her stepson that she was completely normal on Friday when he took her to her hair appointment. Then today when he came to see her she was twitching and moving all around. She denied any drug use or alcohol use but per chart review she voided drinking 14 glasses of wine per week. Patient denies any focal neurologic findings. States that she has numbness in all her fingertips of her bilateral hands. The patient denies any fevers, chills, chest pain, dysuria, nausea, vomiting. She denies taking any new medications. Patient does note being a little short of breath earlier today, denies any chest pain or cough. Patient does have some swelling in her legs right greater than left she has had this since her hip surgery. She had a venous doppler on to rule out DVT on 9/6 which was negative.  Past Medical History:  Diagnosis Date  . Arthritis    "probably in my hands; have some in my left knee" (10/05/2016)    Patient Active Problem List   Diagnosis Date Noted  . Hip fracture, right, closed, initial encounter (HCC) 10/03/2016    Past Surgical History:  Procedure Laterality Date  . APPENDECTOMY    . AUGMENTATION MAMMAPLASTY Bilateral   . CATARACT EXTRACTION W/ INTRAOCULAR LENS IMPLANT Right 07/13/2016  . FRACTURE SURGERY    . HIP ARTHROPLASTY Right 10/03/2016   Procedure: ARTHROPLASTY BIPOLAR HIP  (HEMIARTHROPLASTY);  Surgeon: Teryl Lucy, MD;  Location: [facility] OR;  Service: Orthopedics;  Laterality: Right;  . KNEE ARTHROSCOPY Left    "torn meniscus"  . SYMPATHECTOMY Left 1966   "lumbar; opened me up"  . TONSILLECTOMY    . TUBAL LIGATION    . VAGINAL HYSTERECTOMY      OB History    No data available       Home Medications    Prior to Admission medications   Medication Sig Start Date End Date Taking? Authorizing Provider  aspirin 325 MG EC tablet Take 325 mg by mouth daily.    [provider]  baclofen (LIORESAL) 10 MG tablet Take 1 tablet (10 mg total) by mouth 3 (three) times daily. As needed for muscle spasm 10/03/16   Teryl Lucy, MD  docusate sodium (COLACE) 100 MG capsule Take 100 mg by mouth daily.    [provider]  enoxaparin (LOVENOX) 40 MG/0.4ML injection Inject 0.4 mLs (40 mg total) into the skin daily. 10/03/16   Teryl Lucy, MD  ferrous sulfate 325 (65 FE) MG tablet Take 1 tablet (325 mg total) by mouth 2 (two) times daily with a meal. 10/07/16 11/06/16  Arrien, York Ram, MD  ibuprofen (MOTRIN IB) 200 MG tablet Take 1 tablet (200 mg total) by mouth every 6 (six) hours as needed for headache or moderate pain. 10/07/16 10/07/17  Arrien, York Ram, MD  ondansetron [facility]) 4  MG tablet Take 1 tablet (4 mg total) by mouth every 8 (eight) hours as needed for nausea or vomiting. 10/03/16   Teryl Lucy, MD  oxyCODONE-acetaminophen (PERCOCET) 10-325 MG tablet Take 1-2 tablets by mouth every 6 (six) hours as needed for pain. MAXIMUM TOTAL ACETAMINOPHEN DOSE IS 4000 MG PER DAY 10/08/16   Teryl Lucy, MD  sennosides-docusate sodium (SENOKOT-S) 8.6-50 MG tablet Take 2 tablets by mouth daily. 10/03/16   Teryl Lucy, MD    Family History History reviewed. No pertinent family history.  Social History Social History  Substance Use Topics  . Smoking status: Never Smoker  . Smokeless tobacco: Never Used  . Alcohol use 8.4 oz/week    14  Glasses of wine per week     Comment: 10/05/2016 "couple glasses of red wine/day"     Allergies   Patient has no known allergies.   Review of Systems Review of Systems  Constitutional: Negative for chills and fever.  HENT: Negative for congestion.   Respiratory: Positive for shortness of breath. Negative for cough.   Cardiovascular: Negative for chest pain and leg swelling.  Gastrointestinal: Negative for nausea and vomiting.  Genitourinary: Negative for dysuria and urgency.  Musculoskeletal: Negative for neck stiffness.  Skin: Negative for rash.  Neurological: Positive for numbness. Negative for dizziness, weakness and headaches.     Physical Exam Updated Vital Signs BP (!) 131/98 (BP Location: Left Arm)   Pulse (!) 101   Temp 98.1 F (36.7 C) (Oral)   Resp 19   LMP  (LMP Unknown)   SpO2 99%   Physical Exam  Constitutional: She appears well-developed and well-nourished. No distress.  HENT:  Head: Normocephalic and atraumatic.  Eyes: Conjunctivae are normal.  Neck: Neck supple.  Cardiovascular: Normal rate and regular rhythm.   No murmur heard. Pulmonary/Chest: Effort normal and breath sounds normal. No respiratory distress.  Abdominal: Soft. There is no tenderness.  Musculoskeletal: She exhibits edema.  Bilateral pitting edema 1+, calf tenderness right great than left, positive homan's sign  Neurological: She is alert.  Skin: Skin is warm and dry. Capillary refill takes less than 2 seconds.  Psychiatric: She has a normal mood and affect.  Nursing note and vitals reviewed.    ED Treatments / Results  Labs (all labs ordered are listed, but only abnormal results are displayed) Labs Reviewed  CBC WITH DIFFERENTIAL/PLATELET - Abnormal; Notable for the following:       Result Value   RBC 3.24 (*)    Hemoglobin 10.9 (*)    HCT 34.2 (*)    MCV 105.6 (*)    All other components within normal limits  BASIC METABOLIC PANEL - Abnormal; Notable for the following:     CO2 19 (*)    All other components within normal limits  RAPID URINE DRUG SCREEN, HOSP PERFORMED - Abnormal; Notable for the following:    Opiates POSITIVE (*)    All other components within normal limits  URINALYSIS, ROUTINE W REFLEX MICROSCOPIC - Abnormal; Notable for the following:    Ketones, ur 5 (*)    Leukocytes, UA SMALL (*)    Squamous Epithelial / LPF 0-5 (*)    All other components within normal limits  AMMONIA  ETHANOL  VITAMIN B1    EKG  EKG Interpretation None       Radiology Dg Chest 2 View  Result Date: 11/15/2016 CLINICAL DATA:  Shortness of breath EXAM: CHEST  2 VIEW COMPARISON:  Chest x-ray dated 10/03/2016. FINDINGS: Heart  size is upper normal. Overall cardiomediastinal silhouette is within normal limits. Lungs are clear. No pleural effusion or pneumothorax seen. Mild compression deformity of a midthoracic vertebral body, of uncertain age, most likely chronic. No acute appearing osseous abnormality. IMPRESSION: 1. No active cardiopulmonary disease. No evidence of pneumonia or pulmonary edema. 2. Mild compression deformity of a midthoracic vertebral body, of uncertain age without prior films for comparison, most likely chronic based on appearance. Any recent trauma or midline back pain? Electronically Signed   By: Bary Richard M.D.   On: 11/15/2016 14:57    Procedures Procedures (including critical care time)  Medications Ordered in ED Medications  oxyCODONE-acetaminophen (PERCOCET/ROXICET) 5-325 MG per tablet 1 tablet (not administered)     Initial Impression / Assessment and Plan / ED Course  I have reviewed the triage vital signs and the nursing notes.  Pertinent labs & imaging results that were available during my care of the patient were reviewed by me and considered in my medical decision making (see chart for details).   Patient presenting with shaking, acute onset. Differential less likely parkinson given no cog wheel rigidity or masked facies on  exam. No new medications.  She had lab work obtained including CBC, BMET, ammonia, B12 level, UDS, UA. MCV slightly elevated indicating, possibly folate/B12 deficiency which could be worked up as an outpatient. Patient with a previous CT head without acute findings obtained on 8/25 during a similarly episode of shaking. Neurology consulted to determined for further in put.   She also noted to have right lower extremity edema, tenderness greater in right compared to left however she has had this for the past month and was evaluated with a Doppler on 9/6 which was negative for DVT.  Final Clinical Impressions(s) / ED Diagnoses   Final diagnoses:  None    New Prescriptions New Prescriptions   No medications on file     Berton Bon, MD 11/15/16 1541    Linwood Dibbles, MD 11/15/16 1623

## 2016-11-15 NOTE — ED Notes (Signed)
Pt is able to hold arms and legs out straight and shaking/jerking stops

## 2016-11-15 NOTE — ED Notes (Signed)
Patient transported to X-ray 

## 2016-11-15 NOTE — Consult Note (Signed)
NEURO HOSPITALIST CONSULT NOTE   Requestig physician: Dr. Lynelle Doctor   Reason for Consult: Jasmine Benson   History obtained from:  Patient   HPI:                                                                                                                                          Jasmine Benson is an 71 y.o. female who recently had a right hip replacement and was placed on Oxycodone, Zofran and Baclofen. Since that time she has had mild abnormal movements however she is not sure if they are present at night. She has been off the Zofran since hospitalization, however recently she has been having increased movements. She does not take more than directed Oxycodone (per patient) but admits to possibly taking more than ususally baclofen recently. Currently she has anesthesia in all extremities UE mor ethan LE. When doing actions such as finger to nose and holding arms out the movements seem to stop.   Past Medical History:  Diagnosis Date  . Arthritis    "probably in my hands; have some in my left knee" (10/05/2016)    Past Surgical History:  Procedure Laterality Date  . APPENDECTOMY    . AUGMENTATION MAMMAPLASTY Bilateral   . CATARACT EXTRACTION W/ INTRAOCULAR LENS IMPLANT Right 07/13/2016  . FRACTURE SURGERY    . HIP ARTHROPLASTY Right 10/03/2016   Procedure: ARTHROPLASTY BIPOLAR HIP (HEMIARTHROPLASTY);  Surgeon: Teryl Lucy, MD;  Location: [facility] OR;  Service: Orthopedics;  Laterality: Right;  . KNEE ARTHROSCOPY Left    "torn meniscus"  . SYMPATHECTOMY Left 1966   "lumbar; opened me up"  . TONSILLECTOMY    . TUBAL LIGATION    . VAGINAL HYSTERECTOMY      History reviewed. No pertinent family history.    Social History:  reports that she has never smoked. She has never used smokeless tobacco. She reports that she drinks about 8.4 oz of alcohol per week . She reports that she does not use drugs.  No Known Allergies  MEDICATIONS:                                                                                                                      Current Facility-Administered Medications  Medication Dose Route Frequency Provider Last Rate  Last Dose  . oxyCODONE-acetaminophen (PERCOCET/ROXICET) 5-325 MG per tablet 1 tablet  1 tablet Oral Once Berton Bon, MD       Current Outpatient Prescriptions  Medication Sig Dispense Refill  . aspirin 325 MG EC tablet Take 325 mg by mouth daily.    . baclofen (LIORESAL) 10 MG tablet Take 1 tablet (10 mg total) by mouth 3 (three) times daily. As needed for muscle spasm 50 tablet 0  . docusate sodium (COLACE) 100 MG capsule Take 100 mg by mouth daily.    Marland Kitchen enoxaparin (LOVENOX) 40 MG/0.4ML injection Inject 0.4 mLs (40 mg total) into the skin daily. 21 Syringe 0  . ferrous sulfate 325 (65 FE) MG tablet Take 1 tablet (325 mg total) by mouth 2 (two) times daily with a meal. 60 tablet 0  . ibuprofen (MOTRIN IB) 200 MG tablet Take 1 tablet (200 mg total) by mouth every 6 (six) hours as needed for headache or moderate pain. 20 tablet 0  . ondansetron (ZOFRAN) 4 MG tablet Take 1 tablet (4 mg total) by mouth every 8 (eight) hours as needed for nausea or vomiting. 30 tablet 0  . oxyCODONE-acetaminophen (PERCOCET) 10-325 MG tablet Take 1-2 tablets by mouth every 6 (six) hours as needed for pain. MAXIMUM TOTAL ACETAMINOPHEN DOSE IS 4000 MG PER DAY 50 tablet 0  . sennosides-docusate sodium (SENOKOT-S) 8.6-50 MG tablet Take 2 tablets by mouth daily. 30 tablet 1      ROS:                                                                                                                                       History obtained from the patient  General ROS: negative for - chills, fatigue, fever, night sweats, weight gain or weight loss Psychological ROS: negative for - behavioral disorder, hallucinations, memory difficulties, mood swings or suicidal ideation Ophthalmic ROS: negative for - blurry vision, double vision, eye pain or  loss of vision ENT ROS: negative for - epistaxis, nasal discharge, oral lesions, sore throat, tinnitus or vertigo Allergy and Immunology ROS: negative for - hives or itchy/watery eyes Hematological and Lymphatic ROS: negative for - bleeding problems, bruising or swollen lymph nodes Endocrine ROS: negative for - galactorrhea, hair pattern changes, polydipsia/polyuria or temperature intolerance Respiratory ROS: negative for - cough, hemoptysis, shortness of breath or wheezing Cardiovascular ROS: negative for - chest pain, dyspnea on exertion, edema or irregular heartbeat Gastrointestinal ROS: negative for - abdominal pain, diarrhea, hematemesis, nausea/vomiting or stool incontinence Genito-Urinary ROS: negative for - dysuria, hematuria, incontinence or urinary frequency/urgency Musculoskeletal ROS: negative for - joint swelling or muscular weakness Neurological ROS: as noted in HPI Dermatological ROS: negative for rash and skin lesion changes   Blood pressure (!) 131/98, pulse (!) 101, temperature 98.1 F (36.7 C), temperature source Oral, resp. rate 19, SpO2 99 %.   Neurologic Examination:  HEENT-  Normocephalic, no lesions, without obvious abnormality.  Normal external eye and conjunctiva.  Normal TM's bilaterally.  Normal auditory canals and external ears. Normal external nose, mucus membranes and septum.  Normal pharynx. Cardiovascular- S1, S2 normal, pulses palpable throughout   Lungs- chest clear, no wheezing, rales, normal symmetric air entry Abdomen- normal findings: bowel sounds normal Extremities- no edema Lymph-no adenopathy palpable Musculoskeletal-no joint tenderness, deformity or swelling Skin-warm and dry, no hyperpigmentation, vitiligo, or suspicious lesions  Neurological Examination Mental Status: Alert, oriented, thought content appropriate.  Speech fluent without evidence  of aphasia.  Able to follow 3 step commands without difficulty. Cranial Nerves: II: ; Visual fields grossly normal,  III,IV, VI: ptosis not present, extra-ocular motions intact bilaterally pupils equal, round, reactive to light and accommodation V,VII: smile symmetric, facial light touch sensation normal bilaterally VIII: hearing normal bilaterally IX,X: uvula rises symmetrically XI: bilateral shoulder shrug XII: midline tongue extension Motor: Right : Upper extremity   5/5    Left:     Upper extremity   5/5  Lower extremity   5/5     Lower extremity   5/5 --akasthesia as noted above Tone and bulk:normal tone throughout; no atrophy noted Sensory: Pinprick and light touch intact throughout, bilaterally Deep Tendon Reflexes: 2+ and symmetric throughout Plantars: Right: downgoing   Left: downgoing Cerebellar: normal finger-to-nose,and normal heel-to-shin test Gait: not tested      Lab Results: Basic Metabolic Panel:  Recent Labs Lab 11/15/16 1339  NA 140  K 3.5  CL 111  CO2 19*  GLUCOSE 79  BUN 9  CREATININE 0.69  CALCIUM 9.1    Liver Function Tests: No results for input(s): AST, ALT, ALKPHOS, BILITOT, PROT, ALBUMIN in the last 168 hours. No results for input(s): LIPASE, AMYLASE in the last 168 hours.  Recent Labs Lab 11/15/16 1339  AMMONIA 23    CBC:  Recent Labs Lab 11/15/16 1339  WBC 5.4  NEUTROABS 3.9  HGB 10.9*  HCT 34.2*  MCV 105.6*  PLT 353    Cardiac Enzymes: No results for input(s): CKTOTAL, CKMB, CKMBINDEX, TROPONINI in the last 168 hours.  Lipid Panel: No results for input(s): CHOL, TRIG, HDL, CHOLHDL, VLDL, LDLCALC in the last 168 hours.  CBG: No results for input(s): GLUCAP in the last 168 hours.  Microbiology: No results found for this or any previous visit.  Coagulation Studies: No results for input(s): LABPROT, INR in the last 72 hours.  Imaging: Dg Chest 2 View  Result Date: 11/15/2016 CLINICAL DATA:  Shortness of breath  EXAM: CHEST  2 VIEW COMPARISON:  Chest x-ray dated 10/03/2016. FINDINGS: Heart size is upper normal. Overall cardiomediastinal silhouette is within normal limits. Lungs are clear. No pleural effusion or pneumothorax seen. Mild compression deformity of a midthoracic vertebral body, of uncertain age, most likely chronic. No acute appearing osseous abnormality. IMPRESSION: 1. No active cardiopulmonary disease. No evidence of pneumonia or pulmonary edema. 2. Mild compression deformity of a midthoracic vertebral body, of uncertain age without prior films for comparison, most likely chronic based on appearance. Any recent trauma or midline back pain? Electronically Signed   By: Bary Richard M.D.   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 She is on two medications which could be responsible. I discussed her options and she would prefer to stop  baclofen rather than oxycodone. I advised that if it continues, she may neeed to consider stopping the oxycodone.    1) D/C baclofen 2) No further recs at this time.   Ritta Slot, MD [facility] [phone]  If 7pm- 7am, please page neurology on call as listed in [facility]ION.

## 2016-11-15 NOTE — Discharge Instructions (Signed)
Stop taking the baclofen.  Follow up with your primary care doctor or consider seeing a neurologist if the symptoms persist

## 2016-12-19 ENCOUNTER — Encounter (HOSPITAL_COMMUNITY): Payer: Self-pay | Admitting: Orthopedic Surgery

## 2016-12-25 DIAGNOSIS — G2571 Drug induced akathisia: Secondary | ICD-10-CM | POA: Insufficient documentation

## 2017-02-23 DIAGNOSIS — S72001D Fracture of unspecified part of neck of right femur, subsequent encounter for closed fracture with routine healing: Secondary | ICD-10-CM | POA: Diagnosis not present

## 2017-03-02 DIAGNOSIS — F418 Other specified anxiety disorders: Secondary | ICD-10-CM | POA: Diagnosis not present

## 2017-03-02 DIAGNOSIS — F5101 Primary insomnia: Secondary | ICD-10-CM | POA: Diagnosis not present

## 2017-03-02 DIAGNOSIS — E782 Mixed hyperlipidemia: Secondary | ICD-10-CM | POA: Diagnosis not present

## 2017-03-14 DIAGNOSIS — S72001A Fracture of unspecified part of neck of right femur, initial encounter for closed fracture: Secondary | ICD-10-CM | POA: Diagnosis not present

## 2017-04-01 DIAGNOSIS — I8311 Varicose veins of right lower extremity with inflammation: Secondary | ICD-10-CM | POA: Diagnosis not present

## 2017-04-01 DIAGNOSIS — L578 Other skin changes due to chronic exposure to nonionizing radiation: Secondary | ICD-10-CM | POA: Diagnosis not present

## 2017-04-01 DIAGNOSIS — I8312 Varicose veins of left lower extremity with inflammation: Secondary | ICD-10-CM | POA: Diagnosis not present

## 2017-04-06 DIAGNOSIS — S72001D Fracture of unspecified part of neck of right femur, subsequent encounter for closed fracture with routine healing: Secondary | ICD-10-CM | POA: Diagnosis not present

## 2017-04-14 DIAGNOSIS — S72001A Fracture of unspecified part of neck of right femur, initial encounter for closed fracture: Secondary | ICD-10-CM | POA: Diagnosis not present

## 2017-05-12 DIAGNOSIS — S72001A Fracture of unspecified part of neck of right femur, initial encounter for closed fracture: Secondary | ICD-10-CM | POA: Diagnosis not present

## 2017-06-12 DIAGNOSIS — S72001A Fracture of unspecified part of neck of right femur, initial encounter for closed fracture: Secondary | ICD-10-CM | POA: Diagnosis not present

## 2017-06-20 DIAGNOSIS — S72001D Fracture of unspecified part of neck of right femur, subsequent encounter for closed fracture with routine healing: Secondary | ICD-10-CM | POA: Diagnosis not present

## 2017-06-20 DIAGNOSIS — L578 Other skin changes due to chronic exposure to nonionizing radiation: Secondary | ICD-10-CM | POA: Diagnosis not present

## 2017-07-12 DIAGNOSIS — S72001A Fracture of unspecified part of neck of right femur, initial encounter for closed fracture: Secondary | ICD-10-CM | POA: Diagnosis not present

## 2017-08-12 DIAGNOSIS — S72001A Fracture of unspecified part of neck of right femur, initial encounter for closed fracture: Secondary | ICD-10-CM | POA: Diagnosis not present

## 2017-09-11 DIAGNOSIS — S72001A Fracture of unspecified part of neck of right femur, initial encounter for closed fracture: Secondary | ICD-10-CM | POA: Diagnosis not present

## 2017-09-19 DIAGNOSIS — R222 Localized swelling, mass and lump, trunk: Secondary | ICD-10-CM | POA: Diagnosis not present

## 2017-09-29 DIAGNOSIS — H0014 Chalazion left upper eyelid: Secondary | ICD-10-CM | POA: Diagnosis not present

## 2017-10-03 DIAGNOSIS — S72001D Fracture of unspecified part of neck of right femur, subsequent encounter for closed fracture with routine healing: Secondary | ICD-10-CM | POA: Diagnosis not present

## 2017-10-11 DIAGNOSIS — E538 Deficiency of other specified B group vitamins: Secondary | ICD-10-CM | POA: Diagnosis not present

## 2017-10-11 DIAGNOSIS — E782 Mixed hyperlipidemia: Secondary | ICD-10-CM | POA: Diagnosis not present

## 2017-10-11 DIAGNOSIS — F5101 Primary insomnia: Secondary | ICD-10-CM | POA: Diagnosis not present

## 2017-10-11 DIAGNOSIS — F411 Generalized anxiety disorder: Secondary | ICD-10-CM | POA: Diagnosis not present

## 2017-10-11 DIAGNOSIS — F3341 Major depressive disorder, recurrent, in partial remission: Secondary | ICD-10-CM | POA: Diagnosis not present

## 2018-06-08 DIAGNOSIS — Z1239 Encounter for other screening for malignant neoplasm of breast: Secondary | ICD-10-CM | POA: Diagnosis not present

## 2018-06-08 DIAGNOSIS — E782 Mixed hyperlipidemia: Secondary | ICD-10-CM | POA: Diagnosis not present

## 2018-06-08 DIAGNOSIS — F411 Generalized anxiety disorder: Secondary | ICD-10-CM | POA: Diagnosis not present

## 2018-06-08 DIAGNOSIS — F3341 Major depressive disorder, recurrent, in partial remission: Secondary | ICD-10-CM | POA: Diagnosis not present

## 2018-06-08 DIAGNOSIS — Z0000 Encounter for general adult medical examination without abnormal findings: Secondary | ICD-10-CM | POA: Diagnosis not present

## 2018-06-08 DIAGNOSIS — F5101 Primary insomnia: Secondary | ICD-10-CM | POA: Diagnosis not present

## 2018-06-08 DIAGNOSIS — Z1211 Encounter for screening for malignant neoplasm of colon: Secondary | ICD-10-CM | POA: Diagnosis not present

## 2018-06-08 DIAGNOSIS — E2839 Other primary ovarian failure: Secondary | ICD-10-CM | POA: Diagnosis not present

## 2018-06-09 ENCOUNTER — Other Ambulatory Visit: Payer: Self-pay | Admitting: Family Medicine

## 2018-06-09 DIAGNOSIS — E2839 Other primary ovarian failure: Secondary | ICD-10-CM

## 2018-06-09 DIAGNOSIS — Z1231 Encounter for screening mammogram for malignant neoplasm of breast: Secondary | ICD-10-CM

## 2018-12-06 DIAGNOSIS — F411 Generalized anxiety disorder: Secondary | ICD-10-CM | POA: Diagnosis not present

## 2018-12-06 DIAGNOSIS — F5101 Primary insomnia: Secondary | ICD-10-CM | POA: Diagnosis not present

## 2018-12-06 DIAGNOSIS — F3341 Major depressive disorder, recurrent, in partial remission: Secondary | ICD-10-CM | POA: Diagnosis not present

## 2018-12-06 DIAGNOSIS — E782 Mixed hyperlipidemia: Secondary | ICD-10-CM | POA: Diagnosis not present

## 2018-12-12 IMAGING — DX DG HIP (WITH OR WITHOUT PELVIS) 1V PORT*R*
2 series · 2 of 2 positions shown · non-contrast
Comparison: Radiographs from earlier today.

CLINICAL DATA: Hip fracture.

EXAM:
DG HIP (WITH OR WITHOUT PELVIS) 1V PORT RIGHT

[pelvis ap]
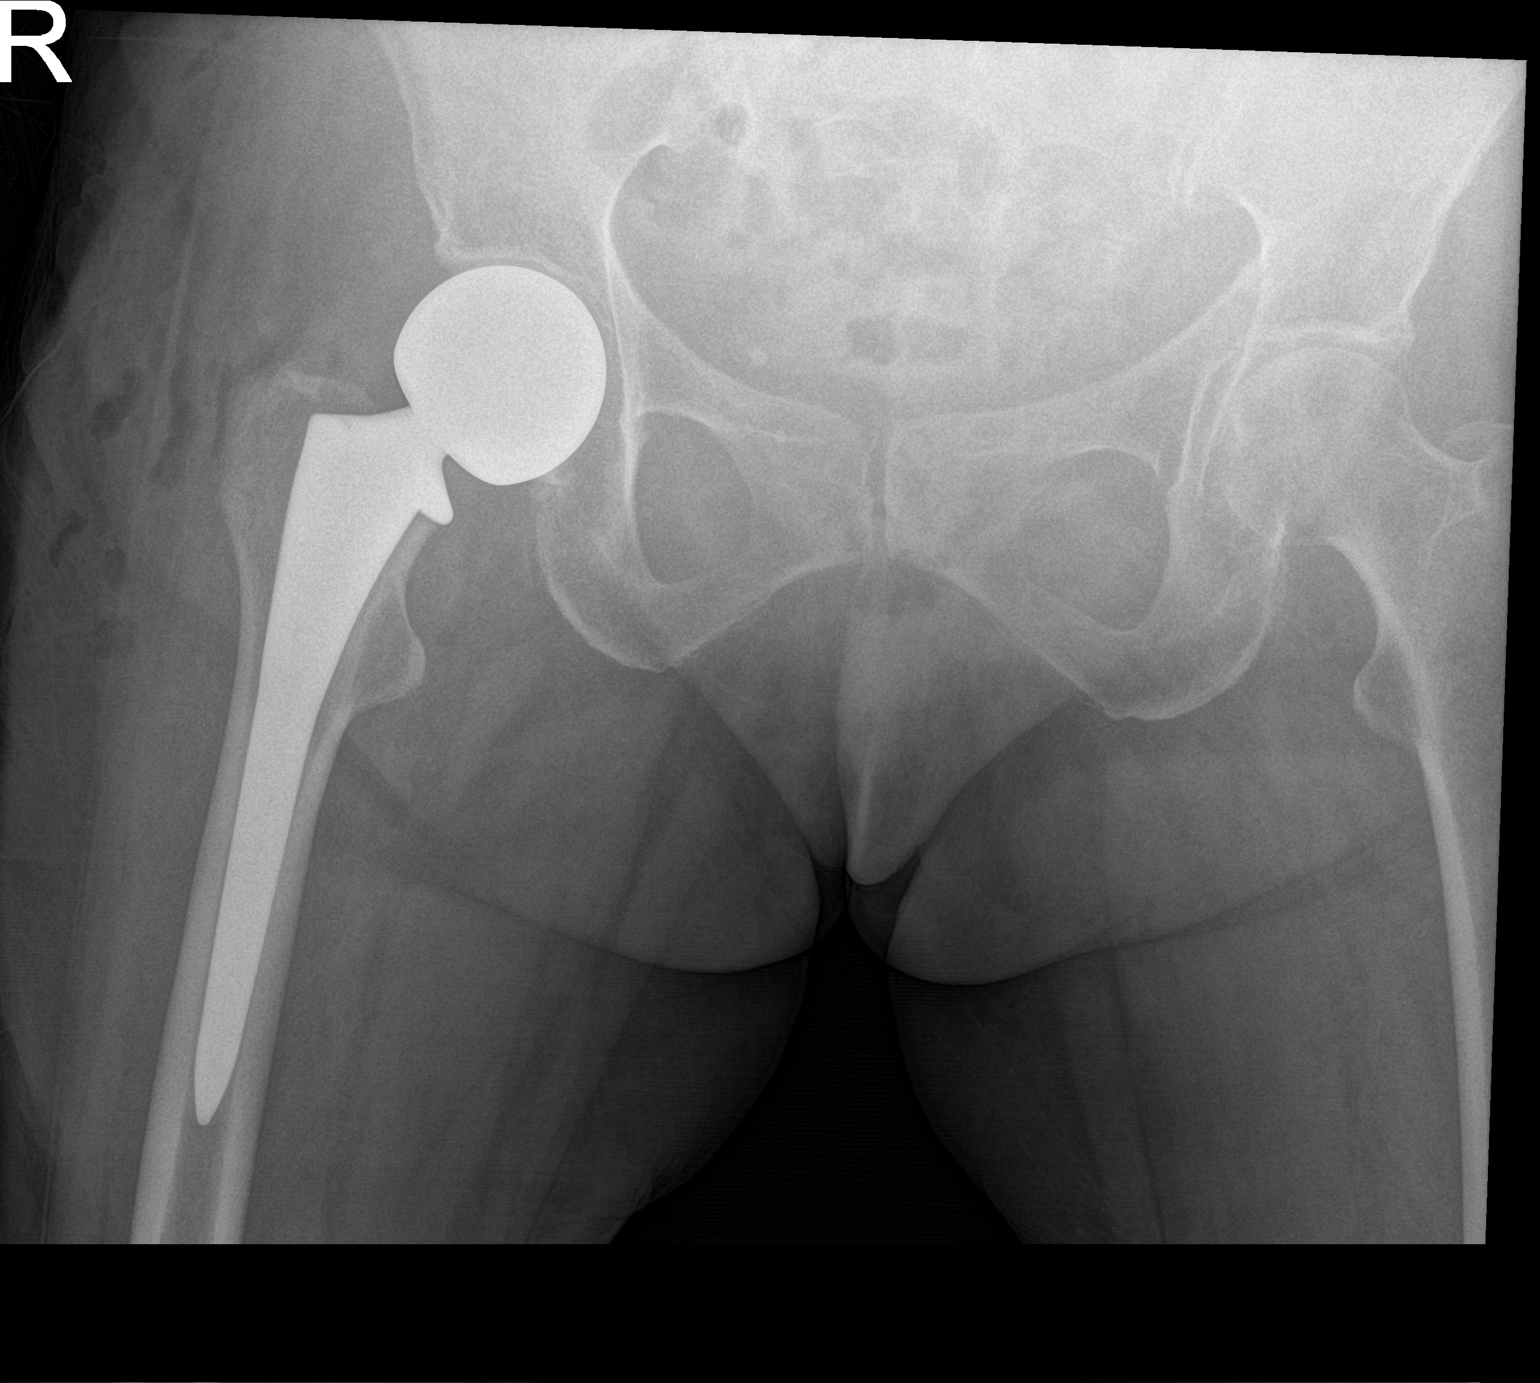

[hip lat]
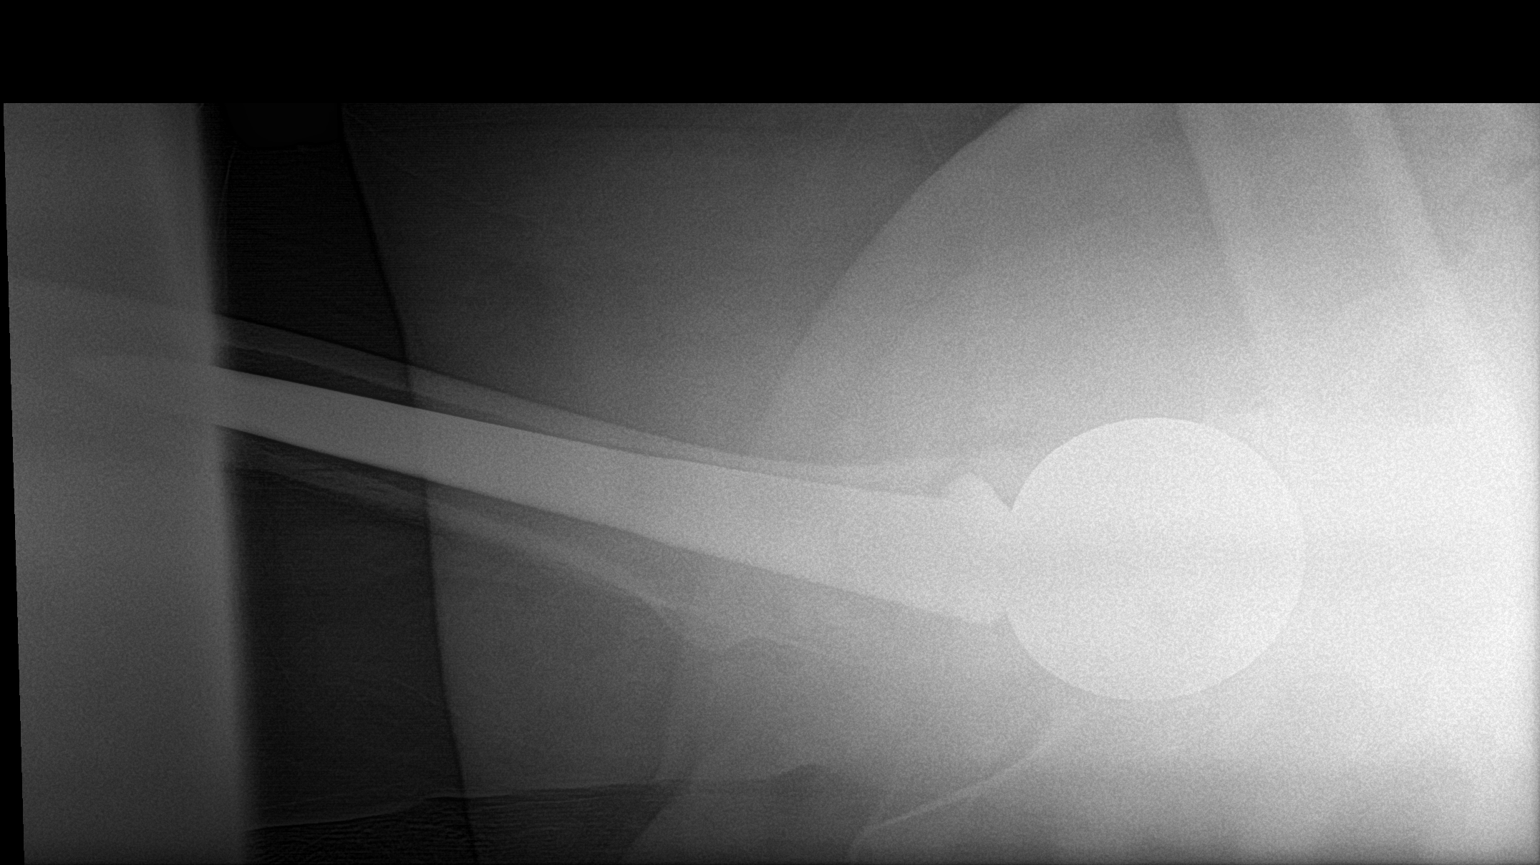

[2 of 2 positions shown; findings below may reference images not displayed]

FINDINGS: There is a bipolar hip prosthesis in place. The femoral component is
well seated without complicating features. The pubic symphysis,
pubic bones and left hip joint are maintained.
IMPRESSION: Bipolar right hip prosthesis in good position without complicating
features.

## 2018-12-12 IMAGING — CR DG HIP (WITH OR WITHOUT PELVIS) 2-3V*R*
4 series · 4 of 4 positions shown · non-contrast
Comparison: None.

CLINICAL DATA: Recent slip and fall with right hip pain, initial
encounter

EXAM:
DG HIP (WITH OR WITHOUT PELVIS) 2-3V RIGHT

[x pelvis (1 of 2)]
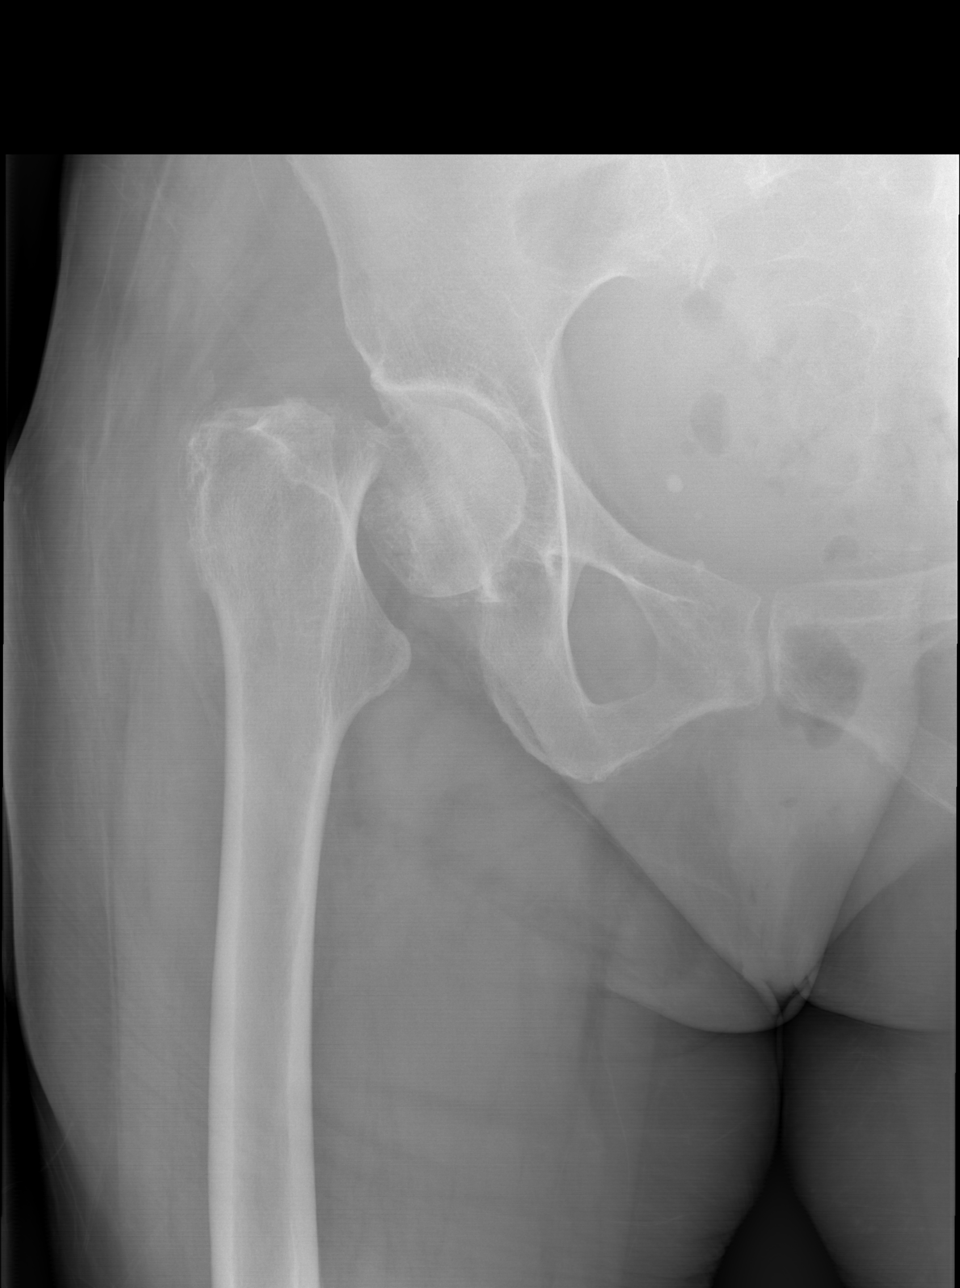

[x pelvis (2 of 2)]
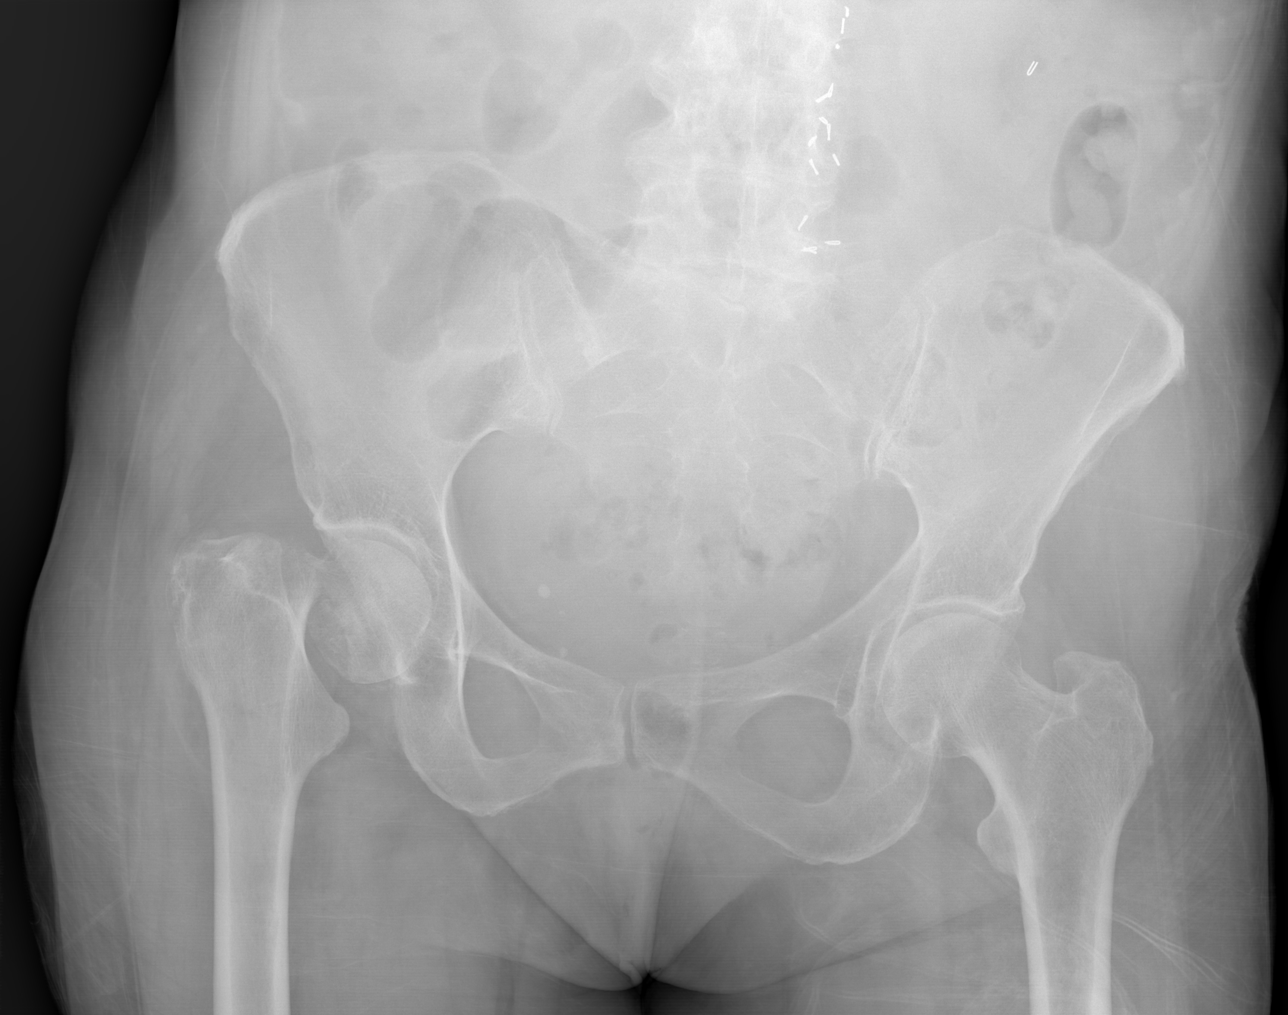

[w hip lat right (1 of 2)]
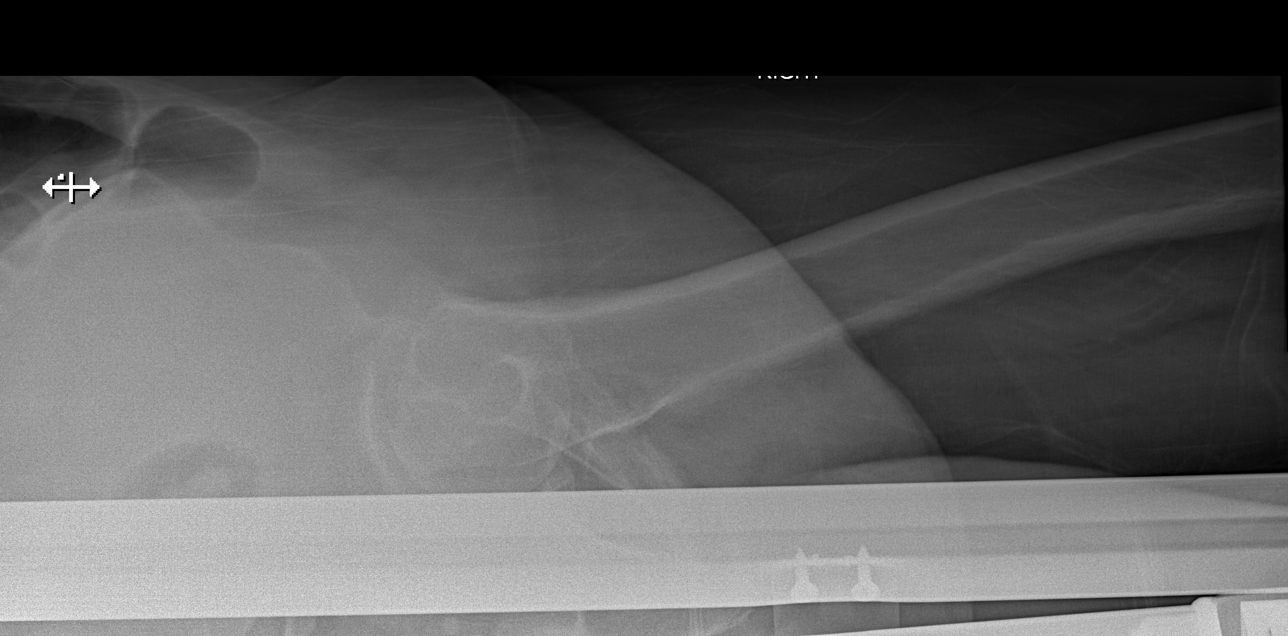

[w hip lat right (2 of 2)]
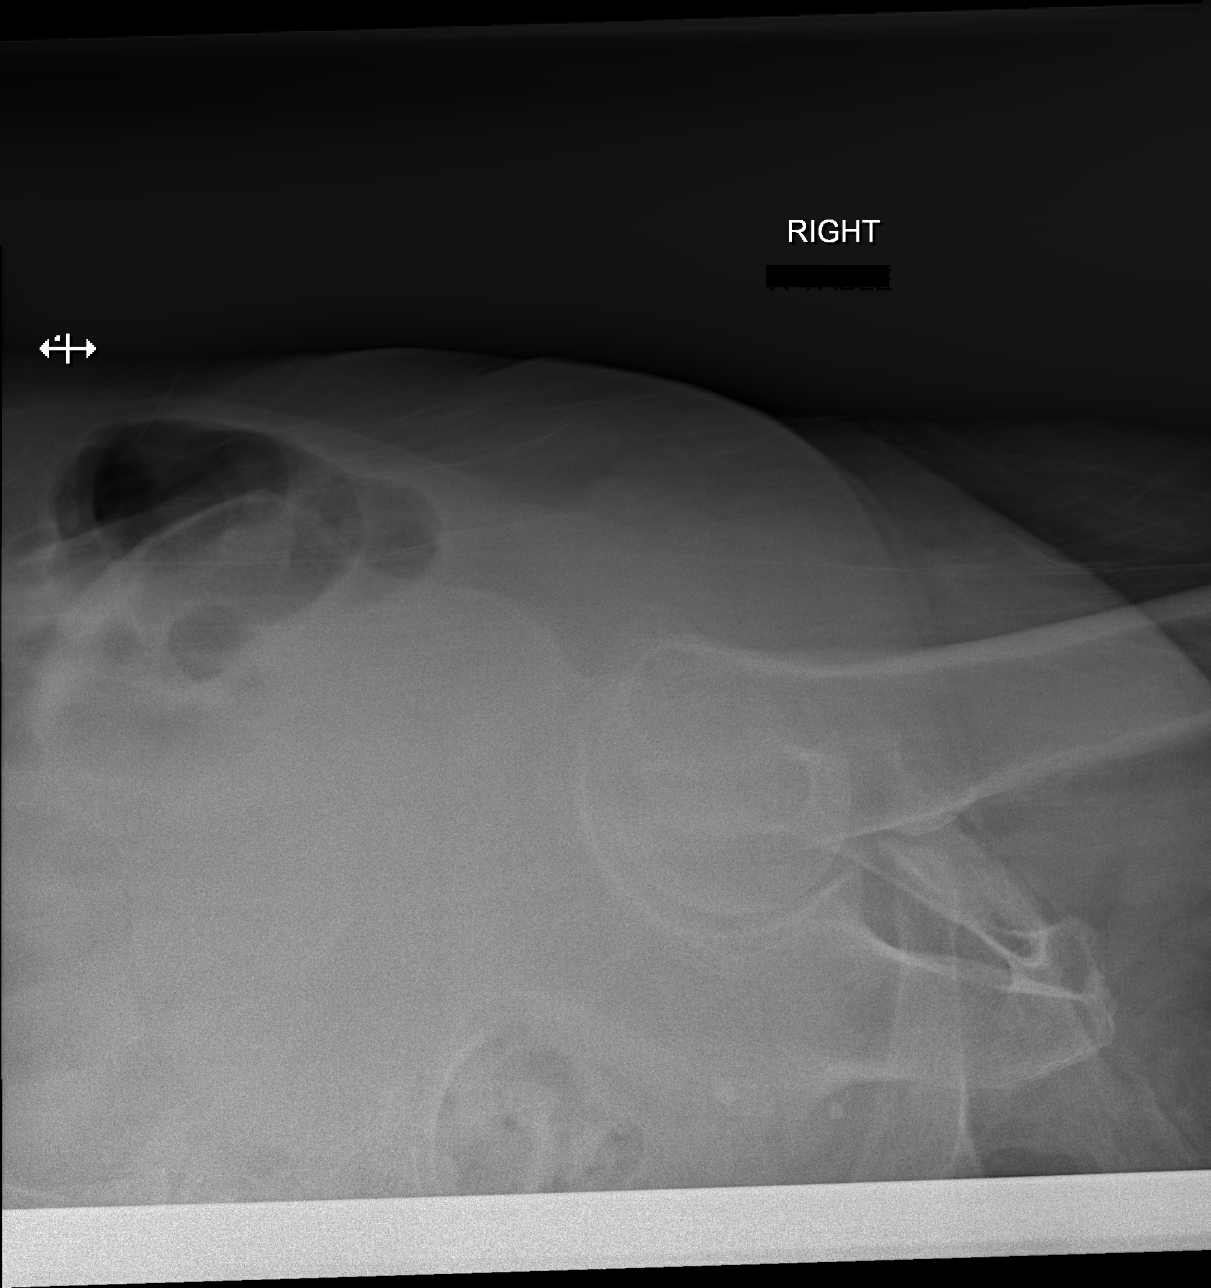

[4 of 4 positions shown; findings below may reference images not displayed]

FINDINGS: There is a subcapital femoral neck fracture with impaction and
angulation at the fracture site. Pelvic ring is intact. The proximal
femur is otherwise within normal limits.
IMPRESSION: Subcapital right femoral neck fracture

## 2018-12-14 DIAGNOSIS — F3341 Major depressive disorder, recurrent, in partial remission: Secondary | ICD-10-CM | POA: Diagnosis not present

## 2018-12-14 DIAGNOSIS — E782 Mixed hyperlipidemia: Secondary | ICD-10-CM | POA: Diagnosis not present

## 2018-12-20 DIAGNOSIS — Z20828 Contact with and (suspected) exposure to other viral communicable diseases: Secondary | ICD-10-CM | POA: Diagnosis not present

## 2018-12-22 DIAGNOSIS — Z20828 Contact with and (suspected) exposure to other viral communicable diseases: Secondary | ICD-10-CM | POA: Diagnosis not present

## 2019-01-03 DIAGNOSIS — F3341 Major depressive disorder, recurrent, in partial remission: Secondary | ICD-10-CM | POA: Diagnosis not present

## 2019-01-03 DIAGNOSIS — E782 Mixed hyperlipidemia: Secondary | ICD-10-CM | POA: Diagnosis not present

## 2019-03-22 DIAGNOSIS — F3341 Major depressive disorder, recurrent, in partial remission: Secondary | ICD-10-CM | POA: Diagnosis not present

## 2019-03-22 DIAGNOSIS — E782 Mixed hyperlipidemia: Secondary | ICD-10-CM | POA: Diagnosis not present

## 2019-06-18 DIAGNOSIS — F3341 Major depressive disorder, recurrent, in partial remission: Secondary | ICD-10-CM | POA: Diagnosis not present

## 2019-06-18 DIAGNOSIS — E782 Mixed hyperlipidemia: Secondary | ICD-10-CM | POA: Diagnosis not present

## 2019-07-11 DIAGNOSIS — E559 Vitamin D deficiency, unspecified: Secondary | ICD-10-CM | POA: Diagnosis not present

## 2019-07-11 DIAGNOSIS — R140 Abdominal distension (gaseous): Secondary | ICD-10-CM | POA: Diagnosis not present

## 2019-07-11 DIAGNOSIS — F411 Generalized anxiety disorder: Secondary | ICD-10-CM | POA: Diagnosis not present

## 2019-07-11 DIAGNOSIS — E782 Mixed hyperlipidemia: Secondary | ICD-10-CM | POA: Diagnosis not present

## 2019-07-11 DIAGNOSIS — F5101 Primary insomnia: Secondary | ICD-10-CM | POA: Diagnosis not present

## 2019-07-11 DIAGNOSIS — F3341 Major depressive disorder, recurrent, in partial remission: Secondary | ICD-10-CM | POA: Diagnosis not present

## 2019-07-11 DIAGNOSIS — Z0000 Encounter for general adult medical examination without abnormal findings: Secondary | ICD-10-CM | POA: Diagnosis not present

## 2019-07-11 DIAGNOSIS — Z23 Encounter for immunization: Secondary | ICD-10-CM | POA: Diagnosis not present

## 2019-07-11 DIAGNOSIS — Z7289 Other problems related to lifestyle: Secondary | ICD-10-CM | POA: Diagnosis not present

## 2019-07-11 DIAGNOSIS — E538 Deficiency of other specified B group vitamins: Secondary | ICD-10-CM | POA: Diagnosis not present

## 2019-08-16 DIAGNOSIS — F3341 Major depressive disorder, recurrent, in partial remission: Secondary | ICD-10-CM | POA: Diagnosis not present

## 2019-08-16 DIAGNOSIS — E782 Mixed hyperlipidemia: Secondary | ICD-10-CM | POA: Diagnosis not present

## 2019-08-30 DIAGNOSIS — H10412 Chronic giant papillary conjunctivitis, left eye: Secondary | ICD-10-CM | POA: Diagnosis not present

## 2019-10-22 DIAGNOSIS — F3341 Major depressive disorder, recurrent, in partial remission: Secondary | ICD-10-CM | POA: Diagnosis not present

## 2019-10-22 DIAGNOSIS — E782 Mixed hyperlipidemia: Secondary | ICD-10-CM | POA: Diagnosis not present

## 2019-11-08 DIAGNOSIS — J0191 Acute recurrent sinusitis, unspecified: Secondary | ICD-10-CM | POA: Diagnosis not present

## 2019-11-22 DIAGNOSIS — E782 Mixed hyperlipidemia: Secondary | ICD-10-CM | POA: Diagnosis not present

## 2019-11-22 DIAGNOSIS — F3341 Major depressive disorder, recurrent, in partial remission: Secondary | ICD-10-CM | POA: Diagnosis not present

## 2019-12-28 DIAGNOSIS — H524 Presbyopia: Secondary | ICD-10-CM | POA: Diagnosis not present

## 2019-12-28 DIAGNOSIS — H1789 Other corneal scars and opacities: Secondary | ICD-10-CM | POA: Diagnosis not present

## 2019-12-28 DIAGNOSIS — H25812 Combined forms of age-related cataract, left eye: Secondary | ICD-10-CM | POA: Diagnosis not present

## 2019-12-28 DIAGNOSIS — Z961 Presence of intraocular lens: Secondary | ICD-10-CM | POA: Diagnosis not present

## 2020-01-15 DIAGNOSIS — E782 Mixed hyperlipidemia: Secondary | ICD-10-CM | POA: Diagnosis not present

## 2020-01-15 DIAGNOSIS — F3341 Major depressive disorder, recurrent, in partial remission: Secondary | ICD-10-CM | POA: Diagnosis not present

## 2020-01-16 DIAGNOSIS — Z961 Presence of intraocular lens: Secondary | ICD-10-CM | POA: Diagnosis not present

## 2020-01-16 DIAGNOSIS — H25812 Combined forms of age-related cataract, left eye: Secondary | ICD-10-CM | POA: Diagnosis not present

## 2020-01-16 DIAGNOSIS — H1789 Other corneal scars and opacities: Secondary | ICD-10-CM | POA: Diagnosis not present

## 2020-01-28 DIAGNOSIS — E782 Mixed hyperlipidemia: Secondary | ICD-10-CM | POA: Diagnosis not present

## 2020-01-28 DIAGNOSIS — F3341 Major depressive disorder, recurrent, in partial remission: Secondary | ICD-10-CM | POA: Diagnosis not present

## 2020-04-08 DIAGNOSIS — H268 Other specified cataract: Secondary | ICD-10-CM | POA: Diagnosis not present

## 2020-04-08 DIAGNOSIS — H5703 Miosis: Secondary | ICD-10-CM | POA: Diagnosis not present

## 2020-04-08 DIAGNOSIS — H21562 Pupillary abnormality, left eye: Secondary | ICD-10-CM | POA: Diagnosis not present

## 2020-04-08 DIAGNOSIS — H25812 Combined forms of age-related cataract, left eye: Secondary | ICD-10-CM | POA: Diagnosis not present

## 2020-04-08 DIAGNOSIS — H179 Unspecified corneal scar and opacity: Secondary | ICD-10-CM | POA: Diagnosis not present

## 2020-04-16 DIAGNOSIS — F3341 Major depressive disorder, recurrent, in partial remission: Secondary | ICD-10-CM | POA: Diagnosis not present

## 2020-04-16 DIAGNOSIS — E782 Mixed hyperlipidemia: Secondary | ICD-10-CM | POA: Diagnosis not present

## 2020-05-07 DIAGNOSIS — F5101 Primary insomnia: Secondary | ICD-10-CM | POA: Diagnosis not present

## 2020-05-07 DIAGNOSIS — E782 Mixed hyperlipidemia: Secondary | ICD-10-CM | POA: Diagnosis not present

## 2020-05-07 DIAGNOSIS — R079 Chest pain, unspecified: Secondary | ICD-10-CM | POA: Diagnosis not present

## 2020-05-07 DIAGNOSIS — R0781 Pleurodynia: Secondary | ICD-10-CM | POA: Diagnosis not present

## 2020-06-05 DIAGNOSIS — H1789 Other corneal scars and opacities: Secondary | ICD-10-CM | POA: Diagnosis not present

## 2020-06-05 DIAGNOSIS — H26491 Other secondary cataract, right eye: Secondary | ICD-10-CM | POA: Diagnosis not present

## 2020-06-05 DIAGNOSIS — H35372 Puckering of macula, left eye: Secondary | ICD-10-CM | POA: Diagnosis not present

## 2020-07-15 DIAGNOSIS — E782 Mixed hyperlipidemia: Secondary | ICD-10-CM | POA: Diagnosis not present

## 2020-07-15 DIAGNOSIS — F3341 Major depressive disorder, recurrent, in partial remission: Secondary | ICD-10-CM | POA: Diagnosis not present

## 2020-08-07 DIAGNOSIS — E538 Deficiency of other specified B group vitamins: Secondary | ICD-10-CM | POA: Diagnosis not present

## 2020-08-07 DIAGNOSIS — R718 Other abnormality of red blood cells: Secondary | ICD-10-CM | POA: Diagnosis not present

## 2020-08-07 DIAGNOSIS — E559 Vitamin D deficiency, unspecified: Secondary | ICD-10-CM | POA: Diagnosis not present

## 2020-08-07 DIAGNOSIS — F5101 Primary insomnia: Secondary | ICD-10-CM | POA: Diagnosis not present

## 2020-08-07 DIAGNOSIS — Z0000 Encounter for general adult medical examination without abnormal findings: Secondary | ICD-10-CM | POA: Diagnosis not present

## 2020-08-07 DIAGNOSIS — R5383 Other fatigue: Secondary | ICD-10-CM | POA: Diagnosis not present

## 2020-08-07 DIAGNOSIS — E782 Mixed hyperlipidemia: Secondary | ICD-10-CM | POA: Diagnosis not present

## 2020-08-07 DIAGNOSIS — F3341 Major depressive disorder, recurrent, in partial remission: Secondary | ICD-10-CM | POA: Diagnosis not present

## 2021-01-12 DIAGNOSIS — E782 Mixed hyperlipidemia: Secondary | ICD-10-CM | POA: Diagnosis not present

## 2021-01-12 DIAGNOSIS — F3341 Major depressive disorder, recurrent, in partial remission: Secondary | ICD-10-CM | POA: Diagnosis not present

## 2021-05-15 ENCOUNTER — Emergency Department (HOSPITAL_COMMUNITY): Payer: Medicare HMO

## 2021-05-15 ENCOUNTER — Other Ambulatory Visit: Payer: Self-pay

## 2021-05-15 ENCOUNTER — Inpatient Hospital Stay (HOSPITAL_COMMUNITY)
Admission: EM | Admit: 2021-05-15 | Discharge: 2021-05-21 | DRG: 536 | Disposition: A | Discharge status: Skilled Nursing Facility | Payer: Medicare HMO | Attending: Internal Medicine | Admitting: Internal Medicine

## 2021-05-15 ENCOUNTER — Encounter (HOSPITAL_COMMUNITY): Payer: Self-pay | Admitting: Oncology

## 2021-05-15 DIAGNOSIS — E871 Hypo-osmolality and hyponatremia: Secondary | ICD-10-CM | POA: Diagnosis present

## 2021-05-15 DIAGNOSIS — S32129A Unspecified Zone II fracture of sacrum, initial encounter for closed fracture: Secondary | ICD-10-CM | POA: Diagnosis present

## 2021-05-15 DIAGNOSIS — D7589 Other specified diseases of blood and blood-forming organs: Secondary | ICD-10-CM | POA: Diagnosis present

## 2021-05-15 DIAGNOSIS — S2242XA Multiple fractures of ribs, left side, initial encounter for closed fracture: Secondary | ICD-10-CM | POA: Diagnosis not present

## 2021-05-15 DIAGNOSIS — S2232XA Fracture of one rib, left side, initial encounter for closed fracture: Secondary | ICD-10-CM | POA: Diagnosis present

## 2021-05-15 DIAGNOSIS — R7401 Elevation of levels of liver transaminase levels: Secondary | ICD-10-CM | POA: Diagnosis present

## 2021-05-15 DIAGNOSIS — D649 Anemia, unspecified: Secondary | ICD-10-CM | POA: Diagnosis present

## 2021-05-15 DIAGNOSIS — S32592A Other specified fracture of left pubis, initial encounter for closed fracture: Secondary | ICD-10-CM | POA: Diagnosis not present

## 2021-05-15 DIAGNOSIS — R509 Fever, unspecified: Secondary | ICD-10-CM | POA: Diagnosis not present

## 2021-05-15 DIAGNOSIS — S2239XA Fracture of one rib, unspecified side, initial encounter for closed fracture: Secondary | ICD-10-CM | POA: Diagnosis not present

## 2021-05-15 DIAGNOSIS — Z8249 Family history of ischemic heart disease and other diseases of the circulatory system: Secondary | ICD-10-CM

## 2021-05-15 DIAGNOSIS — E875 Hyperkalemia: Secondary | ICD-10-CM | POA: Diagnosis not present

## 2021-05-15 DIAGNOSIS — W010XXA Fall on same level from slipping, tripping and stumbling without subsequent striking against object, initial encounter: Secondary | ICD-10-CM | POA: Diagnosis present

## 2021-05-15 DIAGNOSIS — I10 Essential (primary) hypertension: Secondary | ICD-10-CM | POA: Diagnosis not present

## 2021-05-15 DIAGNOSIS — K5730 Diverticulosis of large intestine without perforation or abscess without bleeding: Secondary | ICD-10-CM | POA: Diagnosis not present

## 2021-05-15 DIAGNOSIS — Z7982 Long term (current) use of aspirin: Secondary | ICD-10-CM

## 2021-05-15 DIAGNOSIS — M62838 Other muscle spasm: Secondary | ICD-10-CM | POA: Diagnosis present

## 2021-05-15 DIAGNOSIS — Z8781 Personal history of (healed) traumatic fracture: Secondary | ICD-10-CM | POA: Diagnosis not present

## 2021-05-15 DIAGNOSIS — Y92000 Kitchen of unspecified non-institutional (private) residence as  the place of occurrence of the external cause: Secondary | ICD-10-CM

## 2021-05-15 DIAGNOSIS — R52 Pain, unspecified: Secondary | ICD-10-CM

## 2021-05-15 DIAGNOSIS — S3210XA Unspecified fracture of sacrum, initial encounter for closed fracture: Secondary | ICD-10-CM

## 2021-05-15 DIAGNOSIS — E876 Hypokalemia: Secondary | ICD-10-CM | POA: Diagnosis present

## 2021-05-15 DIAGNOSIS — Z90710 Acquired absence of both cervix and uterus: Secondary | ICD-10-CM | POA: Diagnosis not present

## 2021-05-15 DIAGNOSIS — Z043 Encounter for examination and observation following other accident: Secondary | ICD-10-CM | POA: Diagnosis not present

## 2021-05-15 DIAGNOSIS — S32599A Other specified fracture of unspecified pubis, initial encounter for closed fracture: Secondary | ICD-10-CM | POA: Diagnosis not present

## 2021-05-15 DIAGNOSIS — M7989 Other specified soft tissue disorders: Secondary | ICD-10-CM | POA: Diagnosis not present

## 2021-05-15 DIAGNOSIS — Z7401 Bed confinement status: Secondary | ICD-10-CM | POA: Diagnosis not present

## 2021-05-15 DIAGNOSIS — Z96641 Presence of right artificial hip joint: Secondary | ICD-10-CM | POA: Diagnosis present

## 2021-05-15 DIAGNOSIS — W19XXXA Unspecified fall, initial encounter: Secondary | ICD-10-CM | POA: Diagnosis not present

## 2021-05-15 DIAGNOSIS — F32A Depression, unspecified: Secondary | ICD-10-CM | POA: Diagnosis not present

## 2021-05-15 DIAGNOSIS — S32599S Other specified fracture of unspecified pubis, sequela: Secondary | ICD-10-CM | POA: Diagnosis not present

## 2021-05-15 DIAGNOSIS — R7989 Other specified abnormal findings of blood chemistry: Secondary | ICD-10-CM

## 2021-05-15 DIAGNOSIS — M79605 Pain in left leg: Secondary | ICD-10-CM | POA: Diagnosis not present

## 2021-05-15 DIAGNOSIS — Z79899 Other long term (current) drug therapy: Secondary | ICD-10-CM

## 2021-05-15 DIAGNOSIS — Z743 Need for continuous supervision: Secondary | ICD-10-CM | POA: Diagnosis not present

## 2021-05-15 DIAGNOSIS — R531 Weakness: Secondary | ICD-10-CM | POA: Diagnosis not present

## 2021-05-15 LAB — COMPREHENSIVE METABOLIC PANEL
ALT: 37 U/L (ref 0–44)
AST: 51 U/L — ABNORMAL HIGH (ref 15–41)
Albumin: 4 g/dL (ref 3.5–5.0)
Alkaline Phosphatase: 57 U/L (ref 38–126)
Anion gap: 8 (ref 5–15)
BUN: 18 mg/dL (ref 8–23)
CO2: 25 mmol/L (ref 22–32)
Calcium: 9.2 mg/dL (ref 8.9–10.3)
Chloride: 101 mmol/L (ref 98–111)
Creatinine, Ser: 0.72 mg/dL (ref 0.44–1.00)
GFR, Estimated: >60 mL/min (ref >60)
Glucose, Bld: 124 mg/dL — ABNORMAL HIGH (ref 70–99)
Potassium: 4.6 mmol/L (ref 3.5–5.1)
Sodium: 134 mmol/L — ABNORMAL LOW (ref 135–145)
Total Bilirubin: 1.7 mg/dL — ABNORMAL HIGH (ref 0.3–1.2)
Total Protein: 7.8 g/dL (ref 6.5–8.1)

## 2021-05-15 LAB — CBC WITH DIFFERENTIAL/PLATELET
Abs Immature Granulocytes: 0.05 10*3/uL (ref 0.00–0.07)
Basophils Absolute: 0 10*3/uL (ref 0.0–0.1)
Basophils Relative: 0 %
Eosinophils Absolute: 0.2 10*3/uL (ref 0.0–0.5)
Eosinophils Relative: 3 %
HCT: 37.1 % (ref 36.0–46.0)
Hemoglobin: 12.7 g/dL (ref 12.0–15.0)
Immature Granulocytes: 1 %
Lymphocytes Relative: 7 %
Lymphs Abs: 0.5 10*3/uL — ABNORMAL LOW (ref 0.7–4.0)
MCH: 37.4 pg — ABNORMAL HIGH (ref 26.0–34.0)
MCHC: 34.2 g/dL (ref 30.0–36.0)
MCV: 109.1 fL — ABNORMAL HIGH (ref 80.0–100.0)
Monocytes Absolute: 0.8 10*3/uL (ref 0.1–1.0)
Monocytes Relative: 13 %
Neutro Abs: 5.1 10*3/uL (ref 1.7–7.7)
Neutrophils Relative %: 76 %
Platelets: 234 10*3/uL (ref 150–400)
RBC: 3.4 MIL/uL — ABNORMAL LOW (ref 3.87–5.11)
RDW: 12.8 % (ref 11.5–15.5)
WBC: 6.7 10*3/uL (ref 4.0–10.5)
nRBC: 0 % (ref 0.0–0.2)

## 2021-05-15 MED ORDER — SENNOSIDES-DOCUSATE SODIUM 8.6-50 MG PO TABS: 1.0000 | ORAL_TABLET | Freq: Every evening | ORAL | Status: DC | PRN

## 2021-05-15 MED ORDER — ONDANSETRON HCL 4 MG PO TABS: 4.0000 mg | ORAL_TABLET | Freq: Four times a day (QID) | ORAL | Status: DC | PRN

## 2021-05-15 MED ORDER — ENOXAPARIN SODIUM 40 MG/0.4ML IJ SOSY
40.0000 mg | PREFILLED_SYRINGE | INTRAMUSCULAR | Status: DC
  Administered 2021-05-15 – 2021-05-21 (×7): 40 mg via SUBCUTANEOUS
  Filled 2021-05-15 (×7): qty 0.4

## 2021-05-15 MED ORDER — ACETAMINOPHEN 325 MG PO TABS
650.0000 mg | ORAL_TABLET | Freq: Four times a day (QID) | ORAL | Status: DC | PRN
  Administered 2021-05-16 – 2021-05-21 (×5): 650 mg via ORAL
  Filled 2021-05-15 (×5): qty 2

## 2021-05-15 MED ORDER — HYDROCODONE-ACETAMINOPHEN 5-325 MG PO TABS
1.0000 | ORAL_TABLET | ORAL | Status: DC | PRN
  Administered 2021-05-15 – 2021-05-20 (×15): 2 via ORAL
  Administered 2021-05-20: 1 via ORAL
  Administered 2021-05-20 – 2021-05-21 (×2): 2 via ORAL
  Administered 2021-05-21: 1 via ORAL
  Administered 2021-05-21 (×2): 2 via ORAL
  Filled 2021-05-15 (×15): qty 2
  Filled 2021-05-15: qty 1
  Filled 2021-05-15 (×5): qty 2

## 2021-05-15 MED ORDER — ACETAMINOPHEN 650 MG RE SUPP: 650.0000 mg | Freq: Four times a day (QID) | RECTAL | Status: DC | PRN

## 2021-05-15 MED ORDER — TRAZODONE HCL 50 MG PO TABS
150.0000 mg | ORAL_TABLET | Freq: Every evening | ORAL | Status: DC | PRN
  Administered 2021-05-15 – 2021-05-18 (×4): 150 mg via ORAL
  Filled 2021-05-15 (×4): qty 1

## 2021-05-15 MED ORDER — MORPHINE SULFATE (PF) 2 MG/ML IV SOLN
2.0000 mg | INTRAVENOUS | Status: DC | PRN
  Administered 2021-05-15 – 2021-05-19 (×15): 2 mg via INTRAVENOUS
  Filled 2021-05-15 (×16): qty 1

## 2021-05-15 MED ORDER — ONDANSETRON HCL 4 MG/2ML IJ SOLN: 4.0000 mg | Freq: Four times a day (QID) | INTRAMUSCULAR | Status: DC | PRN

## 2021-05-15 MED ORDER — MORPHINE SULFATE (PF) 2 MG/ML IV SOLN
2.0000 mg | Freq: Once | INTRAVENOUS | Status: AC
  Administered 2021-05-15: 2 mg via INTRAVENOUS
  Filled 2021-05-15: qty 1

## 2021-05-15 NOTE — H&P (Signed)
?History and Physical  ? ? ?Jasmine Benson PPJ:[id number] DOB: 1945-07-21 DOA: 05/15/2021 ? ?PCP: [facility] [place]  ? ?Patient coming from: Home ? ?I have personally briefly reviewed patient's old medical records in [facility] ? ?Chief Complaint: Fall and left hip and rib pain ? ?HPI: Jasmine Benson is a 76 y.o. female with medical history significant of history of hip fracture presented with fall that happened yesterday evening.  Patient apparently tripped in her kitchen and fell and subsequently started having pain in her left ribs and hip, constant, severe, aggravated by slight movement and relieved by nothing.  Patient states that this is the worst pain of her life. ?She denies any dizziness, loss of consciousness, seizures, abdominal pain, nausea, vomiting, fever, cough, shortness of breath, diarrhea or dysuria. ? ?ED Course: She was found to have left 10th rib fracture and acute left superior and inferior pubic rami fractures.  She could hardly get up or ambulate due to severe pain.  Orthopedics/Dr. Dion Saucier was consulted who recommended conservative management and he would see the patient in consultation. ?Hospitalist service was called to evaluate the patient. ? ?Review of Systems: As per HPI otherwise all other systems were reviewed and are negative. ? ? ?Past Medical History:  ?Diagnosis Date  ? Arthritis   ? 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 ? ?Past Surgical History:  ?Procedure Laterality Date  ? APPENDECTOMY    ? AUGMENTATION MAMMAPLASTY Bilateral   ? CATARACT EXTRACTION W/ INTRAOCULAR LENS IMPLANT Right 07/13/2016  ? FRACTURE SURGERY    ? HIP ARTHROPLASTY Right 10/03/2016  ? Procedure: ARTHROPLASTY BIPOLAR HIP (HEMIARTHROPLASTY);  Surgeon: Teryl Lucy, MD;  Location: [facility] OR;  Service: Orthopedics;  Laterality: Right;  ? KNEE ARTHROSCOPY Left   ? "torn meniscus"  ? SYMPATHECTOMY Left 1966  ? "lumbar; opened me up"  ? TONSILLECTOMY     ? TUBAL LIGATION    ? VAGINAL HYSTERECTOMY    ? ? ? reports that she has never smoked. She has never used smokeless tobacco. She reports current alcohol use of about 14.0 standard drinks per week. She reports that she does not use drugs. ? ?No Known Allergies ? ?Family History  ?Problem Relation Age of Onset  ? Hypertension Mother   ? Hypertension Father   ? ? ?Prior to Admission medications   ?Medication Sig Start Date End Date Taking? Authorizing Provider  ?aspirin 325 MG EC tablet Take 325 mg by mouth daily.    [provider]  ?docusate sodium (COLACE) 100 MG capsule Take 100 mg by mouth daily.    [provider]  ?enoxaparin (LOVENOX) 40 MG/0.4ML injection Inject 0.4 mLs (40 mg total) into the skin daily. 10/03/16   Teryl Lucy, MD  ?ferrous sulfate 325 (65 FE) MG tablet Take 1 tablet (325 mg total) by mouth 2 (two) times daily with a meal. 10/07/16 11/06/16  Arrien, York Ram, MD  ?gabapentin (NEURONTIN) 300 MG capsule Take 1 capsule (300 mg total) by mouth at bedtime. 11/15/16   Linwood Dibbles, MD  ?ondansetron (ZOFRAN) 4 MG tablet Take 1 tablet (4 mg total) by mouth every 8 (eight) hours as needed for nausea or vomiting. 10/03/16   Teryl Lucy, MD  ?oxyCODONE-acetaminophen (PERCOCET) 10-325 MG tablet Take 1-2 tablets by mouth every 6 (six) hours as needed for pain. MAXIMUM TOTAL ACETAMINOPHEN DOSE IS 4000 MG PER DAY 10/08/16   Teryl Lucy, MD  ?sennosides-docusate sodium (SENOKOT-S) 8.6-50  MG tablet Take 2 tablets by mouth daily. 10/03/16   Teryl Lucy, MD  ? ? ?Physical Exam: ?Vitals:  ? 05/15/21 1500 05/15/21 1515 05/15/21 1530 05/15/21 1657  ?BP: 131/74 123/78 127/76 116/78  ?Pulse: (!) 110 (!) 103 (!) 107 (!) 114  ?Resp: (!) 23 (!) 22 18 16   ?Temp:      ?TempSrc:      ?SpO2: 92% 91% 91% 94%  ?Weight:      ?Height:      ? ? ?Constitutional: NAD, calm, comfortable ?Vitals:  ? 05/15/21 1500 05/15/21 1515 05/15/21 1530 05/15/21 1657  ?BP: 131/74 123/78 127/76 116/78  ?Pulse: (!) 110  (!) 103 (!) 107 (!) 114  ?Resp: (!) 23 (!) 22 18 16   ?Temp:      ?TempSrc:      ?SpO2: 92% 91% 91% 94%  ?Weight:      ?Height:      ? ?Eyes: PERRL, lids and conjunctivae normal ?ENMT: Mucous membranes are moist. Posterior pharynx clear of any exudate or lesions. ?Neck: normal, supple, no masses, no thyromegaly ?Respiratory: bilateral decreased breath sounds at bases, no wheezing, no crackles.  Intermittently tachypneic.  No accessory muscle use.  ?Cardiovascular: S1 S2 positive, currently tachycardic. No extremity edema. 2+ pedal pulses.  ?Abdomen: no tenderness, no masses palpated. No hepatosplenomegaly. Bowel sounds positive.  ?Musculoskeletal: no clubbing / cyanosis.  Left hip tenderness present  ?skin: no rashes, lesions, ulcers. No induration ?Neurologic: CN 2-12 grossly intact. Moving extremities. No focal neurologic deficits.  ?Psychiatric: Normal judgment and insight. Alert and oriented x 3. Normal mood.  ? ? ?Labs on Admission: I have personally reviewed following labs and imaging studies ? ?CBC: ?Recent Labs  ?Lab 05/15/21 ?1237  ?WBC 6.7  ?NEUTROABS 5.1  ?HGB 12.7  ?HCT 37.1  ?MCV 109.1*  ?PLT 234  ? ?Basic Metabolic Panel: ?Recent Labs  ?Lab 05/15/21 ?1237  ?NA 134*  ?K 4.6  ?CL 101  ?CO2 25  ?GLUCOSE 124*  ?BUN 18  ?CREATININE 0.72  ?CALCIUM 9.2  ? ?GFR: ?Estimated Creatinine Clearance: 54.7 mL/min (by C-G formula based on SCr of 0.72 mg/dL). ?Liver Function Tests: ?Recent Labs  ?Lab 05/15/21 ?1237  ?AST 51*  ?ALT 37  ?ALKPHOS 57  ?BILITOT 1.7*  ?PROT 7.8  ?ALBUMIN 4.0  ? ?No results for input(s): LIPASE, AMYLASE in the last 168 hours. ?No results for input(s): AMMONIA in the last 168 hours. ?Coagulation Profile: ?No results for input(s): INR, PROTIME in the last 168 hours. ?Cardiac Enzymes: ?No results for input(s): CKTOTAL, CKMB, CKMBINDEX, TROPONINI in the last 168 hours. ?BNP (last 3 results) ?No results for input(s): PROBNP in the last 8760 hours. ?HbA1C: ?No results for input(s): HGBA1C in the  last 72 hours. ?CBG: ?No results for input(s): GLUCAP in the last 168 hours. ?Lipid Profile: ?No results for input(s): CHOL, HDL, LDLCALC, TRIG, CHOLHDL, LDLDIRECT in the last 72 hours. ?Thyroid Function Tests: ?No results for input(s): TSH, T4TOTAL, FREET4, T3FREE, THYROIDAB in the last 72 hours. ?Anemia Panel: ?No results for input(s): VITAMINB12, FOLATE, FERRITIN, TIBC, IRON, RETICCTPCT in the last 72 hours. ?Urine analysis: ?   ?Component Value Date/Time  ? COLORURINE YELLOW 11/15/2016 1410  ? APPEARANCEUR CLEAR 11/15/2016 1410  ? LABSPEC 1.011 11/15/2016 1410  ? PHURINE 5.0 11/15/2016 1410  ? GLUCOSEU NEGATIVE 11/15/2016 1410  ? HGBUR NEGATIVE 11/15/2016 1410  ? BILIRUBINUR NEGATIVE 11/15/2016 1410  ? KETONESUR 5 (A) 11/15/2016 1410  ? PROTEINUR NEGATIVE 11/15/2016 1410  ? NITRITE NEGATIVE 11/15/2016 1410  ?  LEUKOCYTESUR SMALL (A) 11/15/2016 1410  ? ? ?Radiological Exams on Admission: ?DG Ribs Unilateral W/Chest Left ? ?Result Date: 05/15/2021 ?CLINICAL DATA:  Fall.  Left lower anterior rib pain. EXAM: LEFT RIBS AND CHEST - 3+ VIEW COMPARISON:  Frontal chest radiographs 11/15/2016 and 05/07/2020 FINDINGS: Cardiac silhouette and mediastinal contours are within normal limits. The lungs are clear. No pleural effusion or pneumothorax. Lateral partially calcified breast implants are again noted. A marker is seen lateral to the approximate seventh through ninth ribs. There is linear lucency and minimal cortical step-off at the posterolateral aspect of the left tenth rib, an acute to subacute minimally displaced fracture. This is seen on the second and third images of this study. Severe left-greater-than-right glenohumeral joint space narrowing. IMPRESSION:: IMPRESSION: 1. No acute lung process. 2. Minimally displaced acute to subacute fracture of the posterolateral aspect of the left tenth rib. No pneumothorax. Electronically Signed   By: Neita Garnetonald  Viola M.D.   On: 05/15/2021 12:34  ? ?CT HIP LEFT WO CONTRAST ? ?Result  Date: 05/15/2021 ?CLINICAL DATA:  Left leg pain after fall last night. EXAM: CT OF THE LEFT HIP WITHOUT CONTRAST TECHNIQUE: Multidetector CT imaging of the left hip was performed according to the standard

## 2021-05-15 NOTE — ED Provider Notes (Signed)
?[facility]-EMERGENCY DEPT ?Provider Note ? ? ?CSN: [id number] ?Arrival date & time: 05/15/21  1100 ? ?  ? ?History ? ?Chief Complaint  ?Patient presents with  ? Fall  ? ? ?Jasmine Benson is a 76 y.o. female. ? ?Pt reports she fell last pm.  Pt complains of pain in her left ribs and her left hip.   ? ?The history is provided by the patient. No language interpreter was used.  ?Fall ?This is a new problem. The current episode started yesterday. The problem occurs constantly. The problem has not changed since onset.Nothing aggravates the symptoms. Nothing relieves the symptoms. She has tried nothing for the symptoms. The treatment provided moderate relief.  ? ?  ? ?Home Medications ?Prior to Admission medications   ?Medication Sig Start Date End Date Taking? Authorizing Provider  ?aspirin 325 MG EC tablet Take 325 mg by mouth daily.    [provider]  ?docusate sodium (COLACE) 100 MG capsule Take 100 mg by mouth daily.    [provider]  ?enoxaparin (LOVENOX) 40 MG/0.4ML injection Inject 0.4 mLs (40 mg total) into the skin daily. 10/03/16   Teryl Lucy, MD  ?ferrous sulfate 325 (65 FE) MG tablet Take 1 tablet (325 mg total) by mouth 2 (two) times daily with a meal. 10/07/16 11/06/16  Arrien, York Ram, MD  ?gabapentin (NEURONTIN) 300 MG capsule Take 1 capsule (300 mg total) by mouth at bedtime. 11/15/16   Linwood Dibbles, MD  ?ondansetron (ZOFRAN) 4 MG tablet Take 1 tablet (4 mg total) by mouth every 8 (eight) hours as needed for nausea or vomiting. 10/03/16   Teryl Lucy, MD  ?oxyCODONE-acetaminophen (PERCOCET) 10-325 MG tablet Take 1-2 tablets by mouth every 6 (six) hours as needed for pain. MAXIMUM TOTAL ACETAMINOPHEN DOSE IS 4000 MG PER DAY 10/08/16   Teryl Lucy, MD  ?sennosides-docusate sodium (SENOKOT-S) 8.6-50 MG tablet Take 2 tablets by mouth daily. 10/03/16   Teryl Lucy, MD  ?   ? ?Allergies    ?Patient has no known allergies.   ? ?Review of Systems   ?Review  of Systems  ?All other systems reviewed and are negative. ? ?Physical Exam ?Updated Vital Signs ?BP (!) 130/91   Pulse (!) 105   Temp 97.9 ?F (36.6 ?C) (Oral)   Resp 19   Ht 5\' 5"  (1.651 m)   Wt 63.5 kg   LMP  (LMP Unknown)   SpO2 98%   BMI 23.30 kg/m?  ?Physical Exam ?Vitals reviewed.  ?Constitutional:   ?   Appearance: Normal appearance.  ?HENT:  ?   Mouth/Throat:  ?   Mouth: Mucous membranes are moist.  ?Eyes:  ?   Extraocular Movements: Extraocular movements intact.  ?   Pupils: Pupils are equal, round, and reactive to light.  ?Cardiovascular:  ?   Rate and Rhythm: Normal rate and regular rhythm.  ?   Pulses: Normal pulses.  ?Pulmonary:  ?   Effort: Pulmonary effort is normal.  ?Abdominal:  ?   General: Abdomen is flat.  ?Musculoskeletal:     ?   General: Swelling and tenderness present.  ?   Cervical back: Normal range of motion.  ?   Comments: Tender left ribs and left hip,  pain with movement if left hip,    ?Skin: ?   General: Skin is warm.  ?Neurological:  ?   General: No focal deficit present.  ?   Mental Status: She is alert.  ?Psychiatric:     ?  Mood and Affect: Mood normal.  ? ? ?ED Results / Procedures / Treatments   ?Labs ?(all labs ordered are listed, but only abnormal results are displayed) ?Labs Reviewed  ?CBC WITH DIFFERENTIAL/PLATELET - Abnormal; Notable for the following components:  ?    Result Value  ? RBC 3.40 (*)   ? MCV 109.1 (*)   ? MCH 37.4 (*)   ? Lymphs Abs 0.5 (*)   ? All other components within normal limits  ?COMPREHENSIVE METABOLIC PANEL - Abnormal; Notable for the following components:  ? Sodium 134 (*)   ? Glucose, Bld 124 (*)   ? AST 51 (*)   ? Total Bilirubin 1.7 (*)   ? All other components within normal limits  ?URINALYSIS, ROUTINE W REFLEX MICROSCOPIC  ? ? ?EKG ?None ? ?Radiology ?DG Ribs Unilateral W/Chest Left ? ?Result Date: 05/15/2021 ?CLINICAL DATA:  Fall.  Left lower anterior rib pain. EXAM: LEFT RIBS AND CHEST - 3+ VIEW COMPARISON:  Frontal chest radiographs  11/15/2016 and 05/07/2020 FINDINGS: Cardiac silhouette and mediastinal contours are within normal limits. The lungs are clear. No pleural effusion or pneumothorax. Lateral partially calcified breast implants are again noted. A marker is seen lateral to the approximate seventh through ninth ribs. There is linear lucency and minimal cortical step-off at the posterolateral aspect of the left tenth rib, an acute to subacute minimally displaced fracture. This is seen on the second and third images of this study. Severe left-greater-than-right glenohumeral joint space narrowing. IMPRESSION:: IMPRESSION: 1. No acute lung process. 2. Minimally displaced acute to subacute fracture of the posterolateral aspect of the left tenth rib. No pneumothorax. Electronically Signed   By: Neita Garnetonald  Viola M.D.   On: 05/15/2021 12:34  ? ?DG Hip Unilat W or Wo Pelvis 2-3 Views Left ? ?Result Date: 05/15/2021 ?CLINICAL DATA:  Fall. EXAM: DG HIP (WITH OR WITHOUT PELVIS) 2-3V LEFT COMPARISON:  Pelvis and bilateral hip radiographs 10/15/2016 FINDINGS: There is diffuse decreased bone mineralization. The left femoroacetabular joint space is maintained. Normal normal morphology of the left femoral neck. Minimal degenerative irregularity of the pubic symphysis articular surfaces. The bilateral sacroiliac joint spaces are maintained. Status post total right hip arthroplasty, partially visualized without evidence of hardware failure. Moderate multilevel degenerative disc changes of the visualized lumbar spine. Surgical clips again overlie the left aspect of the lumbar spine. IMPRESSION:: IMPRESSION: 1. No significant arthritis within the left hip. 2. No acute fracture. 3. Status post total right hip arthroplasty. Electronically Signed   By: Neita Garnetonald  Viola M.D.   On: 05/15/2021 12:24   ? ?Procedures ?Procedures  ? ? ?Medications Ordered in ED ?Medications  ?morphine (PF) 2 MG/ML injection 2 mg (2 mg Intravenous Given 05/15/21 1240)  ? ? ?ED Course/ Medical  Decision Making/ A&P ?  ?                        ?Medical Decision Making ?Amount and/or Complexity of Data Reviewed ?Labs: ordered. ?Radiology: ordered. ? ?Risk ?Prescription drug management. ? ? ?MDM:  Left rib series shows left 10th rib fracture.  Pt unable to stand.  Pt can not tolerate weight bearing left hip  Ct hip ordered  ?Pt's care turned over to oncoming provider  ? ? ? ? ? ? ? ?Final Clinical Impression(s) / ED Diagnoses ?Final diagnoses:  ?Fall, initial encounter  ?Closed fracture of one rib of left side, initial encounter  ? ? ?Rx / DC Orders ?ED Discharge Orders   ? ?  None  ? ?  ? ? ?  ?Elson Areas, [place] ?05/15/21 1510 ? ?  ?Bethann Berkshire, MD ?05/18/21 1132 ? ?

## 2021-05-15 NOTE — ED Provider Notes (Signed)
Care assumed from Alyse Low, [place].  ? ?See her note for full H&P.  ? ?Per her note, "   ?Pt reports she fell last pm.  Pt complains of pain in her left ribs and her left hip.   ?  ?The history is provided by the patient. No language interpreter was used.  ?Fall ?This is a new problem. The current episode started yesterday. The problem occurs constantly. The problem has not changed since onset.Nothing aggravates the symptoms. Nothing relieves the symptoms. She has tried nothing for the symptoms. The treatment provided moderate relief. "  ? ? ?Physical Exam  ?BP 116/78   Pulse (!) 114   Temp 97.9 ?F (36.6 ?C) (Oral)   Resp 16   Ht 5\' 5"  (1.651 m)   Wt 63.5 kg   LMP  (LMP Unknown)   SpO2 94%   BMI 23.30 kg/m?  ? ?Physical Exam ?Vitals and nursing note reviewed.  ?Constitutional:   ?   General: She is not in acute distress. ?   Appearance: She is well-developed.  ?HENT:  ?   Head: Normocephalic and atraumatic.  ?Eyes:  ?   Conjunctiva/sclera: Conjunctivae normal.  ?Cardiovascular:  ?   Rate and Rhythm: Tachycardia present.  ?Pulmonary:  ?   Effort: Pulmonary effort is normal.  ?Musculoskeletal:     ?   General: Normal range of motion.  ?   Cervical back: Neck supple.  ?Skin: ?   General: Skin is warm and dry.  ?Neurological:  ?   Mental Status: She is alert.  ? ? ? ?Procedures  ?Procedures ?Results for orders placed or performed during the hospital encounter of 05/15/21  ?CBC with Differential/Platelet  ?Result Value Ref Range  ? WBC 6.7 4.0 - 10.5 K/uL  ? RBC 3.40 (L) 3.87 - 5.11 MIL/uL  ? Hemoglobin 12.7 12.0 - 15.0 g/dL  ? HCT 37.1 36.0 - 46.0 %  ? MCV 109.1 (H) 80.0 - 100.0 fL  ? MCH 37.4 (H) 26.0 - 34.0 pg  ? MCHC 34.2 30.0 - 36.0 g/dL  ? RDW 12.8 11.5 - 15.5 %  ? Platelets 234 150 - 400 K/uL  ? nRBC 0.0 0.0 - 0.2 %  ? Neutrophils Relative % 76 %  ? Neutro Abs 5.1 1.7 - 7.7 K/uL  ? Lymphocytes Relative 7 %  ? Lymphs Abs 0.5 (L) 0.7 - 4.0 K/uL  ? Monocytes Relative 13 %  ? Monocytes Absolute 0.8 0.1 - 1.0  K/uL  ? Eosinophils Relative 3 %  ? Eosinophils Absolute 0.2 0.0 - 0.5 K/uL  ? Basophils Relative 0 %  ? Basophils Absolute 0.0 0.0 - 0.1 K/uL  ? Immature Granulocytes 1 %  ? Abs Immature Granulocytes 0.05 0.00 - 0.07 K/uL  ?Comprehensive metabolic panel  ?Result Value Ref Range  ? Sodium 134 (L) 135 - 145 mmol/L  ? Potassium 4.6 3.5 - 5.1 mmol/L  ? Chloride 101 98 - 111 mmol/L  ? CO2 25 22 - 32 mmol/L  ? Glucose, Bld 124 (H) 70 - 99 mg/dL  ? BUN 18 8 - 23 mg/dL  ? Creatinine, Ser 0.72 0.44 - 1.00 mg/dL  ? Calcium 9.2 8.9 - 10.3 mg/dL  ? Total Protein 7.8 6.5 - 8.1 g/dL  ? Albumin 4.0 3.5 - 5.0 g/dL  ? AST 51 (H) 15 - 41 U/L  ? ALT 37 0 - 44 U/L  ? Alkaline Phosphatase 57 38 - 126 U/L  ? Total Bilirubin 1.7 (H) 0.3 -  1.2 mg/dL  ? GFR, Estimated >60 >60 mL/min  ? Anion gap 8 5 - 15  ? ?DG Ribs Unilateral W/Chest Left ? ?Result Date: 05/15/2021 ?CLINICAL DATA:  Fall.  Left lower anterior rib pain. EXAM: LEFT RIBS AND CHEST - 3+ VIEW COMPARISON:  Frontal chest radiographs 11/15/2016 and 05/07/2020 FINDINGS: Cardiac silhouette and mediastinal contours are within normal limits. The lungs are clear. No pleural effusion or pneumothorax. Lateral partially calcified breast implants are again noted. A marker is seen lateral to the approximate seventh through ninth ribs. There is linear lucency and minimal cortical step-off at the posterolateral aspect of the left tenth rib, an acute to subacute minimally displaced fracture. This is seen on the second and third images of this study. Severe left-greater-than-right glenohumeral joint space narrowing. IMPRESSION:: IMPRESSION: 1. No acute lung process. 2. Minimally displaced acute to subacute fracture of the posterolateral aspect of the left tenth rib. No pneumothorax. Electronically Signed   By: Yvonne Kendall M.D.   On: 05/15/2021 12:34  ? ?CT HIP LEFT WO CONTRAST ? ?Result Date: 05/15/2021 ?CLINICAL DATA:  Left leg pain after fall last night. EXAM: CT OF THE LEFT HIP WITHOUT  CONTRAST TECHNIQUE: Multidetector CT imaging of the left hip was performed according to the standard protocol. Multiplanar CT image reconstructions were also generated. RADIATION DOSE REDUCTION: This exam was performed according to the departmental dose-optimization program which includes automated exposure control, adjustment of the mA and/or kV according to patient size and/or use of iterative reconstruction technique. COMPARISON:  Left hip x-rays from same day. FINDINGS: Bones/Joint/Cartilage Acute minimally displaced fracture of the left puboacetabular junction (series 3, image 26) and left parasymphyseal superior pubic ramus (series 7, image 25). Acute nondisplaced fracture of the left inferior pubic ramus (series 3, image 50). No dislocation. The left hip joint space is relatively preserved. Mild degenerative changes of the pubic symphysis. No joint effusion. Ligaments Ligaments are suboptimally evaluated by CT. Muscles and Tendons Grossly intact. Soft tissue No fluid collection or hematoma.  No soft tissue mass. IMPRESSION: 1. Acute fractures of the left superior and inferior pubic rami. Electronically Signed   By: Titus Dubin M.D.   On: 05/15/2021 16:12  ? ?DG Hip Unilat W or Wo Pelvis 2-3 Views Left ? ?Result Date: 05/15/2021 ?CLINICAL DATA:  Fall. EXAM: DG HIP (WITH OR WITHOUT PELVIS) 2-3V LEFT COMPARISON:  Pelvis and bilateral hip radiographs 10/15/2016 FINDINGS: There is diffuse decreased bone mineralization. The left femoroacetabular joint space is maintained. Normal normal morphology of the left femoral neck. Minimal degenerative irregularity of the pubic symphysis articular surfaces. The bilateral sacroiliac joint spaces are maintained. Status post total right hip arthroplasty, partially visualized without evidence of hardware failure. Moderate multilevel degenerative disc changes of the visualized lumbar spine. Surgical clips again overlie the left aspect of the lumbar spine. IMPRESSION::  IMPRESSION: 1. No significant arthritis within the left hip. 2. No acute fracture. 3. Status post total right hip arthroplasty. Electronically Signed   By: Yvonne Kendall M.D.   On: 05/15/2021 12:24   ? ? ?ED Course / MDM  ?  ?Medical Decision Making ?Amount and/or Complexity of Data Reviewed ?Labs: ordered. ?Radiology: ordered. ? ?Risk ?Prescription drug management. ? ? ?76 y/o female presents for eval of mechanical fall. C/o hip pain. At signout pt pending CT pelvis ? ?CT pelvis demonstrates 1. Acute fractures of the left superior and inferior pubic rami ? ?Pt unable to ambulate or even tolerate sitting up in the bed.  Will  admit for pain control.  ? ?5:09 PM CONSULT with Dr. Mardelle Matte who recommends admission. Ortho will follow  ? ?5:16 PM CONSULT with Dr. Bonney Roussel who accepts patient for admission  ? ? ? ? ? ?  ?Rodney Booze, PA-C ?05/15/21 1717 ? ?  ?Dorie Rank, MD ?05/16/21 [phone] ? ?

## 2021-05-15 NOTE — Progress Notes (Signed)
Spoke with EDP.  Pubic ramus fracture, ok to WBAT.  Admit for mobilization and pain control.  Full consult to follow.  ? ?Eulas Post, MD ? ?

## 2021-05-15 NOTE — ED Triage Notes (Signed)
Pt bib [facility] from home s/p fall last night.  Pt reports slipping on kitchen floor, has been on the floor since 2000 last night. Pt c/o left leg pain, right leg pain and left ribcage pain.  Pt reports pain more intense in left vs right leg.   ?

## 2021-05-15 NOTE — ED Notes (Signed)
Attempted to ambulate the pt. Pt says she is no shape to walk at this time will try again later. ?

## 2021-05-16 ENCOUNTER — Observation Stay (HOSPITAL_COMMUNITY): Payer: Medicare HMO

## 2021-05-16 DIAGNOSIS — R52 Pain, unspecified: Secondary | ICD-10-CM | POA: Diagnosis not present

## 2021-05-16 DIAGNOSIS — D649 Anemia, unspecified: Secondary | ICD-10-CM

## 2021-05-16 DIAGNOSIS — M7989 Other specified soft tissue disorders: Secondary | ICD-10-CM | POA: Diagnosis not present

## 2021-05-16 DIAGNOSIS — S32592A Other specified fracture of left pubis, initial encounter for closed fracture: Secondary | ICD-10-CM | POA: Diagnosis not present

## 2021-05-16 DIAGNOSIS — E876 Hypokalemia: Secondary | ICD-10-CM

## 2021-05-16 DIAGNOSIS — E871 Hypo-osmolality and hyponatremia: Secondary | ICD-10-CM | POA: Diagnosis not present

## 2021-05-16 DIAGNOSIS — R7989 Other specified abnormal findings of blood chemistry: Secondary | ICD-10-CM | POA: Diagnosis not present

## 2021-05-16 LAB — BASIC METABOLIC PANEL
Anion gap: 7 (ref 5–15)
BUN: 13 mg/dL (ref 8–23)
CO2: 24 mmol/L (ref 22–32)
Calcium: 8.7 mg/dL — ABNORMAL LOW (ref 8.9–10.3)
Chloride: 103 mmol/L (ref 98–111)
Creatinine, Ser: 0.64 mg/dL (ref 0.44–1.00)
GFR, Estimated: >60 mL/min (ref >60)
Glucose, Bld: 97 mg/dL (ref 70–99)
Potassium: 3.2 mmol/L — ABNORMAL LOW (ref 3.5–5.1)
Sodium: 134 mmol/L — ABNORMAL LOW (ref 135–145)

## 2021-05-16 LAB — CBC WITH DIFFERENTIAL/PLATELET
Abs Immature Granulocytes: 0.04 10*3/uL (ref 0.00–0.07)
Basophils Absolute: 0.1 10*3/uL (ref 0.0–0.1)
Basophils Relative: 1 %
Eosinophils Absolute: 0 10*3/uL (ref 0.0–0.5)
Eosinophils Relative: 1 %
HCT: 34.5 % — ABNORMAL LOW (ref 36.0–46.0)
Hemoglobin: 11.4 g/dL — ABNORMAL LOW (ref 12.0–15.0)
Immature Granulocytes: 1 %
Lymphocytes Relative: 15 %
Lymphs Abs: 0.8 10*3/uL (ref 0.7–4.0)
MCH: 37.1 pg — ABNORMAL HIGH (ref 26.0–34.0)
MCHC: 33 g/dL (ref 30.0–36.0)
MCV: 112.4 fL — ABNORMAL HIGH (ref 80.0–100.0)
Monocytes Absolute: 0.8 10*3/uL (ref 0.1–1.0)
Monocytes Relative: 14 %
Neutro Abs: 3.8 10*3/uL (ref 1.7–7.7)
Neutrophils Relative %: 68 %
Platelets: 197 10*3/uL (ref 150–400)
RBC: 3.07 MIL/uL — ABNORMAL LOW (ref 3.87–5.11)
RDW: 13.3 % (ref 11.5–15.5)
WBC: 5.5 10*3/uL (ref 4.0–10.5)
nRBC: 0 % (ref 0.0–0.2)

## 2021-05-16 LAB — MAGNESIUM: Magnesium: 1.9 mg/dL (ref 1.7–2.4)

## 2021-05-16 NOTE — Progress Notes (Signed)
?PROGRESS NOTE ? ? ? ?KYNDRA CONDRON  SEG:[id number] DOB: 1945-11-13 DOA: 05/15/2021 ?PCP: [facility] [place]  ? ?Brief Narrative:  ?76 y.o. female with medical history significant of history of hip fracture presented with fall, left hip and rib pain that happened an evening prior to presentation.  On presentation, she was found to have  left 10th rib fracture and acute left superior and inferior pubic rami fractures.  She could hardly get up or ambulate due to severe pain.  Orthopedics/Dr. Dion Saucier was consulted who recommended conservative management  ? ?Assessment & Plan: ?  ?Possible mechanical fall ?Intractable pain ?Acute left superior and inferior pubic rami fractures ?Left 10th rib fracture ?-Presented with possible mechanical fall with intractable pain and fractures as noted above.  ?-Still complains of significant pain but pain medications are helping.  PT eval.   ?-Orthopedics/Dr. Dion Saucier to evaluate the patient: Currently recommending conservative management ?  ?Hyponatremia ?-Mild.  Encourage oral intake. ? ?Hypokalemia ?-Encourage oral intake.  Replace ? ?Normocytic anemia ?- questionable cause.  Hemoglobin stable.  Outpatient follow-up  ? ?macrocytosis ?-Outpatient follow-up ?  ?Mildly elevated LFT  ?-AST mildly elevated. ?-Monitor intermittently. ?  ? ?DVT prophylaxis: Lovenox ?Code Status: Full ?Family Communication: None at bedside ?Disposition Plan: ?Status is: Observation ?The patient will require care spanning > 2 midnights and should be moved to inpatient because: Still in significant pain.  PT/orthopedics evaluation pending ? ? ? ?Consultants: Orthopedics ? ?Procedures: None ? ?Antimicrobials: None ? ? ?Subjective: ?Patient seen and examined at bedside.  Had fever earlier this morning.  Still complains of rib and left hip pain.  No nausea, vomiting, chest pain, worsening shortness of breath reported. ? ?Objective: ?Vitals:  ? 05/15/21 1908 05/15/21 2230 05/16/21 0257  05/16/21 7371  ?BP:  112/69 (!) 145/87 136/73  ?Pulse:  100 (!) 113 (!) 107  ?Resp:  20 20 20   ?Temp:  98.4 ?F (36.9 ?C) (!) 101.3 ?F (38.5 ?C) 98.9 ?F (37.2 ?C)  ?TempSrc:  Oral Oral Oral  ?SpO2:  99% 94% 95%  ?Weight:    67 kg  ?Height: 5\' 5"  (1.651 m)     ? ? ?Intake/Output Summary (Last 24 hours) at 05/16/2021 0853 ?Last data filed at 05/16/2021 0849 ?Gross per 24 hour  ?Intake 240 ml  ?Output 1300 ml  ?Net -1060 ml  ? ?Filed Weights  ? 05/15/21 1114 05/16/21 0620  ?Weight: 63.5 kg 67 kg  ? ? ?Examination: ? ?General exam: Appears calm and comfortable  ?Respiratory system: Bilateral decreased breath sounds at bases ?Cardiovascular system: S1 & S2 heard, Rate controlled ?Gastrointestinal system: Abdomen is nondistended, soft and nontender. Normal bowel sounds heard. ?Extremities: No cyanosis, clubbing, edema  ?Central nervous system: Alert and oriented. No focal neurological deficits. Moving extremities ?Skin: No rashes, lesions or ulcers ?Psychiatry: Judgement and insight appear normal. Mood & affect appropriate.  ? ? ? ?Data Reviewed: I have personally reviewed following labs and imaging studies ? ?CBC: ?Recent Labs  ?Lab 05/15/21 ?1237 05/16/21 ?0500  ?WBC 6.7 5.5  ?NEUTROABS 5.1 3.8  ?HGB 12.7 11.4*  ?HCT 37.1 34.5*  ?MCV 109.1* 112.4*  ?PLT 234 197  ? ?Basic Metabolic Panel: ?Recent Labs  ?Lab 05/15/21 ?1237 05/16/21 ?0500  ?NA 134* 134*  ?K 4.6 3.2*  ?CL 101 103  ?CO2 25 24  ?GLUCOSE 124* 97  ?BUN 18 13  ?CREATININE 0.72 0.64  ?CALCIUM 9.2 8.7*  ?MG  --  1.9  ? ?GFR: ?Estimated Creatinine Clearance: 54.7  mL/min (by C-G formula based on SCr of 0.64 mg/dL). ?Liver Function Tests: ?Recent Labs  ?Lab 05/15/21 ?1237  ?AST 51*  ?ALT 37  ?ALKPHOS 57  ?BILITOT 1.7*  ?PROT 7.8  ?ALBUMIN 4.0  ? ?No results for input(s): LIPASE, AMYLASE in the last 168 hours. ?No results for input(s): AMMONIA in the last 168 hours. ?Coagulation Profile: ?No results for input(s): INR, PROTIME in the last 168 hours. ?Cardiac Enzymes: ?No  results for input(s): CKTOTAL, CKMB, CKMBINDEX, TROPONINI in the last 168 hours. ?BNP (last 3 results) ?No results for input(s): PROBNP in the last 8760 hours. ?HbA1C: ?No results for input(s): HGBA1C in the last 72 hours. ?CBG: ?No results for input(s): GLUCAP in the last 168 hours. ?Lipid Profile: ?No results for input(s): CHOL, HDL, LDLCALC, TRIG, CHOLHDL, LDLDIRECT in the last 72 hours. ?Thyroid Function Tests: ?No results for input(s): TSH, T4TOTAL, FREET4, T3FREE, THYROIDAB in the last 72 hours. ?Anemia Panel: ?No results for input(s): VITAMINB12, FOLATE, FERRITIN, TIBC, IRON, RETICCTPCT in the last 72 hours. ?Sepsis Labs: ?No results for input(s): PROCALCITON, LATICACIDVEN in the last 168 hours. ? ?No results found for this or any previous visit (from the past 240 hour(s)).  ? ? ? ? ? ?Radiology Studies: ?DG Ribs Unilateral W/Chest Left ? ?Result Date: 05/15/2021 ?CLINICAL DATA:  Fall.  Left lower anterior rib pain. EXAM: LEFT RIBS AND CHEST - 3+ VIEW COMPARISON:  Frontal chest radiographs 11/15/2016 and 05/07/2020 FINDINGS: Cardiac silhouette and mediastinal contours are within normal limits. The lungs are clear. No pleural effusion or pneumothorax. Lateral partially calcified breast implants are again noted. A marker is seen lateral to the approximate seventh through ninth ribs. There is linear lucency and minimal cortical step-off at the posterolateral aspect of the left tenth rib, an acute to subacute minimally displaced fracture. This is seen on the second and third images of this study. Severe left-greater-than-right glenohumeral joint space narrowing. IMPRESSION:: IMPRESSION: 1. No acute lung process. 2. Minimally displaced acute to subacute fracture of the posterolateral aspect of the left tenth rib. No pneumothorax. Electronically Signed   By: Neita Garnetonald  Viola M.D.   On: 05/15/2021 12:34  ? ?CT HIP LEFT WO CONTRAST ? ?Result Date: 05/15/2021 ?CLINICAL DATA:  Left leg pain after fall last night. EXAM: CT  OF THE LEFT HIP WITHOUT CONTRAST TECHNIQUE: Multidetector CT imaging of the left hip was performed according to the standard protocol. Multiplanar CT image reconstructions were also generated. RADIATION DOSE REDUCTION: This exam was performed according to the departmental dose-optimization program which includes automated exposure control, adjustment of the mA and/or kV according to patient size and/or use of iterative reconstruction technique. COMPARISON:  Left hip x-rays from same day. FINDINGS: Bones/Joint/Cartilage Acute minimally displaced fracture of the left puboacetabular junction (series 3, image 26) and left parasymphyseal superior pubic ramus (series 7, image 25). Acute nondisplaced fracture of the left inferior pubic ramus (series 3, image 50). No dislocation. The left hip joint space is relatively preserved. Mild degenerative changes of the pubic symphysis. No joint effusion. Ligaments Ligaments are suboptimally evaluated by CT. Muscles and Tendons Grossly intact. Soft tissue No fluid collection or hematoma.  No soft tissue mass. IMPRESSION: 1. Acute fractures of the left superior and inferior pubic rami. Electronically Signed   By: Obie DredgeWilliam T Derry M.D.   On: 05/15/2021 16:12  ? ?DG Hip Unilat W or Wo Pelvis 2-3 Views Left ? ?Result Date: 05/15/2021 ?CLINICAL DATA:  Fall. EXAM: DG HIP (WITH OR WITHOUT PELVIS) 2-3V LEFT COMPARISON:  Pelvis and bilateral hip radiographs 10/15/2016 FINDINGS: There is diffuse decreased bone mineralization. The left femoroacetabular joint space is maintained. Normal normal morphology of the left femoral neck. Minimal degenerative irregularity of the pubic symphysis articular surfaces. The bilateral sacroiliac joint spaces are maintained. Status post total right hip arthroplasty, partially visualized without evidence of hardware failure. Moderate multilevel degenerative disc changes of the visualized lumbar spine. Surgical clips again overlie the left aspect of the lumbar  spine. IMPRESSION:: IMPRESSION: 1. No significant arthritis within the left hip. 2. No acute fracture. 3. Status post total right hip arthroplasty. Electronically Signed   By: Neita Garnet M.D.   On: 03/24/

## 2021-05-16 NOTE — Evaluation (Signed)
Physical Therapy Evaluation ?Patient Details ?Name: Jasmine Benson ?MRN: [id number] ?DOB: 1945/10/01 ?Today's Date: 05/16/2021 ? ?History of Present Illness ? Jasmine Benson is a 76 y.o. female with history of previous right hip fracture status post hemiarthroplasty presenting to the ED yesterday after a fall.  She fell on her left side on the evening of 05/14/2021.  She slipped on her kitchen floor.  At the time of the fall she had severe left leg and left rib cage pain.  She presented to the emergency department on 05/15/2021.  X-rays were taken as well as CT of the left hip which showed simple left superior and inferior pubic rami fractures rib fracture.. ?  ?Clinical Impression ? Patient is a 76 year old woman with above HPI. Patient reports she lives alone and is typically independent  without an assistive device and is now s/p fall with inferior and superior pubic rami fractures and a left sided rib fracture. Patient is significantly limited by pain leading to decreased activity tolerance, and impaired balance. Patient is min assist for transfers with increased time and use of a rolling walker. Patient will benefit from continued skilled PT services during hospital stay to progress toward increasing her independence and maximizing safety with mobility. Recommend short term rehab stay at discharge due to patient's current deficits and decreased caregiver support at home.   ?   ? ?Recommendations for follow up therapy are one component of a multi-disciplinary discharge planning process, led by the attending physician.  Recommendations may be updated based on patient status, additional functional criteria and insurance authorization. ? ?Follow Up Recommendations Skilled nursing-short term rehab (<3 hours/day) ? ?  ?Assistance Recommended at Discharge Frequent or constant Supervision/Assistance  ?Patient can return home with the following ? A little help with walking and/or transfers;A little help with  bathing/dressing/bathroom;Assistance with cooking/housework;Assist for transportation;Help with stairs or ramp for entrance ? ?  ?Equipment Recommendations None recommended by PT  ?Recommendations for Other Services ?    ?  ?Functional Status Assessment Patient has had a recent decline in their functional status and demonstrates the ability to make significant improvements in function in a reasonable and predictable amount of time.  ? ?  ?Precautions / Restrictions Precautions ?Precautions: Fall ?Precaution Comments: jerking movements with pain ?Restrictions ?Weight Bearing Restrictions: No ?LLE Weight Bearing: Weight bearing as tolerated  ? ?  ? ?Mobility ? Bed Mobility ?Overal bed mobility: Needs Assistance ?Bed Mobility: Supine to Sit ?  ?  ?Supine to sit: Min assist, HOB elevated ?  ?  ?General bed mobility comments: Min assist for LLE, increased time to transfer to side of bed. ?  ? ?Transfers ?Overall transfer level: Needs assistance ?Equipment used: Rolling walker (2 wheels) ?Transfers: Sit to/from Stand, Bed to chair/wheelchair/BSC ?Sit to Stand: Min guard, From elevated surface ?  ?Step pivot transfers: Min assist, +2 safety/equipment ?  ?  ?  ?General transfer comment: Min assist for steadying and sequencing for steps and RW management. Pt has jerking movements with pain during transfer requiring assist to maximize safety. Vocalizing in pain throughout. ?  ? ?Ambulation/Gait ?  ?  ?  ?  ?  ?  ?  ?  ? ?Stairs ?  ?  ?  ?  ?  ? ?Wheelchair Mobility ?  ? ?Modified Rankin (Stroke Patients Only) ?  ? ?  ? ?Balance Overall balance assessment: Needs assistance ?Sitting-balance support: No upper extremity supported ?Sitting balance-Leahy Scale: Fair ?Sitting balance - Comments: decreased  WB on L when sitting, offloading toward R side due to pain ?  ?Standing balance support: Bilateral upper extremity supported, Reliant on assistive device for balance ?Standing balance-Leahy Scale: Poor ?  ?  ?  ?  ?  ?  ?  ?  ?  ?   ?  ?  ?   ? ? ? ?Pertinent Vitals/Pain Pain Assessment ?Pain Assessment: 0-10 ?Faces Pain Scale: Hurts worst ?Pain Location: Rib and LLE with movement ?Pain Descriptors / Indicators: Grimacing, Sharp, Shooting, Guarding ?Pain Intervention(s): Limited activity within patient's tolerance, Repositioned, Monitored during session, Premedicated before session  ? ? ?Home Living Family/patient expects to be discharged to:: Private residence ?Living Arrangements: Alone ?  ?Type of Home: House ?Home Access: Level entry ?  ?  ?Alternate Level Stairs-Number of Steps: 16 ?Home Layout: Two level;1/2 bath on main level;Bed/bath upstairs ?Home Equipment: Agricultural consultantolling Walker (2 wheels);Crutches;Hand held shower head ?   ?  ?Prior Function Prior Level of Function : Independent/Modified Independent ?  ?  ?  ?  ?  ?  ?  ?  ?  ? ? ?Hand Dominance  ? Dominant Hand: Right ? ?  ?Extremity/Trunk Assessment  ? Upper Extremity Assessment ?Upper Extremity Assessment: Defer to OT evaluation ?RUE Deficits / Details: WFL ROM, limited abilty to resist due to pain from rib - appears functional ?RUE Sensation: WNL ?RUE Coordination: WNL ?LUE Deficits / Details: WFL ROM and strength ?LUE Sensation: WNL ?LUE Coordination: WNL ?  ? ?Lower Extremity Assessment ?Lower Extremity Assessment: RLE deficits/detail;LLE deficits/detail ?RLE Deficits / Details: MMT 4+/5 throughout ?LLE Deficits / Details: DF/PF strength 4/5. Able toperform AROM hip flexion, LAQ with increased pain. ?LLE: Unable to fully assess due to pain ?  ? ?Cervical / Trunk Assessment ?Cervical / Trunk Assessment: Normal  ?Communication  ? Communication: No difficulties  ?Cognition Arousal/Alertness: Awake/alert ?Behavior During [facility]: [facility] for tasks assessed/performed ?Overall Cognitive Status: Within Functional Limits for tasks assessed ?  ?  ?  ?  ?  ?  ?  ?  ?  ?  ?  ?  ?  ?  ?  ?  ?  ?  ?  ? ?  ?General Comments   ? ?  ?Exercises    ? ?Assessment/Plan  ?  ?PT Assessment Patient needs  continued PT services  ?PT Problem List Decreased strength;Decreased activity tolerance;Decreased balance;Decreased mobility;Decreased knowledge of use of DME;Pain ? ?   ?  ?PT Treatment Interventions DME instruction;Gait training;Stair training;Functional mobility training;Therapeutic activities;Therapeutic exercise;Balance training;Patient/family education   ? ?PT Goals (Current goals can be found in the Care Plan section)  ?Acute Rehab PT Goals ?Patient Stated Goal: have less pain and be able to regain independence ?PT Goal Formulation: With patient ?Time For Goal Achievement: 05/30/21 ?Potential to Achieve Goals: Good ? ?  ?Frequency Min 2X/week ?  ? ? ?Co-evaluation PT/OT/SLP Co-Evaluation/Treatment: Yes ?Reason for Co-Treatment: For patient/therapist safety;To address functional/ADL transfers ?PT goals addressed during session: Mobility/safety with mobility;Proper use of DME ?  ?  ? ? ?  ?AM-PAC PT "6 Clicks" Mobility  ?Outcome Measure Help needed turning from your back to your side while in a flat bed without using bedrails?: A Little ?Help needed moving from lying on your back to sitting on the side of a flat bed without using bedrails?: A Little ?Help needed moving to and from a bed to a chair (including a wheelchair)?: A Little ?Help needed standing up from a chair using your arms (e.g., wheelchair  or bedside chair)?: A Little ?Help needed to walk in hospital room?: A Lot ?Help needed climbing 3-5 steps with a railing? : A Lot ?6 Click Score: 16 ? ?  ?End of Session Equipment Utilized During Treatment: Gait belt ?Activity Tolerance: Patient limited by pain ?Patient left: in chair;with call bell/phone within reach ?Nurse Communication: Mobility status ?PT Visit Diagnosis: Unsteadiness on feet (R26.81);Muscle weakness (generalized) (M62.81);History of falling (Z91.81);Pain;Difficulty in walking, not elsewhere classified (R26.2) ?Pain - Right/Left: Left ?Pain - part of body: Hip ?  ? ?Time: 2440-1027 ?PT Time  Calculation (min) (ACUTE ONLY): 21 min ? ? ?Charges:   PT Evaluation ?$PT Eval Low Complexity: 1 Low ?  ?  ?   ? ? ?Lyman Speller., PT, DPT  ?Acute Rehabilitation Services  ?Office [phone] ? ? ?05/16/2021, 4:20

## 2021-05-16 NOTE — Evaluation (Signed)
Occupational Therapy Evaluation ?Patient Details ?Name: Jasmine Benson ?MRN: [id number] ?DOB: 09/30/1945 ?Today's Date: 05/16/2021 ? ? ?History of Present Illness Jasmine Benson is a 76 y.o. female with history of previous right hip fracture status post hemiarthroplasty presenting to the ED yesterday after a fall.  She fell on her left side on the evening of 05/14/2021.  She slipped on her kitchen floor.  At the time of the fall she had severe left leg and left rib cage pain.  She presented to the emergency department on 05/15/2021.  X-rays were taken as well as CT of the left hip which showed simple left superior and inferior pubic rami fractures rib fracture..  ? ?Clinical Impression ?  ?Jasmine Benson is a 76 year old woman who lives alone and is typically independent  without a device is now s/p fall with inferior and superior pubic rami fractures and a right sided rib fracture. Patient is limited by pain, decreased activity tolerance, and impaired balance resulting in a sudden decline in functional abilities. Patient is overall min assist for transfers with increased time and use of a walker and needing max assist for LB ADLs and toileting. Patient will benefit from skilled OT services while in hospital to improve deficits and learn compensatory strategies as needed in order to return to PLOF.  Recommend short term rehab at discharge due to patient's deficits and lack of support.  ?   ? ?Recommendations for follow up therapy are one component of a multi-disciplinary discharge planning process, led by the attending physician.  Recommendations may be updated based on patient status, additional functional criteria and insurance authorization.  ? ?Follow Up Recommendations ? Skilled nursing-short term rehab (<3 hours/day)  ?  ?Assistance Recommended at Discharge Frequent or constant Supervision/Assistance  ?Patient can return home with the following A little help with walking and/or transfers;A lot of  help with bathing/dressing/bathroom;Assistance with cooking/housework;Help with stairs or ramp for entrance;Assist for transportation ? ?  ?Functional Status Assessment ? Patient has had a recent decline in their functional status and demonstrates the ability to make significant improvements in function in a reasonable and predictable amount of time.  ?Equipment Recommendations ? BSC/3in1  ?  ?Recommendations for Other Services   ? ? ?  ?Precautions / Restrictions Precautions ?Precautions: Fall ?Precaution Comments: jerks with pain ?Restrictions ?Weight Bearing Restrictions: No ?RLE Weight Bearing: Weight bearing as tolerated ?LLE Weight Bearing: Weight bearing as tolerated  ? ?  ? ?Mobility Bed Mobility ?Overal bed mobility: Needs Assistance ?Bed Mobility: Supine to Sit ?  ?  ?Supine to sit: Min assist, HOB elevated ?  ?  ?General bed mobility comments: Min assist for LLE, increased time to transfer to side of bed. ?  ? ?Transfers ?Overall transfer level: Needs assistance ?Equipment used: Rolling walker (2 wheels) ?Transfers: Sit to/from Stand, Bed to chair/wheelchair/BSC ?Sit to Stand: Min guard, From elevated surface ?  ?  ?Step pivot transfers: Min assist, +2 safety/equipment ?  ?  ?General transfer comment: Min assist for steadying as well as instruction to use walker to take steps to recliner. Patient jerks in response to pain and is a high fall risk. ?  ? ?  ?Balance Overall balance assessment: Needs assistance ?Sitting-balance support: No upper extremity supported ?Sitting balance-Leahy Scale: Fair ?Sitting balance - Comments: offloads to the right to reduce pain on left ?  ?Standing balance support: Bilateral upper extremity supported, Reliant on assistive device for balance ?Standing balance-Leahy Scale: Poor ?  ?  ?  ?  ?  ?  ?  ?  ?  ?  ?  ?  ?   ? ?  ADL either performed or assessed with clinical judgement  ? ?ADL Overall ADL's : Needs assistance/impaired ?Eating/Feeding: Independent ?  ?Grooming: Set  up;Sitting ?  ?Upper Body Bathing: Set up;Sitting ?  ?Lower Body Bathing: Maximal assistance;Sit to/from stand ?  ?Upper Body Dressing : Set up;Sitting ?  ?Lower Body Dressing: Maximal assistance;Sit to/from stand ?  ?Toilet Transfer: Minimal assistance;BSC/3in1;Rolling walker (2 wheels) ?  ?Toileting- Clothing Manipulation and Hygiene: Maximal assistance;Sit to/from stand ?  ?Tub/ Shower Transfer: Minimal assistance;Shower seat ?  ?Functional mobility during ADLs: Minimal assistance;Rolling walker (2 wheels) ?   ? ? ? ?Vision Patient Visual Report: No change from baseline ?   ?   ?Perception   ?  ?Praxis   ?  ? ?Pertinent Vitals/Pain Pain Assessment ?Pain Assessment: Faces ?Faces Pain Scale: Hurts worst ?Pain Location: RIb and LLE with movement ?Pain Descriptors / Indicators: Grimacing, Sharp, Shooting, Guarding ?Pain Intervention(s): Limited activity within patient's tolerance, Premedicated before session  ? ? ? ?Hand Dominance Right ?  ?Extremity/Trunk Assessment Upper Extremity Assessment ?Upper Extremity Assessment: RUE deficits/detail;LUE deficits/detail ?RUE Deficits / Details: WFL ROM, limited abilty to resist due to pain from rib - appears functional ?RUE Sensation: WNL ?RUE Coordination: WNL ?LUE Deficits / Details: WFL ROM and strength ?LUE Sensation: WNL ?LUE Coordination: WNL ?  ?Lower Extremity Assessment ?Lower Extremity Assessment: Defer to PT evaluation ?  ?Cervical / Trunk Assessment ?Cervical / Trunk Assessment: Normal ?  ?Communication Communication ?Communication: No difficulties ?  ?Cognition Arousal/Alertness: Awake/alert ?Behavior During Therapy: [facility] for tasks assessed/performed ?Overall Cognitive Status: Within Functional Limits for tasks assessed ?  ?  ?  ?  ?  ?  ?  ?  ?  ?  ?  ?  ?  ?  ?  ?  ?  ?  ?  ?General Comments    ? ?  ?Exercises   ?  ?Shoulder Instructions    ? ? ?Home Living Family/patient expects to be discharged to:: Private residence ?Living Arrangements: Alone ?  ?Type of  Home: House ?Home Access: Level entry ?  ?  ?Home Layout: Two level;1/2 bath on main level;Bed/bath upstairs ?Alternate Level Stairs-Number of Steps: 16 ?Alternate Level Stairs-Rails: Left ?Bathroom Shower/Tub: Walk-in shower ?  ?Bathroom Toilet: Standard ?  ?  ?Home Equipment: Agricultural consultantolling Walker (2 wheels);Crutches;Hand held shower head ?  ?  ?  ? ?  ?Prior Functioning/Environment Prior Level of Function : Independent/Modified Independent ?  ?  ?  ?  ?  ?  ?  ?  ?  ? ?  ?  ?OT Problem List: Decreased strength;Decreased activity tolerance;Impaired balance (sitting and/or standing);Pain;Impaired UE functional use;Decreased knowledge of precautions;Decreased knowledge of use of DME or AE ?  ?   ?OT Treatment/Interventions: Self-care/ADL training;DME and/or AE instruction;Therapeutic exercise;Patient/family education;Balance training;Therapeutic activities  ?  ?OT Goals(Current goals can be found in the care plan section) Acute Rehab OT Goals ?Patient Stated Goal: less pain ?OT Goal Formulation: With patient ?Time For Goal Achievement: 05/30/21 ?Potential to Achieve Goals: Good  ?OT Frequency: Min 2X/week ?  ? ?Co-evaluation PT/OT/SLP Co-Evaluation/Treatment: Yes (coeval) ?  ?  ?  ?  ? ?  ?AM-PAC OT "6 Clicks" Daily Activity     ?Outcome Measure Help from another person eating meals?: None ?Help from another person taking care of personal grooming?: A Little ?Help from another person toileting, which includes using toliet, bedpan, or urinal?: A Lot ?Help from another person bathing (including washing, rinsing, drying)?: A Lot ?Help from  another person to put on and taking off regular upper body clothing?: A Little ?Help from another person to put on and taking off regular lower body clothing?: A Lot ?6 Click Score: 16 ?  ?End of Session Equipment Utilized During Treatment: Rolling walker (2 wheels);Gait belt ?Nurse Communication: Mobility status ? ?Activity Tolerance: Patient limited by pain ?Patient left: in chair;with  call bell/phone within reach;with chair alarm set ? ?OT Visit Diagnosis: Pain  ?              ?Time: 1008-1030 ?OT Time Calculation (min): 22 min ?Charges:  OT General Charges ?$OT Visit: 1 Visit ?OT Evaluat

## 2021-05-16 NOTE — Consult Note (Addendum)
? ?ORTHOPAEDIC CONSULTATION ? ?REQUESTING PHYSICIAN: Glade Lloyd, MD ? ?Chief Complaint: fall ? ?HPI: ?Jasmine Benson is a 76 y.o. female with history of previous right hip fracture status post hemiarthroplasty presenting to the ED yesterday after a fall.  She fell on her left side on the evening of 05/14/2021.  She slipped on her kitchen floor.  At the time of the fall she had severe left leg and left rib cage pain.  She presented to the emergency department on 05/15/2021.  X-rays were taken as well as CT of the left hip which showed simple left superior and inferior pubic rami fractures rib fracture..  ED providers attempted to ambulate patient and she was unable to do so due to pain.  Patient was admitted for pain control and to work with physical therapy.  Today she states she continues to have severe left-sided pain at her left groin left anterior thigh. Pain is worse with movement and better with rest and pain medication.  She states pain is slightly improved from yesterday.  She has moderate pain in her right groin.  She denies any numbness tingling or burning sensation.  Denies any chest pain shortness of breath nausea vomiting or abdominal pain. ? ?Past Medical History:  ?Diagnosis Date  ? Arthritis   ? 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 ?Past Surgical History:  ?Procedure Laterality Date  ? APPENDECTOMY    ? AUGMENTATION MAMMAPLASTY Bilateral   ? CATARACT EXTRACTION W/ INTRAOCULAR LENS IMPLANT Right 07/13/2016  ? FRACTURE SURGERY    ? HIP ARTHROPLASTY Right 10/03/2016  ? Procedure: ARTHROPLASTY BIPOLAR HIP (HEMIARTHROPLASTY);  Surgeon: Teryl Lucy, MD;  Location: [facility] OR;  Service: Orthopedics;  Laterality: Right;  ? KNEE ARTHROSCOPY Left   ? "torn meniscus"  ? SYMPATHECTOMY Left 1966  ? "lumbar; opened me up"  ? TONSILLECTOMY    ? TUBAL LIGATION    ? VAGINAL HYSTERECTOMY    ? ?Social History  ? ?Socioeconomic History  ? Marital status: Divorced  ?  Spouse name: Not on  file  ? Number of children: Not on file  ? Years of education: Not on file  ? Highest education level: Not on file  ?Occupational History  ? Not on file  ?Tobacco Use  ? Smoking status: Never  ? Smokeless tobacco: Never  ?Vaping Use  ? Vaping Use: Never used  ?Substance and Sexual Activity  ? Alcohol use: Yes  ?  Alcohol/week: 14.0 standard drinks  ?  Types: 14 Glasses of wine per week  ?  Comment: 10/05/2016 "couple glasses of red wine/day"  ? Drug use: No  ? Sexual activity: Not Currently  ?Other Topics Concern  ? Not on file  ?Social History Narrative  ? Not on file  ? ?Social Determinants of Health  ? ?Financial Resource Strain: Not on file  ?Food Insecurity: Not on file  ?Transportation Needs: Not on file  ?Physical Activity: Not on file  ?Stress: Not on file  ?Social Connections: Not on file  ? ?Family History  ?Problem Relation Age of Onset  ? Hypertension Mother   ? Hypertension Father   ? ?No Known Allergies ? ? ?Positive ROS: All other systems have been reviewed and were otherwise negative with the exception of those mentioned in the HPI and as above. ? ?Physical Exam: ?General: Alert, no acute distress ?Cardiovascular: No pedal edema ?Respiratory: No cyanosis, no use of accessory musculature ?GI: No organomegaly, abdomen is soft and non-tender ?  Skin: No lesions in the area of chief complaint ?Neurologic: Sensation intact distally ?Psychiatric: Patient is competent for consent with normal mood and affect ?Lymphatic: No axillary or cervical lymphadenopathy ? ?MUSCULOSKELETAL: Dorsiflexion plantarflexion intact bilaterally.  2+ DP pulses bilaterally.  Sensation intact all aspects of bilateral feet.  While supine in bed she is able to flex her right hip approximately 80 degrees with moderate pain.  When asked to move her left hip, she states she has severe pain with any slight flexion at the left hip. ? ?Imaging:  ?X-rays of ribs show a left 10th rib fracture without pneumothorax ?X-rays of pelvis and  bilateral hip show no arthritis of left hip no acute fracture with intact hemiarthroplasty of the right hip ?CT of left hip shows acute fractures of the left superior and inferior pubic rami ? ?Assessment: ?Left superior and inferior pubic rami fractures in patient with history of right hip hemiarthroplasty ? ?Plan: ?Patient continues to be in severe pain today.  I will plan to order CT of her right hip to rule out any sort of periprosthetic fracture of the right hip if she continues to have right hip pain with movement.  We will plan to continue with pain control today and have her work with physical therapy to see if we can get her mobilized today.  She can weight-bear as tolerated on bilateral lower extremities. Lovenox for DVT prophylaxis. ? ?Armida Sans, PA-C ? ? ?05/16/2021 ?8:35 AM ?  ?

## 2021-05-17 ENCOUNTER — Observation Stay (HOSPITAL_COMMUNITY): Payer: Medicare HMO

## 2021-05-17 DIAGNOSIS — E871 Hypo-osmolality and hyponatremia: Secondary | ICD-10-CM | POA: Diagnosis present

## 2021-05-17 DIAGNOSIS — S32599A Other specified fracture of unspecified pubis, initial encounter for closed fracture: Secondary | ICD-10-CM | POA: Diagnosis present

## 2021-05-17 DIAGNOSIS — Z7982 Long term (current) use of aspirin: Secondary | ICD-10-CM | POA: Diagnosis not present

## 2021-05-17 DIAGNOSIS — S32592A Other specified fracture of left pubis, initial encounter for closed fracture: Secondary | ICD-10-CM | POA: Diagnosis present

## 2021-05-17 DIAGNOSIS — D7589 Other specified diseases of blood and blood-forming organs: Secondary | ICD-10-CM | POA: Diagnosis present

## 2021-05-17 DIAGNOSIS — R509 Fever, unspecified: Secondary | ICD-10-CM

## 2021-05-17 DIAGNOSIS — Z79899 Other long term (current) drug therapy: Secondary | ICD-10-CM | POA: Diagnosis not present

## 2021-05-17 DIAGNOSIS — R52 Pain, unspecified: Secondary | ICD-10-CM | POA: Diagnosis not present

## 2021-05-17 DIAGNOSIS — W010XXA Fall on same level from slipping, tripping and stumbling without subsequent striking against object, initial encounter: Secondary | ICD-10-CM | POA: Diagnosis present

## 2021-05-17 DIAGNOSIS — D649 Anemia, unspecified: Secondary | ICD-10-CM | POA: Diagnosis present

## 2021-05-17 DIAGNOSIS — S32129A Unspecified Zone II fracture of sacrum, initial encounter for closed fracture: Secondary | ICD-10-CM | POA: Diagnosis present

## 2021-05-17 DIAGNOSIS — Z96641 Presence of right artificial hip joint: Secondary | ICD-10-CM | POA: Diagnosis present

## 2021-05-17 DIAGNOSIS — M62838 Other muscle spasm: Secondary | ICD-10-CM | POA: Diagnosis present

## 2021-05-17 DIAGNOSIS — Z8781 Personal history of (healed) traumatic fracture: Secondary | ICD-10-CM | POA: Diagnosis not present

## 2021-05-17 DIAGNOSIS — E876 Hypokalemia: Secondary | ICD-10-CM | POA: Diagnosis present

## 2021-05-17 DIAGNOSIS — K5730 Diverticulosis of large intestine without perforation or abscess without bleeding: Secondary | ICD-10-CM | POA: Diagnosis present

## 2021-05-17 DIAGNOSIS — Z8249 Family history of ischemic heart disease and other diseases of the circulatory system: Secondary | ICD-10-CM | POA: Diagnosis not present

## 2021-05-17 DIAGNOSIS — R7401 Elevation of levels of liver transaminase levels: Secondary | ICD-10-CM | POA: Diagnosis present

## 2021-05-17 DIAGNOSIS — S3210XA Unspecified fracture of sacrum, initial encounter for closed fracture: Secondary | ICD-10-CM

## 2021-05-17 DIAGNOSIS — S2232XA Fracture of one rib, left side, initial encounter for closed fracture: Secondary | ICD-10-CM | POA: Diagnosis present

## 2021-05-17 DIAGNOSIS — Y92000 Kitchen of unspecified non-institutional (private) residence as  the place of occurrence of the external cause: Secondary | ICD-10-CM | POA: Diagnosis not present

## 2021-05-17 DIAGNOSIS — R7989 Other specified abnormal findings of blood chemistry: Secondary | ICD-10-CM | POA: Diagnosis not present

## 2021-05-17 DIAGNOSIS — Z90710 Acquired absence of both cervix and uterus: Secondary | ICD-10-CM | POA: Diagnosis not present

## 2021-05-17 LAB — CBC WITH DIFFERENTIAL/PLATELET
Abs Immature Granulocytes: 0.02 10*3/uL (ref 0.00–0.07)
Basophils Absolute: 0 10*3/uL (ref 0.0–0.1)
Basophils Relative: 1 %
Eosinophils Absolute: 0.1 10*3/uL (ref 0.0–0.5)
Eosinophils Relative: 2 %
HCT: 35.1 % — ABNORMAL LOW (ref 36.0–46.0)
Hemoglobin: 11.4 g/dL — ABNORMAL LOW (ref 12.0–15.0)
Immature Granulocytes: 0 %
Lymphocytes Relative: 24 %
Lymphs Abs: 1.2 10*3/uL (ref 0.7–4.0)
MCH: 36.3 pg — ABNORMAL HIGH (ref 26.0–34.0)
MCHC: 32.5 g/dL (ref 30.0–36.0)
MCV: 111.8 fL — ABNORMAL HIGH (ref 80.0–100.0)
Monocytes Absolute: 0.9 10*3/uL (ref 0.1–1.0)
Monocytes Relative: 18 %
Neutro Abs: 2.8 10*3/uL (ref 1.7–7.7)
Neutrophils Relative %: 55 %
Platelets: 209 10*3/uL (ref 150–400)
RBC: 3.14 MIL/uL — ABNORMAL LOW (ref 3.87–5.11)
RDW: 13.1 % (ref 11.5–15.5)
WBC: 5 10*3/uL (ref 4.0–10.5)
nRBC: 0 % (ref 0.0–0.2)

## 2021-05-17 LAB — COMPREHENSIVE METABOLIC PANEL
ALT: 21 U/L (ref 0–44)
AST: 23 U/L (ref 15–41)
Albumin: 3 g/dL — ABNORMAL LOW (ref 3.5–5.0)
Alkaline Phosphatase: 48 U/L (ref 38–126)
Anion gap: 8 (ref 5–15)
BUN: 14 mg/dL (ref 8–23)
CO2: 25 mmol/L (ref 22–32)
Calcium: 8.9 mg/dL (ref 8.9–10.3)
Chloride: 105 mmol/L (ref 98–111)
Creatinine, Ser: 0.6 mg/dL (ref 0.44–1.00)
GFR, Estimated: >60 mL/min (ref >60)
Glucose, Bld: 81 mg/dL (ref 70–99)
Potassium: 3.8 mmol/L (ref 3.5–5.1)
Sodium: 138 mmol/L (ref 135–145)
Total Bilirubin: 0.7 mg/dL (ref 0.3–1.2)
Total Protein: 6.2 g/dL — ABNORMAL LOW (ref 6.5–8.1)

## 2021-05-17 LAB — URINALYSIS, ROUTINE W REFLEX MICROSCOPIC
Glucose, UA: NEGATIVE mg/dL
Hgb urine dipstick: NEGATIVE
Ketones, ur: NEGATIVE mg/dL
Nitrite: NEGATIVE
Protein, ur: NEGATIVE mg/dL
Specific Gravity, Urine: 1.02 (ref 1.005–1.030)
pH: 5.5 (ref 5.0–8.0)

## 2021-05-17 LAB — MAGNESIUM: Magnesium: 1.9 mg/dL (ref 1.7–2.4)

## 2021-05-17 LAB — URINALYSIS, MICROSCOPIC (REFLEX)
Bacteria, UA: NONE SEEN
RBC / HPF: NONE SEEN RBC/hpf (ref 0–5)
Squamous Epithelial / HPF: NONE SEEN (ref 0–5)

## 2021-05-17 MED ORDER — METHOCARBAMOL 500 MG PO TABS
500.0000 mg | ORAL_TABLET | Freq: Three times a day (TID) | ORAL | Status: DC | PRN
  Administered 2021-05-17 – 2021-05-19 (×4): 500 mg via ORAL
  Filled 2021-05-17 (×5): qty 1

## 2021-05-17 MED ORDER — CITALOPRAM HYDROBROMIDE 20 MG PO TABS
20.0000 mg | ORAL_TABLET | Freq: Every day | ORAL | Status: DC
Start: 2021-05-17 — End: 2021-05-22
  Administered 2021-05-17 – 2021-05-21 (×5): 20 mg via ORAL
  Filled 2021-05-17 (×5): qty 1

## 2021-05-17 NOTE — TOC Initial Note (Signed)
Transition of Care (TOC) - Initial/Assessment Note  ? ? ?Patient Details  ?Name: Jasmine Benson ?MRN: [id number] ?Date of Birth: May 23, 1945 ? ?Transition of Care (TOC) CM/SW Contact:    ?Darleene Cleaver, LCSW ?Phone Number: ?05/17/2021, 5:45 PM ? ?Clinical Narrative:                 ?Patient is a 76 year old female who lives alone. Patient is alert and oriented x4, and is looking for SNF for short term rehab.  CSW to begin bed search in [facility]. ? ?Expected Discharge Plan: Skilled Nursing Facility ?Barriers to Discharge: English as a second language teacher, Continued Medical Work up ? ? ?Patient Goals and CMS Choice ?Patient states their goals for this hospitalization and ongoing recovery are:: To go to SNF for short term rehab, then return back home. ?CMS Medicare.gov Compare Post Acute Care list provided to:: Patient ?Choice offered to / list presented to : Patient ? ?Expected Discharge Plan and Services ?Expected Discharge Plan: Skilled Nursing Facility ?  ?  ?  ?  ?                ?  ?  ?  ?  ?  ?  ?  ?  ?  ?  ? ?Prior Living Arrangements/Services ?  ?Lives with:: Self ?Patient language and need for interpreter reviewed:: Yes ?Do you feel safe going back to the place where you live?: No   Patient needs short term rehab before returning back home.  ?Need for Family Participation in Patient Care: No (Comment) ?Care giver support system in place?: No (comment) ?  ?Criminal Activity/Legal Involvement Pertinent to Current Situation/Hospitalization: No - Comment as needed ? ?Activities of Daily Living ?Home Assistive Devices/Equipment: None ?ADL Screening (condition at time of admission) ?Patient's cognitive ability adequate to safely complete daily activities?: Yes ?Is the patient deaf or have difficulty hearing?: No ?Does the patient have difficulty seeing, even when wearing glasses/contacts?: No ?Does the patient have difficulty concentrating, remembering, or making decisions?: No ?Patient able to express need for  assistance with ADLs?: Yes ?Does the patient have difficulty dressing or bathing?: No ?Independently performs ADLs?: No ?Communication: Independent ?Dressing (OT): Independent ?Grooming: Needs assistance ?Is this a change from baseline?: Change from baseline, expected to last <3 days ?Feeding: Independent ?Bathing: Needs assistance ?Is this a change from baseline?: Change from baseline, expected to last >3 days ?Toileting: Needs assistance ?Is this a change from baseline?: Change from baseline, expected to last <3 days ?In/Out Bed: Needs assistance ?Is this a change from baseline?: Change from baseline, expected to last >3 days ?Walks in Home: Independent ?Does the patient have difficulty walking or climbing stairs?: Yes ?Weakness of Legs: Both ?Weakness of Arms/Hands: None ? ?Permission Sought/Granted ?Permission sought to share information with : Case Manager, Family Supports, Magazine features editor ?Permission granted to share information with : Yes, Release of Information Signed ? Share Information with NAMEBelva Bertin Sister [phone]  [phone]  Marlinda Mike [phone]  [phone] ? Permission granted to share info w AGENCY: SNF admissions ?   ?   ? ?Emotional Assessment ?Appearance:: Appears stated age ?Attitude/Demeanor/Rapport: Engaged ?Affect (typically observed): Accepting, Appropriate ?Orientation: : Oriented to Self, Oriented to Place, Oriented to  Time, Oriented to Situation ?Alcohol / Substance Use: Not Applicable ?Psych Involvement: No (comment) ? ?Admission diagnosis:  Pubic ramus fracture (HCC) [S32.599A] ?Fall, initial encounter [W19.XXXA] ?Closed fracture of one rib of left side, initial encounter [S22.32XA] ?Patient Active Problem List  ? Diagnosis Date  Noted  ? Fever 05/17/2021  ? Sacral fracture (HCC) 05/17/2021  ? Hypokalemia 05/16/2021  ? Normocytic anemia 05/16/2021  ? Pubic ramus fracture (HCC) 05/15/2021  ? Hyponatremia 05/15/2021  ? Macrocytosis 05/15/2021  ?  Rib fracture 05/15/2021  ? Intractable pain 05/15/2021  ? Elevated LFTs 05/15/2021  ? Akathisia   ? Hip fracture, right, closed, initial encounter (HCC) 10/03/2016  ? ?PCP:  [facility] [place] ?Pharmacy:   ?[id number] - [place],  - [address] ?[address] ?[place] [address] ?Phone: [phone] Fax: [phone] ? ? ? ? ?Social Determinants of Health (SDOH) Interventions ?  ? ?Readmission Risk Interventions ?   ? View : No data to display.  ?  ?  ?  ? ? ? ?

## 2021-05-17 NOTE — [place] FL2 (Signed)
?[place] MEDICAID FL2 LEVEL OF CARE SCREENING TOOL  ?  ? ?IDENTIFICATION  ?Patient Name: ?Jasmine Benson Birthdate: 1945/04/13 Sex: female Admission Date (Current Location): ?05/15/2021  ?[place] Number: ? [place] ?  Facility and Address:  ?[facility],  [address] [place], [place] [address] ?     Provider Number: ?[id number]  ?Attending Physician Name and Address:  ?Glade Lloyd, MD ? Relative Name and Phone Number:  ?Guido Sander [phone]  [phone]  Marlinda Mike [phone]  [phone] ?   ?Current Level of Care: ?Hospital Recommended Level of Care: ?Skilled Nursing Facility Prior Approval Number: ?  ? ?Date Approved/Denied: ?  PASRR Number: ?[id number] ? ?Discharge Plan: ?SNF ?  ? ?Current Diagnoses: ?Patient Active Problem List  ? Diagnosis Date Noted  ? Fever 05/17/2021  ? Sacral fracture (HCC) 05/17/2021  ? Hypokalemia 05/16/2021  ? Normocytic anemia 05/16/2021  ? Pubic ramus fracture (HCC) 05/15/2021  ? Hyponatremia 05/15/2021  ? Macrocytosis 05/15/2021  ? Rib fracture 05/15/2021  ? Intractable pain 05/15/2021  ? Elevated LFTs 05/15/2021  ? Akathisia   ? Hip fracture, right, closed, initial encounter (HCC) 10/03/2016  ? ? ?Orientation RESPIRATION BLADDER Height & Weight   ?  ?Self, Time, Situation, Place ? Normal Incontinent Weight: 151 lb 10.8 oz (68.8 kg) ?Height:  5\' 5"  (165.1 cm)  ?BEHAVIORAL SYMPTOMS/MOOD NEUROLOGICAL BOWEL NUTRITION STATUS  ?    Continent Diet (Cardiac)  ?AMBULATORY STATUS COMMUNICATION OF NEEDS Skin   ?Limited Assist Verbally Surgical wounds ?  ?  ?  ?    ?     ?     ? ? ?Personal Care Assistance Level of Assistance  ?Bathing, Feeding, Dressing Bathing Assistance: Limited assistance ?Feeding assistance: Independent ?Dressing Assistance: Limited assistance ?   ? ?Functional Limitations Info  ?Sight, Hearing, Speech Sight Info: Adequate ?Hearing Info: Adequate ?Speech Info: Adequate  ? ? ?SPECIAL CARE FACTORS FREQUENCY  ?PT (By  licensed PT), OT (By licensed OT)   ?  ?PT Frequency: Minimum 5x a week ?OT Frequency: Minimum 5x a week ?  ?  ?  ?   ? ? ?Contractures Contractures Info: Not present  ? ? ?Additional Factors Info  ?Code Status, Allergies, Psychotropic Code Status Info: Full Code ?Allergies Info: NKA ?Psychotropic Info: citalopram (CELEXA) tablet 20 mg ?  ?  ?   ? ?Current Medications (05/17/2021):  This is the current hospital active medication list ?Current Facility-Administered Medications  ?Medication Dose Route Frequency Provider Last Rate Last Admin  ? acetaminophen (TYLENOL) tablet 650 mg  650 mg Oral Q6H PRN 05/19/2021, MD   650 mg at 05/16/21 2005  ? Or  ? acetaminophen (TYLENOL) suppository 650 mg  650 mg Rectal Q6H PRN 2006, MD      ? citalopram (CELEXA) tablet 20 mg  20 mg Oral Daily Alekh, Kshitiz, MD   20 mg at 05/17/21 1125  ? enoxaparin (LOVENOX) injection 40 mg  40 mg Subcutaneous Q24H 05/19/21, MD   40 mg at 05/17/21 1725  ? HYDROcodone-acetaminophen (NORCO/VICODIN) 5-325 MG per tablet 1-2 tablet  1-2 tablet Oral Q4H PRN 05/19/21, MD   2 tablet at 05/17/21 1725  ? methocarbamol (ROBAXIN) tablet 500 mg  500 mg Oral Q8H PRN 05/19/21 K, PA-C   500 mg at 05/17/21 1125  ? morphine (PF) 2 MG/ML injection 2 mg  2 mg Intravenous Q2H PRN 05/19/21, MD   2 mg at 05/17/21 1612  ? ondansetron (ZOFRAN) tablet  4 mg  4 mg Oral Q6H PRN Glade Lloyd, MD      ? Or  ? ondansetron (ZOFRAN) injection 4 mg  4 mg Intravenous Q6H PRN Alekh, Kshitiz, MD      ? senna-docusate (Senokot-S) tablet 1 tablet  1 tablet Oral QHS PRN Glade Lloyd, MD      ? traZODone (DESYREL) tablet 150 mg  150 mg Oral QHS PRN Lewie Chamber, MD   150 mg at 05/16/21 2314  ? ? ? ?Discharge Medications: ?Please see discharge summary for a list of discharge medications. ? ?Relevant Imaging Results: ? ?Relevant Lab Results: ? ? ?Additional Information ?SSN [id number] ? ?Darleene Cleaver, LCSW ? ? ? ? ?

## 2021-05-17 NOTE — Progress Notes (Signed)
? ? ? ?Subjective: ?  ? Patient is alert, oriented. Reports no pain at rest. Severe pain with any movement at left hip. Right hip pain improved but she still gets some right sided groin pain with movement and attempts at ambulation. Denies any loss of bowel or bladder control. Voiding, no bowel movement yet, is passing gas. Has had some severe muscle spasms in left leg. Denies chest pain, SOB, Calf pain. No nausea/vomiting. No other complaints. ?  ? ?Objective:  ?PE: ?VITALS:   ?Vitals:  ? 05/16/21 1235 05/16/21 1959 05/17/21 0500 05/17/21 0506  ?BP: (!) 104/59 117/70  124/64  ?Pulse: (!) 108 100  98  ?Resp: 16 18  15   ?Temp: (!) 97.4 ?F (36.3 ?C) 99.1 ?F (37.3 ?C)  100 ?F (37.8 ?C)  ?TempSrc:  Oral  Oral  ?SpO2: 93% 96%  97%  ?Weight:   68.8 kg   ?Height:      ? ?General: sitting up in bed, in no acute distress ?MSK: Dorsiflexion plantarflexion intact bilaterally.  2+ DP pulses bilaterally.  Sensation intact all aspects of bilateral feet. Continues to have severe pain with active movement at left hip.  ? ? ?LABS ? ?Results for orders placed or performed during the hospital encounter of 05/15/21 (from the past 24 hour(s))  ?CBC with Differential/Platelet     Status: Abnormal  ? Collection Time: 05/17/21  4:50 AM  ?Result Value Ref Range  ? WBC 5.0 4.0 - 10.5 K/uL  ? RBC 3.14 (L) 3.87 - 5.11 MIL/uL  ? Hemoglobin 11.4 (L) 12.0 - 15.0 g/dL  ? HCT 35.1 (L) 36.0 - 46.0 %  ? MCV 111.8 (H) 80.0 - 100.0 fL  ? MCH 36.3 (H) 26.0 - 34.0 pg  ? MCHC 32.5 30.0 - 36.0 g/dL  ? RDW 13.1 11.5 - 15.5 %  ? Platelets 209 150 - 400 K/uL  ? nRBC 0.0 0.0 - 0.2 %  ? Neutrophils Relative % 55 %  ? Neutro Abs 2.8 1.7 - 7.7 K/uL  ? Lymphocytes Relative 24 %  ? Lymphs Abs 1.2 0.7 - 4.0 K/uL  ? Monocytes Relative 18 %  ? Monocytes Absolute 0.9 0.1 - 1.0 K/uL  ? Eosinophils Relative 2 %  ? Eosinophils Absolute 0.1 0.0 - 0.5 K/uL  ? Basophils Relative 1 %  ? Basophils Absolute 0.0 0.0 - 0.1 K/uL  ? Immature Granulocytes 0 %  ? Abs Immature  Granulocytes 0.02 0.00 - 0.07 K/uL  ?Comprehensive metabolic panel     Status: Abnormal  ? Collection Time: 05/17/21  4:50 AM  ?Result Value Ref Range  ? Sodium 138 135 - 145 mmol/L  ? Potassium 3.8 3.5 - 5.1 mmol/L  ? Chloride 105 98 - 111 mmol/L  ? CO2 25 22 - 32 mmol/L  ? Glucose, Bld 81 70 - 99 mg/dL  ? BUN 14 8 - 23 mg/dL  ? Creatinine, Ser 0.60 0.44 - 1.00 mg/dL  ? Calcium 8.9 8.9 - 10.3 mg/dL  ? Total Protein 6.2 (L) 6.5 - 8.1 g/dL  ? Albumin 3.0 (L) 3.5 - 5.0 g/dL  ? AST 23 15 - 41 U/L  ? ALT 21 0 - 44 U/L  ? Alkaline Phosphatase 48 38 - 126 U/L  ? Total Bilirubin 0.7 0.3 - 1.2 mg/dL  ? GFR, Estimated >60 >60 mL/min  ? Anion gap 8 5 - 15  ?Magnesium     Status: None  ? Collection Time: 05/17/21  4:50 AM  ?Result Value Ref  Range  ? Magnesium 1.9 1.7 - 2.4 mg/dL  ? ? ?DG Ribs Unilateral W/Chest Left ? ?Result Date: 05/15/2021 ?CLINICAL DATA:  Fall.  Left lower anterior rib pain. EXAM: LEFT RIBS AND CHEST - 3+ VIEW COMPARISON:  Frontal chest radiographs 11/15/2016 and 05/07/2020 FINDINGS: Cardiac silhouette and mediastinal contours are within normal limits. The lungs are clear. No pleural effusion or pneumothorax. Lateral partially calcified breast implants are again noted. A marker is seen lateral to the approximate seventh through ninth ribs. There is linear lucency and minimal cortical step-off at the posterolateral aspect of the left tenth rib, an acute to subacute minimally displaced fracture. This is seen on the second and third images of this study. Severe left-greater-than-right glenohumeral joint space narrowing. IMPRESSION:: IMPRESSION: 1. No acute lung process. 2. Minimally displaced acute to subacute fracture of the posterolateral aspect of the left tenth rib. No pneumothorax. Electronically Signed   By: Neita Garnetonald  Viola M.D.   On: 05/15/2021 12:34  ? ?CT HIP LEFT WO CONTRAST ? ?Result Date: 05/15/2021 ?CLINICAL DATA:  Left leg pain after fall last night. EXAM: CT OF THE LEFT HIP WITHOUT CONTRAST  TECHNIQUE: Multidetector CT imaging of the left hip was performed according to the standard protocol. Multiplanar CT image reconstructions were also generated. RADIATION DOSE REDUCTION: This exam was performed according to the departmental dose-optimization program which includes automated exposure control, adjustment of the mA and/or kV according to patient size and/or use of iterative reconstruction technique. COMPARISON:  Left hip x-rays from same day. FINDINGS: Bones/Joint/Cartilage Acute minimally displaced fracture of the left puboacetabular junction (series 3, image 26) and left parasymphyseal superior pubic ramus (series 7, image 25). Acute nondisplaced fracture of the left inferior pubic ramus (series 3, image 50). No dislocation. The left hip joint space is relatively preserved. Mild degenerative changes of the pubic symphysis. No joint effusion. Ligaments Ligaments are suboptimally evaluated by CT. Muscles and Tendons Grossly intact. Soft tissue No fluid collection or hematoma.  No soft tissue mass. IMPRESSION: 1. Acute fractures of the left superior and inferior pubic rami. Electronically Signed   By: Obie DredgeWilliam T Derry M.D.   On: 05/15/2021 16:12  ? ?CT HIP RIGHT WO CONTRAST ? ?Result Date: 05/16/2021 ?CLINICAL DATA:  Hip replacement, periprosthetic fracture suspected EXAM: CT OF THE RIGHT HIP WITHOUT CONTRAST TECHNIQUE: Multidetector CT imaging of the right hip was performed according to the standard protocol. Multiplanar CT image reconstructions were also generated. RADIATION DOSE REDUCTION: This exam was performed according to the departmental dose-optimization program which includes automated exposure control, adjustment of the mA and/or kV according to patient size and/or use of iterative reconstruction technique. COMPARISON:  Hip radiograph 05/15/2021 FINDINGS: Bones/Joint/Cartilage There is a right hip arthroplasty in normal alignment without evidence of loosening or periprosthetic fracture. There is  a nondisplaced fracture at the right puboacetabular junction (series 3, image 54, series 6, image 23). There is a nondisplaced fracture of the left sacrum involving zones 1 and 2, extending through the S3 neural foramen and probably the S2 neural foramen. There is adjacent soft tissue swelling in the pelvis. Minimally displaced fractures of the left puboacetabular junction and left parasymphyseal superior pubic ramus. Nondisplaced left inferior pubic ramus fracture. Mild degenerative changes of the symphysis pubis. Mild to moderate sacroiliac joint degenerative changes bilaterally the right. Ligaments Grossly intact. Muscles and Tendons No acute myotendinous abnormality by CT. There is gluteal muscle and adductor muscle atrophy. Soft tissues No focal fluid collection.  Extensive sigmoid diverticulosis. IMPRESSION: Nondisplaced fracture at  the right puboacetabular junction. No evidence of right hip arthroplasty complication. Nondisplaced fracture of the left sacrum involving zones 1 and 2, extending through the S3 neural foramen and probably the S2 neural foramen. Adjacent soft tissue swelling with pelvis. Minimally displaced fractures of the left puboacetabular junction and left parasymphyseal superior pubic ramus. Nondisplaced left inferior pubic ramus fracture. Electronically Signed   By: Caprice Renshaw M.D.   On: 05/16/2021 14:16  ? ?DG Hip Unilat W or Wo Pelvis 2-3 Views Left ? ?Result Date: 05/15/2021 ?CLINICAL DATA:  Fall. EXAM: DG HIP (WITH OR WITHOUT PELVIS) 2-3V LEFT COMPARISON:  Pelvis and bilateral hip radiographs 10/15/2016 FINDINGS: There is diffuse decreased bone mineralization. The left femoroacetabular joint space is maintained. Normal normal morphology of the left femoral neck. Minimal degenerative irregularity of the pubic symphysis articular surfaces. The bilateral sacroiliac joint spaces are maintained. Status post total right hip arthroplasty, partially visualized without evidence of hardware  failure. Moderate multilevel degenerative disc changes of the visualized lumbar spine. Surgical clips again overlie the left aspect of the lumbar spine. IMPRESSION:: IMPRESSION: 1. No significant arthritis within the l

## 2021-05-17 NOTE — Progress Notes (Signed)
?PROGRESS NOTE ? ? ? ?Jasmine Benson  OFB:[id number] DOB: 07/03/1945 DOA: 05/15/2021 ?PCP: [facility]  ? ?Brief Narrative:  ?76 y.o. female with medical history significant of history of hip fracture presented with fall, left hip and rib pain that happened an evening prior to presentation.  On presentation, she was found to have  left 10th rib fracture and acute left superior and inferior pubic rami fractures.  She could hardly get up or ambulate due to severe pain.  Orthopedics/Dr. Dion Saucier was consulted who recommended conservative management  ? ?Assessment & Plan: ?  ?Possible mechanical fall ?Intractable pain ?Acute left superior and inferior pubic rami fractures ?Left 10th rib fracture ?Sacral fracture ?-Presented with possible mechanical fall with intractable pain and fractures as noted above.  ?-Still complains of significant pain but pain medications are helping.  PT recommending SNF placement.  Consult Child psychotherapist. ?-Orthopedics/Dr. Dion Saucier to evaluate the patient: Currently recommending conservative management ?-CT right hip showed sacral fracture but no fracture of right hip arthroplasty ? ?Fever ?-Patient is having intermittent fevers.  Will check UA and culture.  Check blood cultures and chest x-ray. ?  ?Hyponatremia ?-Mild.  Resolved. ? ?Hypokalemia ?-Replaced.  Resolved ? ?Normocytic anemia ?- questionable cause.  Hemoglobin stable.  Outpatient follow-up  ? ?macrocytosis ?-Outpatient follow-up ?  ?Mildly elevated LFT  ?AST mildly elevated. ?-Resolved ?  ? ?DVT prophylaxis: Lovenox ?Code Status: Full ?Family Communication: None at bedside ?Disposition Plan: ?Status is: inpatient because: Still in significant pain.  PT recommending SNF placement. ? ? ?Consultants: Orthopedics ? ?Procedures: None ? ?Antimicrobials: None ? ? ?Subjective: ?Patient seen and examined at bedside.  Still complains of significant pain in her left hip and ribs.  No nausea, vomiting, worsening  abdominal pain or diarrhea reported. ?Objective: ?Vitals:  ? 05/16/21 1235 05/16/21 1959 05/17/21 0500 05/17/21 0506  ?BP: (!) 104/59 117/70  124/64  ?Pulse: (!) 108 100  98  ?Resp: 16 18  15   ?Temp: (!) 97.4 ?F (36.3 ?C) 99.1 ?F (37.3 ?C)  100 ?F (37.8 ?C)  ?TempSrc:  Oral  Oral  ?SpO2: 93% 96%  97%  ?Weight:   68.8 kg   ?Height:      ? ? ?Intake/Output Summary (Last 24 hours) at 05/17/2021 0739 ?Last data filed at 05/17/2021 0500 ?Gross per 24 hour  ?Intake 840 ml  ?Output 2250 ml  ?Net -1410 ml  ? ? ?Filed Weights  ? 05/15/21 1114 05/16/21 0620 05/17/21 0500  ?Weight: 63.5 kg 67 kg 68.8 kg  ? ? ?Examination: ? ?General: On room air.  No distress ?ENT/neck: No thyromegaly.  JVD is not elevated  ?respiratory: Decreased breath sounds at bases bilaterally with some crackles; no wheezing CVS: S1-S2 heard, intermittently tachycardic ?Abdominal: Soft, nontender, slightly distended; no organomegaly, normal bowel sounds are heard ?Extremities: Trace lower extremity edema; no cyanosis  ?CNS: Awake and alert.  No focal neurologic deficit.  Moves extremities ?Lymph: No obvious lymphadenopathy ?Skin: No obvious ecchymosis/lesions  ?psych: Affect, judgment and mood are normal  ?musculoskeletal: Left hip tenderness present. ? ? ? ?Data Reviewed: I have personally reviewed following labs and imaging studies ? ?CBC: ?Recent Labs  ?Lab 05/15/21 ?1237 05/16/21 ?0500 05/17/21 ?0450  ?WBC 6.7 5.5 5.0  ?NEUTROABS 5.1 3.8 PENDING  ?HGB 12.7 11.4* 11.4*  ?HCT 37.1 34.5* 35.1*  ?MCV 109.1* 112.4* 111.8*  ?PLT 234 197 209  ? ? ?Basic Metabolic Panel: ?Recent Labs  ?Lab 05/15/21 ?1237 05/16/21 ?0500 05/17/21 ?0450  ?NA  134* 134* 138  ?K 4.6 3.2* 3.8  ?CL 101 103 105  ?CO2 25 24 25   ?GLUCOSE 124* 97 81  ?BUN 18 13 14   ?CREATININE 0.72 0.64 0.60  ?CALCIUM 9.2 8.7* 8.9  ?MG  --  1.9 1.9  ? ? ?GFR: ?Estimated Creatinine Clearance: 59.2 mL/min (by C-G formula based on SCr of 0.6 mg/dL). ?Liver Function Tests: ?Recent Labs  ?Lab 05/15/21 ?1237  05/17/21 ?0450  ?AST 51* 23  ?ALT 37 21  ?ALKPHOS 57 48  ?BILITOT 1.7* 0.7  ?PROT 7.8 6.2*  ?ALBUMIN 4.0 3.0*  ? ? ?No results for input(s): LIPASE, AMYLASE in the last 168 hours. ?No results for input(s): AMMONIA in the last 168 hours. ?Coagulation Profile: ?No results for input(s): INR, PROTIME in the last 168 hours. ?Cardiac Enzymes: ?No results for input(s): CKTOTAL, CKMB, CKMBINDEX, TROPONINI in the last 168 hours. ?BNP (last 3 results) ?No results for input(s): PROBNP in the last 8760 hours. ?HbA1C: ?No results for input(s): HGBA1C in the last 72 hours. ?CBG: ?No results for input(s): GLUCAP in the last 168 hours. ?Lipid Profile: ?No results for input(s): CHOL, HDL, LDLCALC, TRIG, CHOLHDL, LDLDIRECT in the last 72 hours. ?Thyroid Function Tests: ?No results for input(s): TSH, T4TOTAL, FREET4, T3FREE, THYROIDAB in the last 72 hours. ?Anemia Panel: ?No results for input(s): VITAMINB12, FOLATE, FERRITIN, TIBC, IRON, RETICCTPCT in the last 72 hours. ?Sepsis Labs: ?No results for input(s): PROCALCITON, LATICACIDVEN in the last 168 hours. ? ?No results found for this or any previous visit (from the past 240 hour(s)).  ? ? ? ? ? ?Radiology Studies: ?DG Ribs Unilateral W/Chest Left ? ?Result Date: 05/15/2021 ?CLINICAL DATA:  Fall.  Left lower anterior rib pain. EXAM: LEFT RIBS AND CHEST - 3+ VIEW COMPARISON:  Frontal chest radiographs 11/15/2016 and 05/07/2020 FINDINGS: Cardiac silhouette and mediastinal contours are within normal limits. The lungs are clear. No pleural effusion or pneumothorax. Lateral partially calcified breast implants are again noted. A marker is seen lateral to the approximate seventh through ninth ribs. There is linear lucency and minimal cortical step-off at the posterolateral aspect of the left tenth rib, an acute to subacute minimally displaced fracture. This is seen on the second and third images of this study. Severe left-greater-than-right glenohumeral joint space narrowing. IMPRESSION::  IMPRESSION: 1. No acute lung process. 2. Minimally displaced acute to subacute fracture of the posterolateral aspect of the left tenth rib. No pneumothorax. Electronically Signed   By: Neita Garnetonald  Viola M.D.   On: 05/15/2021 12:34  ? ?CT HIP LEFT WO CONTRAST ? ?Result Date: 05/15/2021 ?CLINICAL DATA:  Left leg pain after fall last night. EXAM: CT OF THE LEFT HIP WITHOUT CONTRAST TECHNIQUE: Multidetector CT imaging of the left hip was performed according to the standard protocol. Multiplanar CT image reconstructions were also generated. RADIATION DOSE REDUCTION: This exam was performed according to the departmental dose-optimization program which includes automated exposure control, adjustment of the mA and/or kV according to patient size and/or use of iterative reconstruction technique. COMPARISON:  Left hip x-rays from same day. FINDINGS: Bones/Joint/Cartilage Acute minimally displaced fracture of the left puboacetabular junction (series 3, image 26) and left parasymphyseal superior pubic ramus (series 7, image 25). Acute nondisplaced fracture of the left inferior pubic ramus (series 3, image 50). No dislocation. The left hip joint space is relatively preserved. Mild degenerative changes of the pubic symphysis. No joint effusion. Ligaments Ligaments are suboptimally evaluated by CT. Muscles and Tendons Grossly intact. Soft tissue No fluid collection or hematoma.  No soft tissue mass. IMPRESSION: 1. Acute fractures of the left superior and inferior pubic rami. Electronically Signed   By: Obie Dredge M.D.   On: 05/15/2021 16:12  ? ?CT HIP RIGHT WO CONTRAST ? ?Result Date: 05/16/2021 ?CLINICAL DATA:  Hip replacement, periprosthetic fracture suspected EXAM: CT OF THE RIGHT HIP WITHOUT CONTRAST TECHNIQUE: Multidetector CT imaging of the right hip was performed according to the standard protocol. Multiplanar CT image reconstructions were also generated. RADIATION DOSE REDUCTION: This exam was performed according to the  departmental dose-optimization program which includes automated exposure control, adjustment of the mA and/or kV according to patient size and/or use of iterative reconstruction technique. COMPARISON:  Hip radi

## 2021-05-18 DIAGNOSIS — S32592A Other specified fracture of left pubis, initial encounter for closed fracture: Secondary | ICD-10-CM | POA: Diagnosis not present

## 2021-05-18 DIAGNOSIS — R509 Fever, unspecified: Secondary | ICD-10-CM

## 2021-05-18 DIAGNOSIS — E876 Hypokalemia: Secondary | ICD-10-CM | POA: Diagnosis not present

## 2021-05-18 DIAGNOSIS — R7989 Other specified abnormal findings of blood chemistry: Secondary | ICD-10-CM | POA: Diagnosis not present

## 2021-05-18 NOTE — TOC Progression Note (Addendum)
Transition of Care (TOC) - Progression Note  ? ? ?Patient Details  ?Name: Jasmine Benson ?MRN: [id number] ?Date of Birth: 1945-09-23 ? ?Transition of Care (TOC) CM/SW Contact  ?Dessa Phi, RN ?Phone Number: ?05/18/2021, 12:46 PM ? ?Clinical Narrative:  Will provide Bed offers-await choice, prior auth.  ?-2:30p-chose [employer] rehab rep [employer] Attempted intiation of auth-unable to continue due to eligibility check needed by [employer]-Not managed by [employer] rep Claiborne Billings will start auth. ? ?1. ?1.3 mi ?[facility] ?[address] ?[place], [place] [address] ?[phone] ?Overall rating ?Average ?2. ?1.6 mi ?[facility] ?[place] ?[place], [place] [address] ?[phone] ?Overall rating ?Much below average ?3. ?2.1 mi ?[place] ?[address] ?[place], [place] [address] ?[phone] ?Overall rating ?Much below average ?4. ?2.5 mi ?[facility] ?[address] ?[place], [place] [address] ?[phone] ?Overall rating ?Below average ?5. ?2.8 mi ?[facility] [place] ?[place], [place] [address] ?[phone] ?Overall rating ?Average ?6. ?2.8 mi ?[place] ?[address] ?[place], [place] [address] ?[phone] ?Overall rating ?Much below average ?7. ?3 mi ?[place] ?[address] ?[place], [place] [address] ?[phone] ?Overall rating ?Much above average ?8. ?3.6 mi ?[place] ?[place] ?[place], [place] [address] ?[phone] ?Overall rating ?Average ?9. ?3.6 mi ?[facility] ?[place] ?[place], [place] [address] ?[phone] ?Overall rating ?Much below average ?10. ?3.9 mi ?[place] ?[place] ?[place], [place] [address] ?[phone] ?Overall rating ?Much below average ?11. ?4.4 mi ?[facility] ?[place] ?[place], [place] [address] ?[phone] ?Overall rating ?Much above average ?12. ?5.5 mi ?[place] ?[address] ?[place], [place] [address] ?[phone] ?Overall rating ?Above average ?13. ?8.2 mi ?[place] ?[place] ?[place], [place] [address] ?[phone] ?Overall rating ?Much above average ?14. ?9 mi ?[facility] ?[address] ?[place], [place] [address] ?[phone] ?Overall rating ?Above average ?15. ?9.1 mi ?[facility] ?[place] ?[place], [place] [address] ?[phone] ?Overall rating ?Average ?16. ?9.2 mi ?[place] ?[address] ?[place], [place] [address] ?[phone] ?Overall rating ?Much above average ?17. ?10.8 mi ?[place] [facility] [place] ?[place] ?[place], [place] [address] ?[phone] ?Overall rating ?Much above average ?18. ?12.6 mi ?[facility] ?[place], [place] [address] ?[phone] ?Overall rating ?Much below average ?19. ?12.8 mi ?[place] ?[address] ?[place], [place] [address] ?[phone] ?Overall rating ?Much below average ?20. ?14.2 mi ?[facility] ?[address] ?[place], [place] [address] ?[phone] ?Overall rating ?Below average ?21. ?14.4 mi ?[facility] ?[address] ?[place], [place] [address] ?[phone] ?Overall rating ?Above average ?22. ?14.8 mi ?[facility] ?[address] ?[place], [place] [address] ?[phone] ?Overall rating ?Much above average ?23. ?14.9 mi ?[place] ?[place] ?[place], [place] [address] ?[phone] ?Overall rating ?Much below average ?24. ?16.5 mi ?[facility] ?[address] ?[place], [place] [address] ?[phone] ?Overall rating ?Average ?25. ?16.7 mi ?[place] ?[place] ?[place],  [address] ?[phone] ?  Overall rating ?Much above average ?26. ?17.9 mi ?[facility] ?[address] ?[place], [place] [address] ?[phone] ?Overall rating ?Below average ?27. ?18.1 mi ?[facility] ?[address] ?[place], [place] [address] ?[phone] ?Overall rating ?Much below average ?28. ?19.7 mi ?[place] ?[place], [place] [address] ?[phone] ?Overall rating ?Much below average ?29. ?20 mi ?[facility] [place] ?[place], [place] [address] ?[phone] ?Overall rating ?Much above average ?30. ?21.1 mi ?[place] ?[place] ?[place], [place] [address] ?[phone] ?Overall rating ?Much below average ?31. ?21.6 mi ?[facility] ?[place] ?[place], [place] [address] ?[phone] ?Overall rating ?Below average ?32. ?21.6 mi ?[facility] ?[place] ?[place], [place] [address] ?[phone] ?Overall rating ?Average ?33. ?21.8 mi ?[place] ?[facility] ?[place], [place] [address] ?[phone] ?Overall rating ?Much above average ?34. ?22 mi ?[place] ?[address] ?[place], [place] [address] ?[phone] ?Overall rating ?Much above average ?35. ?22.6 mi ?[place] ?[address] ?[place], [place] [address] ?[phone] ?Overall rating ?Average ?36. ?22.7 mi ?[facility] ?[place], [place] [address] ?[phone] ?Overall rating ?Much below average ?37. ?23.3 mi ?[facility] ?[address] ?[place], [place] [address] ?[phone] ?Overall rating ?Above average ?38. ?23.5 mi ?[place] ?[address] ?[place], [place] [address] ?[phone] ?Overall  rating ?Not available18 ?39. ?24.1 mi ?[facility]  ?[address] ?[place], [place] [address] ?[phone] ?Overall rating ?Below average ?40. ?24.2 mi ?[place] ?[address] ?[place], [place] [address] ?[phone] ?Overall rating ?Below average ?41. ?24.4 mi ?[facility] ?[address] ?[place], [place] [address] ?[phone] ?Overall rating ?Much below average ?42. ?24.5 mi ?[facility] ?[address] ?[place],  [address] ?[phone] ?Overall rating ?Above average ? ?Expected Discharge Plan: [employer] ?Barriers to Discharge: Ship broker, Continued Medical Work up ? ?Expected Discharge Plan and Services ?Expected Discharge Plan: [place] ?  ?  ?  ?  ?                ?  ?  ?  ?  ?  ?  ?  ?  ?  ?  ? ? ?Social Determinants of Health (SDOH) Interventions ?  ? ?Readmission Risk Interventions ?   ? View : No data to display.  ?  ?  ?  ? ? ?

## 2021-05-18 NOTE — Progress Notes (Signed)
Occupational Therapy Treatment ?Patient Details ?Name: Jasmine Benson ?MRN: [id number] ?DOB: 1945/07/14 ?Today's Date: 05/18/2021 ? ? ?History of present illness Jasmine Benson is a 76 y.o. female with history of previous right hip fracture status post hemiarthroplasty presenting to the ED yesterday after a fall.  She fell on her left side on the evening of 05/14/2021.  She slipped on her kitchen floor.  At the time of the fall she had severe left leg and left rib cage pain.  She presented to the emergency department on 05/15/2021.  X-rays were taken as well as CT of the left hip which showed simple left superior and inferior pubic rami fractures rib fracture.. ?  ?OT comments ? Patient was educated on importance of movement with pain to prevent atrophy. Patient verbalized and demonstrated understanding. Patient was min A to transfer from edge of bed to recliner in room with increased vocalizations with movement. Patient had visitor arrive limiting session on this date. Patient would continue to benefit from skilled OT services at this time while admitted and after d/c to address noted deficits in order to improve overall safety and independence in ADLs.  ?  ? ?Recommendations for follow up therapy are one component of a multi-disciplinary discharge planning process, led by the attending physician.  Recommendations may be updated based on patient status, additional functional criteria and insurance authorization. ?   ?Follow Up Recommendations ? Skilled nursing-short term rehab (<3 hours/day)  ?  ?Assistance Recommended at Discharge Frequent or constant Supervision/Assistance  ?Patient can return home with the following ? A little help with walking and/or transfers;A lot of help with bathing/dressing/bathroom;Assistance with cooking/housework;Help with stairs or ramp for entrance;Assist for transportation ?  ?Equipment Recommendations ? BSC/3in1  ?  ?Recommendations for Other Services   ? ?  ?Precautions /  Restrictions Precautions ?Precautions: Fall ?Precaution Comments: jerking movements with pain ?Restrictions ?Weight Bearing Restrictions: No ?RLE Weight Bearing: Weight bearing as tolerated ?LLE Weight Bearing: Weight bearing as tolerated  ? ? ?  ? ?Mobility Bed Mobility ?Overal bed mobility: Needs Assistance ?Bed Mobility: Supine to Sit ?  ?  ?Supine to sit: Min assist, HOB elevated ?  ?  ?General bed mobility comments: patient was educated on log rolling/turning technique to reduce twisting on back. patient was motivated to participate ?  ? ?Transfers ?  ?  ?  ?  ?  ?  ?  ?  ?  ?  ?  ?  ?Balance Overall balance assessment: Needs assistance ?Sitting-balance support: No upper extremity supported ?Sitting balance-Leahy Scale: Fair ?  ?  ?Standing balance support: Bilateral upper extremity supported, Reliant on assistive device for balance ?Standing balance-Leahy Scale: Poor ?  ?  ?  ?  ?  ?  ?  ?  ?  ?  ?  ?  ?   ? ?ADL either performed or assessed with clinical judgement  ? ?ADL Overall ADL's : Needs assistance/impaired ?  ?  ?Grooming: Set up;Sitting ?  ?  ?  ?  ?  ?  ?  ?  ?  ?  ?Toilet Transfer Details (indicate cue type and reason): patient was min A to transfer from edge of bed to recliner in room with RW with increased time and patietn vocalizing in pain with all movements. ?  ?  ?  ?  ?  ?General ADL Comments: patient was educated on importance of movement even with pain to prevent atrophy. patient verbalized and demonstrated understanding. patient was  educated on using ice for additional pain management. patient verbalized understading and reported she would try it with nursing. nurse was supplying patient with ice at end of session. ?  ? ?Extremity/Trunk Assessment   ?  ?  ?  ?  ?  ? ?Vision   ?  ?  ?Perception   ?  ?Praxis   ?  ? ?Cognition Arousal/Alertness: Awake/alert ?Behavior During Therapy: [facility] for tasks assessed/performed ?Overall Cognitive Status: Within Functional Limits for tasks assessed ?  ?  ?   ?  ?  ?  ?  ?  ?  ?  ?  ?  ?  ?  ?  ?  ?General Comments: patient had nurse and friend in room to motivate her to get out of bed. ?  ?  ?   ?Exercises   ? ?  ?Shoulder Instructions   ? ? ?  ?General Comments    ? ? ?Pertinent Vitals/ Pain       Pain Assessment ?Faces Pain Scale: Hurts whole lot ?Pain Location: Rib and LLE with movement ?Pain Descriptors / Indicators: Grimacing, Sharp, Shooting, Guarding ?Pain Intervention(s): Limited activity within patient's tolerance, Monitored during session, Premedicated before session, Repositioned ? ?Home Living   ?  ?  ?  ?  ?  ?  ?  ?  ?  ?  ?  ?  ?  ?  ?  ?  ?  ?  ? ?  ?Prior Functioning/Environment    ?  ?  ?  ?   ? ?Frequency ? Min 2X/week  ? ? ? ? ?  ?Progress Toward Goals ? ?OT Goals(current goals can now be found in the care plan section) ? Progress towards OT goals: Progressing toward goals ? ?   ?Plan Discharge plan remains appropriate   ? ?Co-evaluation ? ? ?   ?  ?  ?  ?  ? ?  ?AM-PAC OT "6 Clicks" Daily Activity     ?Outcome Measure ? ? Help from another person eating meals?: None ?Help from another person taking care of personal grooming?: A Little ?Help from another person toileting, which includes using toliet, bedpan, or urinal?: A Lot ?Help from another person bathing (including washing, rinsing, drying)?: A Lot ?Help from another person to put on and taking off regular upper body clothing?: A Little ?Help from another person to put on and taking off regular lower body clothing?: A Lot ?6 Click Score: 16 ? ?  ?End of Session Equipment Utilized During Treatment: Rolling walker (2 wheels);Gait belt ? ?OT Visit Diagnosis: Pain ?  ?Activity Tolerance Patient limited by pain ?  ?Patient Left in chair;with call bell/phone within reach;with chair alarm set;with family/visitor present;with nursing/sitter in room ?  ?Nurse Communication Other (comment) (nurse present for session) ?  ? ?   ? ?Time: 4627-0350 ?OT Time Calculation (min): 16 min ? ?Charges: OT General  Charges ?$OT Visit: 1 Visit ?OT Treatments ?$Therapeutic Activity: 8-22 mins ? ?Jasmine Benson OTR/L, MS ?Acute Rehabilitation Department ?Office# [phone] ?Pager# [phone] ? ? ?Barnabas Lister Illa Enlow ?05/18/2021, 4:25 PM ?

## 2021-05-18 NOTE — Progress Notes (Signed)
? ? ? ?Subjective: ?  ? Patient is alert, oriented. Reports minimal pain at rest. Severe pain with any movement at left hip. Right hip pain improved. Denies any loss of bowel or bladder control. Voiding, no bowel movement yet, is passing gas. Has had some severe muscle spasms in left leg but is improving. Denies chest pain, SOB, Calf pain. No nausea/vomiting. No other complaints. ?  ? ?Objective:  ?PE: ?VITALS:   ?Vitals:  ? 05/17/21 1852 05/17/21 1900 05/18/21 0500 05/18/21 0505  ?BP: 95/61 105/63  (!) 141/86  ?Pulse: 86 91  (!) 108  ?Resp: 18 20  20   ?Temp:  98.1 ?F (36.7 ?C)  99.2 ?F (37.3 ?C)  ?TempSrc:    Oral  ?SpO2: 97% 97%  97%  ?Weight:   69.1 kg   ?Height:      ? ?General: sitting up in bed, in no acute distress ?MSK: Dorsiflexion plantarflexion intact bilaterally.  2+ DP pulses bilaterally.  Sensation intact all aspects of bilateral feet. Continues to have severe pain with active movement at left hip.  ? ? ?LABS ? ?Results for orders placed or performed during the hospital encounter of 05/15/21 (from the past 24 hour(s))  ?Urinalysis, Routine w reflex microscopic Urine, Clean Catch     Status: Abnormal  ? Collection Time: 05/17/21  4:26 PM  ?Result Value Ref Range  ? Color, Urine YELLOW YELLOW  ? APPearance CLEAR CLEAR  ? Specific Gravity, Urine 1.020 1.005 - 1.030  ? pH 5.5 5.0 - 8.0  ? Glucose, UA NEGATIVE NEGATIVE mg/dL  ? Hgb urine dipstick NEGATIVE NEGATIVE  ? Bilirubin Urine SMALL (A) NEGATIVE  ? Ketones, ur NEGATIVE NEGATIVE mg/dL  ? Protein, ur NEGATIVE NEGATIVE mg/dL  ? Nitrite NEGATIVE NEGATIVE  ? Leukocytes,Ua TRACE (A) NEGATIVE  ?Urinalysis, Microscopic (reflex)     Status: None  ? Collection Time: 05/17/21  4:26 PM  ?Result Value Ref Range  ? RBC / HPF NONE SEEN 0 - 5 RBC/hpf  ? WBC, UA 6-10 0 - 5 WBC/hpf  ? Bacteria, UA NONE SEEN NONE SEEN  ? Squamous Epithelial / LPF NONE SEEN 0 - 5  ? ? ?CT HIP RIGHT WO CONTRAST ? ?Result Date: 05/16/2021 ?CLINICAL DATA:  Hip replacement, periprosthetic  fracture suspected EXAM: CT OF THE RIGHT HIP WITHOUT CONTRAST TECHNIQUE: Multidetector CT imaging of the right hip was performed according to the standard protocol. Multiplanar CT image reconstructions were also generated. RADIATION DOSE REDUCTION: This exam was performed according to the departmental dose-optimization program which includes automated exposure control, adjustment of the mA and/or kV according to patient size and/or use of iterative reconstruction technique. COMPARISON:  Hip radiograph 05/15/2021 FINDINGS: Bones/Joint/Cartilage There is a right hip arthroplasty in normal alignment without evidence of loosening or periprosthetic fracture. There is a nondisplaced fracture at the right puboacetabular junction (series 3, image 54, series 6, image 23). There is a nondisplaced fracture of the left sacrum involving zones 1 and 2, extending through the S3 neural foramen and probably the S2 neural foramen. There is adjacent soft tissue swelling in the pelvis. Minimally displaced fractures of the left puboacetabular junction and left parasymphyseal superior pubic ramus. Nondisplaced left inferior pubic ramus fracture. Mild degenerative changes of the symphysis pubis. Mild to moderate sacroiliac joint degenerative changes bilaterally the right. Ligaments Grossly intact. Muscles and Tendons No acute myotendinous abnormality by CT. There is gluteal muscle and adductor muscle atrophy. Soft tissues No focal fluid collection.  Extensive sigmoid diverticulosis. IMPRESSION: Nondisplaced fracture  at the right puboacetabular junction. No evidence of right hip arthroplasty complication. Nondisplaced fracture of the left sacrum involving zones 1 and 2, extending through the S3 neural foramen and probably the S2 neural foramen. Adjacent soft tissue swelling with pelvis. Minimally displaced fractures of the left puboacetabular junction and left parasymphyseal superior pubic ramus. Nondisplaced left inferior pubic ramus  fracture. Electronically Signed   By: Caprice Renshaw M.D.   On: 05/16/2021 14:16  ? ?DG CHEST PORT 1 VIEW ? ?Result Date: 05/17/2021 ?CLINICAL DATA:  Fever. EXAM: PORTABLE CHEST 1 VIEW COMPARISON:  05/07/2020 and older exams. FINDINGS: Cardiac silhouette is normal in size. No mediastinal or hilar masses. Clear lungs.  No convincing pleural effusion.  No pneumothorax. Skeletal structures are demineralized. Old left rib fractures are noted. IMPRESSION: No acute cardiopulmonary disease. Electronically Signed   By: Amie Portland M.D.   On: 05/17/2021 09:03   ? ?Assessment/Plan: ?Left superior and inferior pubic rami fractures with Zone 1/Zone 2 sacrum fractures in patient with history of right hip hemiarthroplasty ?- Discussed fractures with Dr. Jena Gauss with orthopedic trauma team who also recommended non-operative treatment ?- Continue WBAT ?- Ok to work with therapy ?- lovenox for DVT prophylaxis ?- ok to discharge to SNF from ortho standpoint when placement available.   ? ?Contact information:   ?Weekdays [address] [place] [phone] A ?fter hours and holidays please check [email or web address] for group call information for [facility] ? ?Jasmine Benson ?05/18/2021, 12:31 PM  ?

## 2021-05-18 NOTE — Progress Notes (Signed)
?PROGRESS NOTE ? ? ? ?Jasmine Benson  [id number] DOB: 1945/10/07 DOA: 05/15/2021 ?PCP: [facility] [place]  ? ?Brief Narrative:  ?76 y.o. female with medical history significant of history of hip fracture presented with fall, left hip and rib pain that happened an evening prior to presentation.  On presentation, she was found to have  left 10th rib fracture and acute left superior and inferior pubic rami fractures.  She could hardly get up or ambulate due to severe pain.  Orthopedics/Dr. Mardelle Matte was consulted who recommended conservative management.  PT recommended SNF placement. ? ?Assessment & Plan: ?  ?Possible mechanical fall ?Intractable pain ?Acute left superior and inferior pubic rami fractures ?Left 10th rib fracture ?Sacral fracture ?-Presented with possible mechanical fall with intractable pain and fractures as noted above.  ?-Orthopedics/Dr. Mardelle Matte to evaluate the patient: Currently recommending conservative management ?-CT right hip showed sacral fracture but no fracture of right hip arthroplasty ?-PT recommended SNF placement.  Social worker following.  Currently medically stable for discharge to SNF. ? ?Fever ?-No temperature spikes over the last 24 hours.  UA, chest x-ray unremarkable.  Follow cultures.   ? ?Hyponatremia ?-Mild.  Resolved. ? ?Hypokalemia ?-Replaced.  Resolved ? ?Normocytic anemia ?- questionable cause.  Hemoglobin stable.  Outpatient follow-up  ? ?macrocytosis ?-Outpatient follow-up ?  ?Mildly elevated LFT  ?AST mildly elevated. ?-Resolved ?  ? ?DVT prophylaxis: Lovenox ?Code Status: Full ?Family Communication: None at bedside ?Disposition Plan: ?Status is: inpatient because: Still in significant pain.  PT recommending SNF placement.  Currently medically stable for discharge to SNF ? ? ?Consultants: Orthopedics ? ?Procedures: None ? ?Antimicrobials: None ? ? ?Subjective: ?Patient seen and examined at bedside.  Denies overnight fever, vomiting, chest pain  or worsening shortness of breath.  Still complains of left hip pain. ?Objective: ?Vitals:  ? 05/17/21 1852 05/17/21 1900 05/18/21 0500 05/18/21 0505  ?BP: 95/61 105/63  (!) 141/86  ?Pulse: 86 91  (!) 108  ?Resp: 18 20  20   ?Temp:  98.1 ?F (36.7 ?C)  99.2 ?F (37.3 ?C)  ?TempSrc:    Oral  ?SpO2: 97% 97%  97%  ?Weight:   69.1 kg   ?Height:      ? ? ?Intake/Output Summary (Last 24 hours) at 05/18/2021 0737 ?Last data filed at 05/18/2021 0500 ?Gross per 24 hour  ?Intake 480 ml  ?Output 1900 ml  ?Net -1420 ml  ? ? ?Filed Weights  ? 05/16/21 0620 05/17/21 0500 05/18/21 0500  ?Weight: 67 kg 68.8 kg 69.1 kg  ? ? ?Examination: ? ?General: No acute distress.  Currently on room air. ?respiratory: Bilateral decreased breath sounds at bases bilaterally with scattered crackles  ?CVS: Currently tachycardic; S1-S2 heard  ?abdominal: Soft, nontender, distended mildly; no organomegaly; bowel sounds heard extremities: No clubbing; mild lower extremity edema present ? ? ? ? ?Data Reviewed: I have personally reviewed following labs and imaging studies ? ?CBC: ?Recent Labs  ?Lab 05/15/21 ?1237 05/16/21 ?0500 05/17/21 ?0450  ?WBC 6.7 5.5 5.0  ?NEUTROABS 5.1 3.8 2.8  ?HGB 12.7 11.4* 11.4*  ?HCT 37.1 34.5* 35.1*  ?MCV 109.1* 112.4* 111.8*  ?PLT 234 197 209  ? ? ?Basic Metabolic Panel: ?Recent Labs  ?Lab 05/15/21 ?1237 05/16/21 ?0500 05/17/21 ?0450  ?NA 134* 134* 138  ?K 4.6 3.2* 3.8  ?CL 101 103 105  ?CO2 25 24 25   ?GLUCOSE 124* 97 81  ?BUN 18 13 14   ?CREATININE 0.72 0.64 0.60  ?CALCIUM 9.2 8.7* 8.9  ?MG  --  1.9 1.9  ? ? ?GFR: ?Estimated Creatinine Clearance: 59.3 mL/min (by C-G formula based on SCr of 0.6 mg/dL). ?Liver Function Tests: ?Recent Labs  ?Lab 05/15/21 ?1237 05/17/21 ?0450  ?AST 51* 23  ?ALT 37 21  ?ALKPHOS 57 48  ?BILITOT 1.7* 0.7  ?PROT 7.8 6.2*  ?ALBUMIN 4.0 3.0*  ? ? ?No results for input(s): LIPASE, AMYLASE in the last 168 hours. ?No results for input(s): AMMONIA in the last 168 hours. ?Coagulation Profile: ?No results for  input(s): INR, PROTIME in the last 168 hours. ?Cardiac Enzymes: ?No results for input(s): CKTOTAL, CKMB, CKMBINDEX, TROPONINI in the last 168 hours. ?BNP (last 3 results) ?No results for input(s): PROBNP in the last 8760 hours. ?HbA1C: ?No results for input(s): HGBA1C in the last 72 hours. ?CBG: ?No results for input(s): GLUCAP in the last 168 hours. ?Lipid Profile: ?No results for input(s): CHOL, HDL, LDLCALC, TRIG, CHOLHDL, LDLDIRECT in the last 72 hours. ?Thyroid Function Tests: ?No results for input(s): TSH, T4TOTAL, FREET4, T3FREE, THYROIDAB in the last 72 hours. ?Anemia Panel: ?No results for input(s): VITAMINB12, FOLATE, FERRITIN, TIBC, IRON, RETICCTPCT in the last 72 hours. ?Sepsis Labs: ?No results for input(s): PROCALCITON, LATICACIDVEN in the last 168 hours. ? ?No results found for this or any previous visit (from the past 240 hour(s)).  ? ? ? ? ? ?Radiology Studies: ?CT HIP RIGHT WO CONTRAST ? ?Result Date: 05/16/2021 ?CLINICAL DATA:  Hip replacement, periprosthetic fracture suspected EXAM: CT OF THE RIGHT HIP WITHOUT CONTRAST TECHNIQUE: Multidetector CT imaging of the right hip was performed according to the standard protocol. Multiplanar CT image reconstructions were also generated. RADIATION DOSE REDUCTION: This exam was performed according to the departmental dose-optimization program which includes automated exposure control, adjustment of the mA and/or kV according to patient size and/or use of iterative reconstruction technique. COMPARISON:  Hip radiograph 05/15/2021 FINDINGS: Bones/Joint/Cartilage There is a right hip arthroplasty in normal alignment without evidence of loosening or periprosthetic fracture. There is a nondisplaced fracture at the right puboacetabular junction (series 3, image 54, series 6, image 23). There is a nondisplaced fracture of the left sacrum involving zones 1 and 2, extending through the S3 neural foramen and probably the S2 neural foramen. There is adjacent soft tissue  swelling in the pelvis. Minimally displaced fractures of the left puboacetabular junction and left parasymphyseal superior pubic ramus. Nondisplaced left inferior pubic ramus fracture. Mild degenerative changes of the symphysis pubis. Mild to moderate sacroiliac joint degenerative changes bilaterally the right. Ligaments Grossly intact. Muscles and Tendons No acute myotendinous abnormality by CT. There is gluteal muscle and adductor muscle atrophy. Soft tissues No focal fluid collection.  Extensive sigmoid diverticulosis. IMPRESSION: Nondisplaced fracture at the right puboacetabular junction. No evidence of right hip arthroplasty complication. Nondisplaced fracture of the left sacrum involving zones 1 and 2, extending through the S3 neural foramen and probably the S2 neural foramen. Adjacent soft tissue swelling with pelvis. Minimally displaced fractures of the left puboacetabular junction and left parasymphyseal superior pubic ramus. Nondisplaced left inferior pubic ramus fracture. Electronically Signed   By: Maurine Simmering M.D.   On: 05/16/2021 14:16  ? ?DG CHEST PORT 1 VIEW ? ?Result Date: 05/17/2021 ?CLINICAL DATA:  Fever. EXAM: PORTABLE CHEST 1 VIEW COMPARISON:  05/07/2020 and older exams. FINDINGS: Cardiac silhouette is normal in size. No mediastinal or hilar masses. Clear lungs.  No convincing pleural effusion.  No pneumothorax. Skeletal structures are demineralized. Old left rib fractures are noted. IMPRESSION: No acute cardiopulmonary disease. Electronically Signed  By: Lajean Manes M.D.   On: 05/17/2021 09:03   ? ? ? ? ? ?Scheduled Meds: ? citalopram  20 mg Oral Daily  ? enoxaparin (LOVENOX) injection  40 mg Subcutaneous Q24H  ? ?Continuous Infusions: ? ? ? ? ? ? ? ?Aline August, MD ?[facility] ?05/18/2021, 7:37 AM  ? ?

## 2021-05-19 DIAGNOSIS — S3210XA Unspecified fracture of sacrum, initial encounter for closed fracture: Secondary | ICD-10-CM

## 2021-05-19 DIAGNOSIS — R52 Pain, unspecified: Secondary | ICD-10-CM | POA: Diagnosis not present

## 2021-05-19 DIAGNOSIS — R7989 Other specified abnormal findings of blood chemistry: Secondary | ICD-10-CM | POA: Diagnosis not present

## 2021-05-19 DIAGNOSIS — S32592A Other specified fracture of left pubis, initial encounter for closed fracture: Secondary | ICD-10-CM | POA: Diagnosis not present

## 2021-05-19 DIAGNOSIS — R509 Fever, unspecified: Secondary | ICD-10-CM | POA: Diagnosis not present

## 2021-05-19 LAB — URINE CULTURE

## 2021-05-19 MED ORDER — ROSUVASTATIN CALCIUM 5 MG PO TABS
5.0000 mg | ORAL_TABLET | Freq: Every day | ORAL | Status: DC
Start: 2021-05-19 — End: 2021-05-22
  Administered 2021-05-19 – 2021-05-21 (×3): 5 mg via ORAL
  Filled 2021-05-19 (×3): qty 1

## 2021-05-19 MED ORDER — METHOCARBAMOL 500 MG PO TABS
1000.0000 mg | ORAL_TABLET | Freq: Three times a day (TID) | ORAL | Status: DC | PRN
  Administered 2021-05-19 – 2021-05-21 (×4): 1000 mg via ORAL
  Filled 2021-05-19 (×4): qty 2

## 2021-05-19 MED ORDER — EZETIMIBE 10 MG PO TABS
10.0000 mg | ORAL_TABLET | Freq: Every day | ORAL | Status: DC
Start: 2021-05-19 — End: 2021-05-22
  Administered 2021-05-19 – 2021-05-21 (×3): 10 mg via ORAL
  Filled 2021-05-19 (×3): qty 1

## 2021-05-19 MED ORDER — MORPHINE SULFATE (PF) 4 MG/ML IV SOLN
4.0000 mg | INTRAVENOUS | Status: DC | PRN
  Administered 2021-05-19 – 2021-05-20 (×4): 4 mg via INTRAVENOUS
  Filled 2021-05-19 (×4): qty 1

## 2021-05-19 MED ORDER — TRAZODONE HCL 50 MG PO TABS
150.0000 mg | ORAL_TABLET | Freq: Every day | ORAL | Status: DC
  Administered 2021-05-19 – 2021-05-20 (×2): 150 mg via ORAL
  Filled 2021-05-19 (×2): qty 1

## 2021-05-19 MED ORDER — VITAMIN D 25 MCG (1000 UNIT) PO TABS
1000.0000 [IU] | ORAL_TABLET | Freq: Every day | ORAL | Status: DC
  Administered 2021-05-19 – 2021-05-21 (×3): 1000 [IU] via ORAL
  Filled 2021-05-19 (×3): qty 1

## 2021-05-19 MED ORDER — FOLIC ACID 1 MG PO TABS
1.0000 mg | ORAL_TABLET | Freq: Every day | ORAL | Status: DC
Start: 2021-05-19 — End: 2021-05-22
  Administered 2021-05-19 – 2021-05-21 (×3): 1 mg via ORAL
  Filled 2021-05-19 (×3): qty 1

## 2021-05-19 NOTE — Progress Notes (Signed)
?PROGRESS NOTE ? ? ? ?Jasmine Benson  [id number] DOB: Mar 22, 1945 DOA: 05/15/2021 ?PCP: Darrin Nipperollege, [facility] [place]  ? ?Brief Narrative:  ?76 y.o. female with medical history significant of history of hip fracture presented with fall, left hip and rib pain that happened an evening prior to presentation.  On presentation, she was found to have  left 10th rib fracture and acute left superior and inferior pubic rami fractures.  She could hardly get up or ambulate due to severe pain.  Orthopedics/Dr. Dion SaucierLandau was consulted who recommended conservative management.  PT recommended SNF placement. ? ?Assessment & Plan: ?  ?Possible mechanical fall ?Intractable pain ?Acute left superior and inferior pubic rami fractures ?Left 10th rib fracture ?Sacral fracture ?-Presented with possible mechanical fall with intractable pain and fractures as noted above.  ?-Orthopedics/Dr. Dion SaucierLandau to evaluate the patient: Currently recommending conservative management ?-CT right hip showed sacral fracture but no fracture of right hip arthroplasty ?-PT recommended SNF placement.  Social worker following.  Currently medically stable for discharge to SNF. ? ?Fever ?-No temperature spikes over the last 48 hours.  UA, chest x-ray unremarkable.  Cultures negative so far. ? ?Hyponatremia ?-Mild.  Resolved.  No labs today. ? ?Hypokalemia ?-Replaced.  Resolved ? ?Normocytic anemia ?- questionable cause.  Hemoglobin stable.  Outpatient follow-up  ? ?macrocytosis ?-Outpatient follow-up ?  ?Mildly elevated LFT  ?AST mildly elevated. ?-Resolved ?  ? ?DVT prophylaxis: Lovenox ?Code Status: Full ?Family Communication: None at bedside ?Disposition Plan: ?Status is: inpatient because: Of need for SNF placement.  Currently medically stable for discharge to SNF ? ? ?Consultants: Orthopedics ? ?Procedures: None ? ?Antimicrobials: None ? ? ?Subjective: ?Patient seen and examined at bedside.  Complains of intermittent left hip pain.  No chest  pain, vomiting, fever reported.   ?Objective: ?Vitals:  ? 05/18/21 0505 05/18/21 1322 05/18/21 2002 05/19/21 0528  ?BP: (!) 141/86 104/75 (!) 102/51 118/66  ?Pulse: (!) 108 (!) 102 96 (!) 53  ?Resp: 20 20 15 14   ?Temp: 99.2 ?F (37.3 ?C) 98.3 ?F (36.8 ?C) 98.1 ?F (36.7 ?C) 99.8 ?F (37.7 ?C)  ?TempSrc: Oral Oral Oral Oral  ?SpO2: 97% 95% 92% (!) 71%  ?Weight:      ?Height:      ? ? ?Intake/Output Summary (Last 24 hours) at 05/19/2021 0740 ?Last data filed at 05/19/2021 0530 ?Gross per 24 hour  ?Intake 360 ml  ?Output 1350 ml  ?Net -990 ml  ? ? ?Filed Weights  ? 05/16/21 0620 05/17/21 0500 05/18/21 0500  ?Weight: 67 kg 68.8 kg 69.1 kg  ? ? ?Examination: ? ?General: On room air.  No distress.   ?Respiratory: Decreased breath sounds at bases bilaterally, no wheezing ?CVS: S1-S2 heard; intermittently bradycardic and tachycardic  ?abdominal: Soft, nontender, slightly distended; no organomegaly; normal bowel sounds are heard  ?extremities: Trace lower extremity edema present; no cyanosis ? ? ? ?Data Reviewed: I have personally reviewed following labs and imaging studies ? ?CBC: ?Recent Labs  ?Lab 05/15/21 ?1237 05/16/21 ?0500 05/17/21 ?0450  ?WBC 6.7 5.5 5.0  ?NEUTROABS 5.1 3.8 2.8  ?HGB 12.7 11.4* 11.4*  ?HCT 37.1 34.5* 35.1*  ?MCV 109.1* 112.4* 111.8*  ?PLT 234 197 209  ? ? ?Basic Metabolic Panel: ?Recent Labs  ?Lab 05/15/21 ?1237 05/16/21 ?0500 05/17/21 ?0450  ?NA 134* 134* 138  ?K 4.6 3.2* 3.8  ?CL 101 103 105  ?CO2 25 24 25   ?GLUCOSE 124* 97 81  ?BUN 18 13 14   ?CREATININE 0.72 0.64 0.60  ?  CALCIUM 9.2 8.7* 8.9  ?MG  --  1.9 1.9  ? ? ?GFR: ?Estimated Creatinine Clearance: 59.3 mL/min (by C-G formula based on SCr of 0.6 mg/dL). ?Liver Function Tests: ?Recent Labs  ?Lab 05/15/21 ?1237 05/17/21 ?0450  ?AST 51* 23  ?ALT 37 21  ?ALKPHOS 57 48  ?BILITOT 1.7* 0.7  ?PROT 7.8 6.2*  ?ALBUMIN 4.0 3.0*  ? ? ?No results for input(s): LIPASE, AMYLASE in the last 168 hours. ?No results for input(s): AMMONIA in the last 168  hours. ?Coagulation Profile: ?No results for input(s): INR, PROTIME in the last 168 hours. ?Cardiac Enzymes: ?No results for input(s): CKTOTAL, CKMB, CKMBINDEX, TROPONINI in the last 168 hours. ?BNP (last 3 results) ?No results for input(s): PROBNP in the last 8760 hours. ?HbA1C: ?No results for input(s): HGBA1C in the last 72 hours. ?CBG: ?No results for input(s): GLUCAP in the last 168 hours. ?Lipid Profile: ?No results for input(s): CHOL, HDL, LDLCALC, TRIG, CHOLHDL, LDLDIRECT in the last 72 hours. ?Thyroid Function Tests: ?No results for input(s): TSH, T4TOTAL, FREET4, T3FREE, THYROIDAB in the last 72 hours. ?Anemia Panel: ?No results for input(s): VITAMINB12, FOLATE, FERRITIN, TIBC, IRON, RETICCTPCT in the last 72 hours. ?Sepsis Labs: ?No results for input(s): PROCALCITON, LATICACIDVEN in the last 168 hours. ? ?Recent Results (from the past 240 hour(s))  ?Culture, blood (routine x 2)     Status: None (Preliminary result)  ? Collection Time: 05/17/21  8:25 AM  ? Specimen: Left Antecubital; Blood  ?Result Value Ref Range Status  ? Specimen Description   Final  ?  LEFT ANTECUBITAL ?Performed at [facility], [address], [place], [place] [address] ?  ? Special Requests   Final  ?  BOTTLES DRAWN AEROBIC AND ANAEROBIC Blood Culture adequate volume ?Performed at [facility], [address] [place], [place] [address] ?  ? Culture   Final  ?  NO GROWTH < 24 HOURS ?Performed at [facility], [address], [place], [place] [address] ?  ? Report Status PENDING  Incomplete  ?Culture, blood (routine x 2)     Status: None (Preliminary result)  ? Collection Time: 05/17/21  8:25 AM  ? Specimen: BLOOD LEFT HAND  ?Result Value Ref Range Status  ? Specimen Description   Final  ?  BLOOD LEFT HAND ?Performed at [facility], [address] [place], [place] [address] ?  ? Special Requests   Final  ?  BOTTLES DRAWN AEROBIC ONLY Blood Culture adequate  volume ?Performed at [facility], [address] [place], [place] [address] ?  ? Culture   Final  ?  NO GROWTH < 24 HOURS ?Performed at [facility], [address] [place], [place] [address] ?  ? Report Status PENDING  Incomplete  ?  ? ? ? ? ? ?Radiology Studies: ?DG CHEST PORT 1 VIEW ? ?Result Date: 05/17/2021 ?CLINICAL DATA:  Fever. EXAM: PORTABLE CHEST 1 VIEW COMPARISON:  05/07/2020 and older exams. FINDINGS: Cardiac silhouette is normal in size. No mediastinal or hilar masses. Clear lungs.  No convincing pleural effusion.  No pneumothorax. Skeletal structures are demineralized. Old left rib fractures are noted. IMPRESSION: No acute cardiopulmonary disease. Electronically Signed   By: Amie Portland M.D.   On: 05/17/2021 09:03   ? ? ? ? ? ?Scheduled Meds: ? citalopram  20 mg Oral Daily  ? enoxaparin (LOVENOX) injection  40 mg Subcutaneous Q24H  ? ?Continuous Infusions: ? ? ? ? ? ? ? ?  Glade Lloyd, MD ?[facility] ?05/19/2021, 7:40 AM  ? ?

## 2021-05-19 NOTE — Progress Notes (Signed)
Physical Therapy Treatment ?Patient Details ?Name: Jasmine Benson ?MRN: [id number] ?DOB: 1945/07/25 ?Today's Date: 05/19/2021 ? ? ?History of Present Illness Jasmine Benson is a 76 y.o. female with history of previous right hip fracture status post hemiarthroplasty presenting to the ED yesterday after a fall.  She fell on her left side on the evening of 05/14/2021.  She slipped on her kitchen floor.  At the time of the fall she had severe left leg and left rib cage pain.  She presented to the emergency department on 05/15/2021.  X-rays were taken as well as CT of the left hip which showed simple left superior and inferior pubic rami fractures rib fracture.. ? ?  ?PT Comments  ? ? General Comments: AxO x 3 but admits to screaming when "the pain hits".  Anxiety.  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 ?Assisted OOB required increased time and caution. General bed mobility comments: Required increased time and use of pad to complete scooting to EOB which was most difficult for her due to pelvic Fx.  "Just so you know, I will scream".  Assisted slowly and with caution, pt did not scream. General transfer comment: first assisted from elevated bed to [facility] completing a 1/4 pivot turn to her RIGHT with increased time and decreased self WBing thru LEFT LE due to pain.  Heavy support through her B UE's and caution with stand to sit "on this hard seat" BSC.  Then second, assisted off BSC to recliner same fashion.  1/4 pivot turn using hands.General Gait Details: unable to tolerate WBing thriough L LE to take functional steps due to pain/fear of pain/anxiety. Transfers only this session.  ?Pt will need ST Rehab to address her mobility decline and functional Indep as pt was living home alone. ?  ?Recommendations for follow up therapy are one component of a multi-disciplinary discharge planning process, led by the attending physician.  Recommendations may be updated based on  patient status, additional functional criteria and insurance authorization. ? ?Follow Up Recommendations ? Skilled nursing-short term rehab (<3 hours/day) ?  ?  ?Assistance Recommended at Discharge Frequent or constant Supervision/Assistance  ?Patient can return home with the following A little help with walking and/or transfers;A little help with bathing/dressing/bathroom;Assistance with cooking/housework;Assist for transportation;Help with stairs or ramp for entrance ?  ?Equipment Recommendations ? None recommended by PT  ?  ?Recommendations for Other Services   ? ? ?  ?Precautions / Restrictions Precautions ?Precautions: Fall ?Precaution Comments: anxiety/muscle spasms ?Restrictions ?Weight Bearing Restrictions: No ?RLE Weight Bearing: Weight bearing as tolerated ?LLE Weight Bearing: Weight bearing as tolerated  ?  ? ?Mobility ? Bed Mobility ?Overal bed mobility: Needs Assistance ?Bed Mobility: Supine to Sit ?  ?  ?Supine to sit: Min assist, HOB elevated ?  ?  ?General bed mobility comments: Required increased time and use of pad to complete scooting to EOB which was most difficult for her due to pelvic Fx.  "Just so you know, I will scream".  Assisted slowly and with caution, pt did not scream. ?  ? ?Transfers ?Overall transfer level: Needs assistance ?  ?Transfers: Bed to chair/wheelchair/BSC ?Sit to Stand: Min assist ?Stand pivot transfers: Min assist ?  ?  ?  ?  ?General transfer comment: first assisted from elevated bed to [facility] completing a 1/4 pivot turn to her RIGHT with increased time and decreased self WBing thru LEFT LE due to pain.  Heavy support through her B  UE's and caution with stand to sit "on this hard seat" BSC.  Then second, assisted off BSC to recliner same fashion.  1/4 pivot turn using hands. ?  ? ?Ambulation/Gait ?  ?  ?  ?  ?  ?  ?  ?General Gait Details: unable to tolerate WBing thriough L LE to take functional steps due to pain/fear of pain/anxiety. Transfers only this  session. ? ? ?Stairs ?  ?  ?  ?  ?  ? ? ?Wheelchair Mobility ?  ? ?Modified Rankin (Stroke Patients Only) ?  ? ? ?  ?Balance   ?  ?  ?  ?  ?  ?  ?  ?  ?  ?  ?  ?  ?  ?  ?  ?  ?  ?  ?  ? ?  ?Cognition Arousal/Alertness: Awake/alert ?Behavior During Therapy: [facility] for tasks assessed/performed ?Overall Cognitive Status: Within Functional Limits for tasks assessed ?  ?  ?  ?  ?  ?  ?  ?  ?  ?  ?  ?  ?  ?  ?  ?  ?General Comments: AxO x 3 but admits to screaming when "the pain hits".  Anxiety.  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 ?  ?  ? ?  ?Exercises   ? ?  ?General Comments   ?  ?  ? ?Pertinent Vitals/Pain Pain Assessment ?Pain Assessment: Faces ?Faces Pain Scale: Hurts even more ?Pain Location: Rib and LLE with movement ?Pain Descriptors / Indicators: Grimacing, Sharp, Shooting, Guarding ?Pain Intervention(s): Monitored during session, Premedicated before session, Repositioned  ? ? ?Home Living   ?  ?  ?  ?  ?  ?  ?  ?  ?  ?   ?  ?Prior Function    ?  ?  ?   ? ?PT Goals (current goals can now be found in the care plan section) Progress towards PT goals: Progressing toward goals ? ?  ?Frequency ? ? ? Min 2X/week ? ? ? ?  ?PT Plan Current plan remains appropriate  ? ? ?Co-evaluation   ?  ?  ?  ?  ? ?  ?AM-PAC PT "6 Clicks" Mobility   ?Outcome Measure ? Help needed turning from your back to your side while in a flat bed without using bedrails?: A Lot ?Help needed moving from lying on your back to sitting on the side of a flat bed without using bedrails?: A Lot ?Help needed moving to and from a bed to a chair (including a wheelchair)?: A Lot ?Help needed standing up from a chair using your arms (e.g., wheelchair or bedside chair)?: A Lot ?Help needed to walk in hospital room?: Total ?Help needed climbing 3-5 steps with a railing? : Total ?6 Click Score: 10 ? ?  ?End of Session Equipment Utilized During Treatment: Gait belt ?Activity Tolerance: Patient limited by pain ?Patient  left: in chair;with call bell/phone within reach ?Nurse Communication: Mobility status ?PT Visit Diagnosis: Unsteadiness on feet (R26.81);Muscle weakness (generalized) (M62.81);History of falling (Z91.81);Pain;Difficulty in walking, not elsewhere classified (R26.2) ?Pain - Right/Left: Left ?Pain - part of body:  (buttock/pelvic) ?  ? ? ?Time: [id number] ?PT Time Calculation (min) (ACUTE ONLY): 24 min ? ?Charges:  $Therapeutic Activity: 23-37 mins          ?          ? ?Rica Koyanagi  PTA ?Acute  Rehabilitation  Services ?Pager      [phone] ?Office      [phone] ? ?

## 2021-05-19 NOTE — Plan of Care (Signed)
  Problem: Pain Managment: Goal: General experience of comfort will improve Outcome: Progressing   Problem: Safety: Goal: Ability to remain free from injury will improve Outcome: Progressing   Problem: Skin Integrity: Goal: Risk for impaired skin integrity will decrease Outcome: Progressing   

## 2021-05-19 NOTE — TOC Progression Note (Signed)
Transition of Care (TOC) - Progression Note  ? ? ?Patient Details  ?Name: Jasmine Benson ?MRN: [id number] ?Date of Birth: 1945-08-31 ? ?Transition of Care (TOC) CM/SW Contact  ?Lanier Clam, RN ?Phone Number: ?05/19/2021, 9:33 AM ? ?Clinical Narrative:  Non [employer] rep Tresa Endo has started auth-awaiting auth from facility.MD updated.  ? ? ? ?Expected Discharge Plan: Skilled Nursing Facility ?Barriers to Discharge: English as a second language teacher, Continued Medical Work up ? ?Expected Discharge Plan and Services ?Expected Discharge Plan: Skilled Nursing Facility ?  ?  ?  ?  ?                ?  ?  ?  ?  ?  ?  ?  ?  ?  ?  ? ? ?Social Determinants of Health (SDOH) Interventions ?  ? ?Readmission Risk Interventions ?   ? View : No data to display.  ?  ?  ?  ? ? ?

## 2021-05-20 DIAGNOSIS — R509 Fever, unspecified: Secondary | ICD-10-CM | POA: Diagnosis not present

## 2021-05-20 DIAGNOSIS — E876 Hypokalemia: Secondary | ICD-10-CM | POA: Diagnosis not present

## 2021-05-20 DIAGNOSIS — R7989 Other specified abnormal findings of blood chemistry: Secondary | ICD-10-CM | POA: Diagnosis not present

## 2021-05-20 DIAGNOSIS — S32592A Other specified fracture of left pubis, initial encounter for closed fracture: Secondary | ICD-10-CM | POA: Diagnosis not present

## 2021-05-20 MED ORDER — MORPHINE SULFATE (PF) 2 MG/ML IV SOLN: 2.0000 mg | INTRAVENOUS | Status: DC | PRN

## 2021-05-20 MED ORDER — LIDOCAINE 5 % EX PTCH
1.0000 | MEDICATED_PATCH | CUTANEOUS | Status: DC
Start: 2021-05-20 — End: 2021-05-22
  Administered 2021-05-20 – 2021-05-21 (×2): 1 via TRANSDERMAL
  Filled 2021-05-20 (×2): qty 1

## 2021-05-20 MED ORDER — IBUPROFEN 200 MG PO TABS
400.0000 mg | ORAL_TABLET | Freq: Four times a day (QID) | ORAL | Status: DC | PRN
  Administered 2021-05-21: 400 mg via ORAL
  Filled 2021-05-20 (×2): qty 2

## 2021-05-20 MED ORDER — PANTOPRAZOLE SODIUM 40 MG PO TBEC
40.0000 mg | DELAYED_RELEASE_TABLET | Freq: Every day | ORAL | Status: DC
  Administered 2021-05-20 – 2021-05-21 (×2): 40 mg via ORAL
  Filled 2021-05-20 (×2): qty 1

## 2021-05-20 NOTE — Hospital Course (Addendum)
Patient is a 76 y.o. female with past medical history of right hip fracture presented to hospital with fall and left hip and rib pain 1 day prior to presentation.  In the ED, patient was noted to have left 10th rib fracture and acute left superior and inferior pubic rami fracture.  Patient had difficulty ambulating because of this.  Orthopedics was consulted who recommended conservative treatment.  Physical therapy was consulted who recommended skilled nursing facility on discharge.   ? ?Assessment & Plan: ?  ?Mechanical fall ?Intractable pain ?Acute left superior and inferior pubic rami fractures ?Left 10th rib fracture ?Sacral fracture ?Orthopedics Dr. Dion Saucier had seen the patient and recommended conservative treatment.   CT scan of the right hip showed a sacral fracture but no fracture of the hip arthroplasty region.  Physical therapy has recommended skilled nursing facility placement at this time. Continue Lidoderm patch to the left chest wall, Motrin and Protonix., K-pad for local heat compression.  continue muscle relaxant/ hydrocodone.  improved pain today. ?  ?Fever ?Has resolved.  Was present during hospitalization.  Afebrile over the last 48 hours.  UA, chest x-ray unremarkable.  Urine culture blood cultures negative in 3 days. ?  ?Hyponatremia ?Resolved.  Sodium of 136 ?  ?Hypokalemia ?Resolved.  Latest potassium of 4.5. ?  ?Normocytic anemia/macrocytosis ?Will need outpatient follow-up ?  ?Mildly elevated AST ?-Resolved ?

## 2021-05-20 NOTE — TOC Progression Note (Signed)
Transition of Care (TOC) - Progression Note  ? ? ?Patient Details  ?Name: [name] ?MRN: [id number] ?Date of Birth: 02/26/1945 ? ?Transition of Care (TOC) CM/SW Contact  ?Dessa Phi, RN ?Phone Number: ?05/20/2021, 9:10 AM ? ?Clinical Narrative:    Non [employer] rep Claiborne Billings has started auth-awaiting auth from facility.MD updated. ? ? ? ?Expected Discharge Plan: [place] ?Barriers to Discharge: Ship broker, Continued Medical Work up ? ?Expected Discharge Plan and Services ?Expected Discharge Plan: [place] ?  ?  ?  ?  ?                ?  ?  ?  ?  ?  ?  ?  ?  ?  ?  ? ? ?Social Determinants of Health (SDOH) Interventions ?  ? ?Readmission Risk Interventions ?   ? View : No data to display.  ?  ?  ?  ? ? ?

## 2021-05-20 NOTE — Progress Notes (Signed)
?PROGRESS NOTE ? ? ? ?Jasmine Benson  [id number] DOB: [birth year] DOA: 05/15/2021 ?PCP: [facility] [place]  ? ? ?Brief Narrative:  ?Patient is a 76 y.o. female with past medical history of right hip fracture presented to hospital with fall and left hip and rib pain 1 day prior to presentation.  In the ED, patient was noted to have left 10th rib fracture and acute left superior and inferior pubic rami fracture.  Patient had difficulty ambulating because of this.  Orthopedics was consulted who recommended conservative treatment.  Physical therapy was consulted who recommended skilled nursing facility on discharge.   ? ?Assessment & Plan: ?  ?Mechanical fall ?Intractable pain ?Acute left superior and inferior pubic rami fractures ?Left 10th rib fracture ?Sacral fracture ?Orthopedics/Dr. Mardelle Matte had seen the patient and recommended conservative treatment.  Recommending conservative management.  CT scan of the right hip showed a sacral fracture but no fracture of the hip arthroplasty region.  Physical therapy has recommended skilled nursing facility placement at this time.  Patient complains of severe pain and will add Lidoderm patch to the left chest wall, add Motrin and Protonix.  Also add K-pad for local heat compression.  Patient is already on muscle relaxant hydrocodone.  Also getting morphine for pain relief but discouraged use of it for ?  ?Fever ?Has resolved.  Was present during hospitalization.  Afebrile over the last 48 hours.  UA chest x-ray unremarkable.  Urine culture blood cultures negative in 2 days. ?  ?Hyponatremia ?Sodium of 138 3 days back. ?  ?Hypokalemia ?Potassium of 3.8, 3 days back. ?  ?Normocytic anemia/macrocytosis ?Will need outpatient follow-up ?  ?Mildly elevated AST ? ?-Resolved  ? ? ? DVT prophylaxis: enoxaparin (LOVENOX) injection 40 mg Start: 05/15/21 1800 ? ? ?Code Status:   ?  Code Status: Full Code ? ?Disposition: Skilled nursing facility ?Status is:  Inpatient ?Remains inpatient appropriate because: Pain management, awaiting skilled nursing facility  ? ? Family Communication: Spoke with the patient at bedside ? ?Consultants:  ?Orthopedics ? ?Procedures:  ?None ? ?Antimicrobials:  ?None ? ?Anti-infectives (From admission, onward)  ? ? None  ? ?  ? ? ? ?Subjective: ?Today, patient was seen and examined at bedside.  Patient complains of pain over the left chest and pelvic area especially on exertion.  Spoke about pain management.  Did some activity with physical therapy ? ?Objective: ?Vitals:  ? 05/19/21 1213 05/19/21 2026 05/20/21 0437 05/20/21 0442  ?BP: (!) 105/59 101/61 105/61   ?Pulse: 98 95 88   ?Resp:  20 16   ?Temp: 97.9 ?F (36.6 ?C) 98.5 ?F (36.9 ?C) 98.8 ?F (37.1 ?C)   ?TempSrc: Oral Oral Oral   ?SpO2: 91% 96% 93%   ?Weight:    67 kg  ?Height:      ? ? ?Intake/Output Summary (Last 24 hours) at 05/20/2021 1111 ?Last data filed at 05/20/2021 0435 ?Gross per 24 hour  ?Intake --  ?Output 1800 ml  ?Net -1800 ml  ? ?Filed Weights  ? 05/17/21 0500 05/18/21 0500 05/20/21 0442  ?Weight: 68.8 kg 69.1 kg 67 kg  ? ?Body mass index is 24.58 kg/m?Marland Kitchen  ?Physical Examination: ? ?General:  Average built, not in obvious distress ?HENT:   No scleral pallor or icterus noted. Oral mucosa is moist.  ?Chest:  Clear breath sounds.  Diminished breath sounds bilaterally. No crackles or wheezes.  Tenderness over the left chest wall. ?CVS: S1 &S2 heard. No murmur.  Regular rate and  rhythm. ?Abdomen: Soft, nontender, nondistended.  Bowel sounds are heard.   ?Extremities: No cyanosis, clubbing trace lower extremity edema peripheral pulses are palpable.  Tenderness over the left hip area. ?Psych: Alert, awake and oriented, normal mood ?CNS:  No cranial nerve deficits.  Power equal in all extremities.   ?Skin: Warm and dry.   ? ?Data Reviewed:  ? ?CBC: ?Recent Labs  ?Lab 05/15/21 ?1237 05/16/21 ?0500 05/17/21 ?0450  ?WBC 6.7 5.5 5.0  ?NEUTROABS 5.1 3.8 2.8  ?HGB 12.7 11.4* 11.4*  ?HCT 37.1  34.5* 35.1*  ?MCV 109.1* 112.4* 111.8*  ?PLT 234 197 209  ? ? ?Basic Metabolic Panel: ?Recent Labs  ?Lab 05/15/21 ?1237 05/16/21 ?0500 05/17/21 ?0450  ?NA 134* 134* 138  ?K 4.6 3.2* 3.8  ?CL 101 103 105  ?CO2 25 24 25   ?GLUCOSE 124* 97 81  ?BUN 18 13 14   ?CREATININE 0.72 0.64 0.60  ?CALCIUM 9.2 8.7* 8.9  ?MG  --  1.9 1.9  ? ? ?Liver Function Tests: ?Recent Labs  ?Lab 05/15/21 ?1237 05/17/21 ?0450  ?AST 51* 23  ?ALT 37 21  ?ALKPHOS 57 48  ?BILITOT 1.7* 0.7  ?PROT 7.8 6.2*  ?ALBUMIN 4.0 3.0*  ? ? ? ?Radiology Studies: ?No results found. ? ? ? LOS: 3 days  ? ? ?Flora Lipps, MD ?[facility] ?05/20/2021, 11:11 AM  ?  ?

## 2021-05-20 NOTE — Progress Notes (Signed)
Occupational Therapy Treatment ?Patient Details ?Name: Jasmine Benson ?MRN: [id number] ?DOB: 1945/06/28 ?Today's Date: 05/20/2021 ? ? ?History of present illness Jasmine Benson is a 76 y.o. female with history of previous right hip fracture status post hemiarthroplasty presenting to the ED yesterday after a fall.  She fell on her left side on the evening of 05/14/2021.  She slipped on her kitchen floor.  At the time of the fall she had severe left leg and left rib cage pain.  She presented to the emergency department on 05/15/2021.  X-rays were taken as well as CT of the left hip which showed simple left superior and inferior pubic rami fractures rib fracture.. ?  ?OT comments ? Patient self able to sit upright to edge of bed stating "you can't touch me," with bed height elevated and use of bed rail. With increased time patient was self able to power up to standing at edge of bed with walker and take few steps to recliner chair. Attempted to educate/encourage patient to participate in further ADL activity, stand at sink side, ect however patient states "it's too early." Acute OT to follow, continue to recommend rehab at D/C.    ? ?Recommendations for follow up therapy are one component of a multi-disciplinary discharge planning process, led by the attending physician.  Recommendations may be updated based on patient status, additional functional criteria and insurance authorization. ?   ?Follow Up Recommendations ? Skilled nursing-short term rehab (<3 hours/day)  ?  ?Assistance Recommended at Discharge Frequent or constant Supervision/Assistance  ?Patient can return home with the following ? A little help with walking and/or transfers;A lot of help with bathing/dressing/bathroom;Assistance with cooking/housework;Help with stairs or ramp for entrance;Assist for transportation ?  ?Equipment Recommendations ? BSC/3in1  ?  ?   ?Precautions / Restrictions Precautions ?Precautions: Fall ?Precaution Comments:  anxiety/muscle spasms ?Restrictions ?RLE Weight Bearing: Weight bearing as tolerated ?LLE Weight Bearing: Weight bearing as tolerated  ? ? ?  ? ?Mobility Bed Mobility ?Overal bed mobility: Modified Independent ?  ?  ?  ?  ?  ?  ?General bed mobility comments: Patient stating "you can't touch me" and was self able to bring legs to edge of bed and use bed rail to assist with trunk ?  ? ? ?  ?Balance Overall balance assessment: Needs assistance, History of Falls ?Sitting-balance support: Feet supported ?Sitting balance-Leahy Scale: Fair ?  ?  ?Standing balance support: Bilateral upper extremity supported, Reliant on assistive device for balance ?Standing balance-Leahy Scale: Poor ?  ?  ?  ?  ?  ?  ?  ?  ?  ?  ?  ?  ?   ? ?ADL either performed or assessed with clinical judgement  ? ?ADL Overall ADL's : Needs assistance/impaired ?  ?  ?Grooming: Therapist, nutritional;Wash/dry hands;Set up;Sitting ?  ?  ?  ?  ?  ?  ?  ?  ?  ?Toilet Transfer: Supervision/safety;Stand-pivot;Rolling walker (2 wheels) ?Toilet Transfer Details (indicate cue type and reason): Patient stating "you can't touch me" and was self able to power up to standing from edge of bed and pivot to recliner chair with walker ?  ?  ?  ?  ?Functional mobility during ADLs: Supervision/safety;Rolling walker (2 wheels) ?General ADL Comments: Attempt to educate/encourage patient to participate in further ADL activity such as brushing her hair or getting to sink for grooming/hygiene however patient refuses "its too early for all that" ?  ? ? ? ?Cognition Arousal/Alertness: Awake/alert ?  Behavior During Therapy: [facility] for tasks assessed/performed ?Overall Cognitive Status: Within Functional Limits for tasks assessed ?  ?  ?  ?  ?  ?  ?  ?  ?  ?  ?  ?  ?  ?  ?  ?  ?  ?  ?  ?   ?   ?   ?   ? ? ?Pertinent Vitals/ Pain       Pain Assessment ?Pain Assessment: Faces ?Faces Pain Scale: Hurts even more ?Pain Location: L LE with movement ?Pain Descriptors / Indicators: Moaning ?Pain  Intervention(s): Premedicated before session ? ?   ?   ? ?Frequency ? Min 2X/week  ? ? ? ? ?  ?Progress Toward Goals ? ?OT Goals(current goals can now be found in the care plan section) ? Progress towards OT goals: Progressing toward goals ? ?Acute Rehab OT Goals ?Patient Stated Goal: Go to rehab ?OT Goal Formulation: With patient ?Time For Goal Achievement: 05/30/21 ?Potential to Achieve Goals: Good ?ADL Goals ?Pt Will Perform Lower Body Dressing: with min guard assist;sit to/from stand;with adaptive equipment ?Pt Will Transfer to Toilet: with min guard assist;regular height toilet;grab bars ?Pt Will Perform Toileting - Clothing Manipulation and hygiene: with min guard assist;sit to/from stand ?Additional ADL Goal #1: Patient will stand at sink to perform grooming task as evidence of improving activity tolerance  ?Plan Discharge plan remains appropriate   ? ?   ?AM-PAC OT "6 Clicks" Daily Activity     ?Outcome Measure ? ? Help from another person eating meals?: None ?Help from another person taking care of personal grooming?: A Little ?Help from another person toileting, which includes using toliet, bedpan, or urinal?: A Lot ?Help from another person bathing (including washing, rinsing, drying)?: A Lot ?Help from another person to put on and taking off regular upper body clothing?: A Little ?Help from another person to put on and taking off regular lower body clothing?: A Lot ?6 Click Score: 16 ? ?  ?End of Session Equipment Utilized During Treatment: Rolling walker (2 wheels) ? ?OT Visit Diagnosis: Pain ?Pain - Right/Left: Left ?Pain - part of body: Leg;Hip ?  ?Activity Tolerance Patient limited by pain ?  ?Patient Left with call bell/phone within reach;in chair;with chair alarm set ?  ?Nurse Communication Mobility status ?  ? ?   ? ?Time: 4492-0100 ?OT Time Calculation (min): 15 min ? ?Charges: OT General Charges ?$OT Visit: 1 Visit ?OT Treatments ?$Self Care/Home Management : 8-22 mins ? ?Marlyce Huge OT ?OT  pager: [phone] ? ? ?Carmelia Roller ?05/20/2021, 9:46 AM ?

## 2021-05-21 DIAGNOSIS — Z7401 Bed confinement status: Secondary | ICD-10-CM | POA: Diagnosis not present

## 2021-05-21 DIAGNOSIS — S2232XA Fracture of one rib, left side, initial encounter for closed fracture: Secondary | ICD-10-CM | POA: Diagnosis not present

## 2021-05-21 DIAGNOSIS — S32592A Other specified fracture of left pubis, initial encounter for closed fracture: Secondary | ICD-10-CM | POA: Diagnosis not present

## 2021-05-21 DIAGNOSIS — R0782 Intercostal pain: Secondary | ICD-10-CM | POA: Diagnosis not present

## 2021-05-21 DIAGNOSIS — D7589 Other specified diseases of blood and blood-forming organs: Secondary | ICD-10-CM | POA: Diagnosis not present

## 2021-05-21 DIAGNOSIS — S32502D Unspecified fracture of left pubis, subsequent encounter for fracture with routine healing: Secondary | ICD-10-CM | POA: Diagnosis not present

## 2021-05-21 DIAGNOSIS — D649 Anemia, unspecified: Secondary | ICD-10-CM | POA: Diagnosis not present

## 2021-05-21 DIAGNOSIS — R52 Pain, unspecified: Secondary | ICD-10-CM | POA: Diagnosis not present

## 2021-05-21 DIAGNOSIS — R509 Fever, unspecified: Secondary | ICD-10-CM | POA: Diagnosis not present

## 2021-05-21 DIAGNOSIS — E875 Hyperkalemia: Secondary | ICD-10-CM | POA: Diagnosis not present

## 2021-05-21 DIAGNOSIS — F32A Depression, unspecified: Secondary | ICD-10-CM | POA: Diagnosis not present

## 2021-05-21 DIAGNOSIS — S20312A Abrasion of left front wall of thorax, initial encounter: Secondary | ICD-10-CM | POA: Diagnosis not present

## 2021-05-21 DIAGNOSIS — S32599S Other specified fracture of unspecified pubis, sequela: Secondary | ICD-10-CM | POA: Diagnosis not present

## 2021-05-21 DIAGNOSIS — R262 Difficulty in walking, not elsewhere classified: Secondary | ICD-10-CM | POA: Diagnosis not present

## 2021-05-21 DIAGNOSIS — Z9181 History of falling: Secondary | ICD-10-CM | POA: Diagnosis not present

## 2021-05-21 DIAGNOSIS — S3210XA Unspecified fracture of sacrum, initial encounter for closed fracture: Secondary | ICD-10-CM | POA: Diagnosis not present

## 2021-05-21 DIAGNOSIS — R531 Weakness: Secondary | ICD-10-CM | POA: Diagnosis not present

## 2021-05-21 DIAGNOSIS — S32599A Other specified fracture of unspecified pubis, initial encounter for closed fracture: Secondary | ICD-10-CM | POA: Diagnosis not present

## 2021-05-21 DIAGNOSIS — E871 Hypo-osmolality and hyponatremia: Secondary | ICD-10-CM | POA: Diagnosis not present

## 2021-05-21 DIAGNOSIS — S2239XA Fracture of one rib, unspecified side, initial encounter for closed fracture: Secondary | ICD-10-CM | POA: Diagnosis not present

## 2021-05-21 DIAGNOSIS — R600 Localized edema: Secondary | ICD-10-CM | POA: Diagnosis not present

## 2021-05-21 DIAGNOSIS — R7989 Other specified abnormal findings of blood chemistry: Secondary | ICD-10-CM | POA: Diagnosis not present

## 2021-05-21 DIAGNOSIS — E876 Hypokalemia: Secondary | ICD-10-CM | POA: Diagnosis not present

## 2021-05-21 DIAGNOSIS — S2232XD Fracture of one rib, left side, subsequent encounter for fracture with routine healing: Secondary | ICD-10-CM | POA: Diagnosis not present

## 2021-05-21 LAB — MAGNESIUM: Magnesium: 1.8 mg/dL (ref 1.7–2.4)

## 2021-05-21 LAB — CBC
HCT: 31.5 % — ABNORMAL LOW (ref 36.0–46.0)
Hemoglobin: 10.2 g/dL — ABNORMAL LOW (ref 12.0–15.0)
MCH: 36.3 pg — ABNORMAL HIGH (ref 26.0–34.0)
MCHC: 32.4 g/dL (ref 30.0–36.0)
MCV: 112.1 fL — ABNORMAL HIGH (ref 80.0–100.0)
Platelets: 236 10*3/uL (ref 150–400)
RBC: 2.81 MIL/uL — ABNORMAL LOW (ref 3.87–5.11)
RDW: 12.2 % (ref 11.5–15.5)
WBC: 4.4 10*3/uL (ref 4.0–10.5)
nRBC: 0 % (ref 0.0–0.2)

## 2021-05-21 LAB — BASIC METABOLIC PANEL
Anion gap: 5 (ref 5–15)
BUN: 24 mg/dL — ABNORMAL HIGH (ref 8–23)
CO2: 26 mmol/L (ref 22–32)
Calcium: 9.2 mg/dL (ref 8.9–10.3)
Chloride: 105 mmol/L (ref 98–111)
Creatinine, Ser: 0.68 mg/dL (ref 0.44–1.00)
GFR, Estimated: >60 mL/min (ref >60)
Glucose, Bld: 96 mg/dL (ref 70–99)
Potassium: 4.5 mmol/L (ref 3.5–5.1)
Sodium: 136 mmol/L (ref 135–145)

## 2021-05-21 MED ORDER — METHOCARBAMOL 1000 MG PO TABS
1000.0000 mg | ORAL_TABLET | Freq: Three times a day (TID) | ORAL | Status: AC | PRN
Start: 2021-05-21 — End: 2021-05-26

## 2021-05-21 MED ORDER — IBUPROFEN 400 MG PO TABS: 400.0000 mg | ORAL_TABLET | Freq: Four times a day (QID) | ORAL | 0 refills | Status: DC | PRN

## 2021-05-21 MED ORDER — PANTOPRAZOLE SODIUM 40 MG PO TBEC: 40.0000 mg | DELAYED_RELEASE_TABLET | Freq: Every day | ORAL | 0 refills | Status: DC

## 2021-05-21 MED ORDER — ONDANSETRON HCL 4 MG PO TABS: 4.0000 mg | ORAL_TABLET | Freq: Four times a day (QID) | ORAL | 0 refills | Status: DC | PRN

## 2021-05-21 MED ORDER — LIDOCAINE 5 % EX PTCH: 1.0000 | MEDICATED_PATCH | CUTANEOUS | 0 refills | Status: DC

## 2021-05-21 MED ORDER — HYDROCODONE-ACETAMINOPHEN 5-325 MG PO TABS: 1.0000 | ORAL_TABLET | ORAL | 0 refills | Status: AC | PRN

## 2021-05-21 MED ORDER — SENNOSIDES-DOCUSATE SODIUM 8.6-50 MG PO TABS: 1.0000 | ORAL_TABLET | Freq: Every evening | ORAL | Status: AC | PRN

## 2021-05-21 NOTE — Progress Notes (Signed)
?PROGRESS NOTE ? ? ? ?Jasmine Benson  IZT:[id number] DOB: June 10, 1945 DOA: 05/15/2021 ?PCP: [facility] [place]  ? ? ?Brief Narrative:  ?Patient is a 76 y.o. female with past medical history of right hip fracture presented to hospital with fall and left hip and rib pain 1 day prior to presentation.  In the ED, patient was noted to have left 10th rib fracture and acute left superior and inferior pubic rami fracture.  Patient had difficulty ambulating because of this.  Orthopedics was consulted who recommended conservative treatment.  Physical therapy was consulted who recommended skilled nursing facility on discharge.   ? ?Assessment & Plan: ?  ?Mechanical fall ?Intractable pain ?Acute left superior and inferior pubic rami fractures ?Left 10th rib fracture ?Sacral fracture ?Orthopedics Dr. Dion Saucier had seen the patient and recommended conservative treatment.   CT scan of the right hip showed a sacral fracture but no fracture of the hip arthroplasty region.  Physical therapy has recommended skilled nursing facility placement at this time. Continue Lidoderm patch to the left chest wall, Motrin and Protonix., K-pad for local heat compression.  continue muscle relaxant/ hydrocodone.  improved pain today. ?  ?Fever ?Has resolved.  Was present during hospitalization.  Afebrile over the last 48 hours.  UA, chest x-ray unremarkable.  Urine culture blood cultures negative in 3 days. ?  ?Hyponatremia ?Resolved.  Sodium of 136 ?  ?Hypokalemia ?Resolved.  Latest potassium of 4.5. ?  ?Normocytic anemia/macrocytosis ?Will need outpatient follow-up ?  ?Mildly elevated AST ?-Resolved  ? ? ? DVT prophylaxis: enoxaparin (LOVENOX) injection 40 mg Start: 05/15/21 1800 ? ? ?Code Status:   ?  Code Status: Full Code ? ?Disposition: Skilled nursing facility when bed is available.  Medically stable for disposition ? ?Status is: Inpatient ? ?Remains inpatient appropriate because: Pain management, awaiting skilled  nursing facility  ? ? Family Communication:  ?Spoke with the patient at bedside. I also spoke with the patient's sister on the phone and updated her about the clinical condition of the patient. ? ?Consultants:  ?Orthopedics ? ?Procedures:  ?None ? ?Antimicrobials:  ?None ? ?Anti-infectives (From admission, onward)  ? ? None  ? ?  ? ? ?Subjective: ?Today, patient was seen and examined.  Patient states that her pain is little better today with medications. ? ?Objective: ?Vitals:  ? 05/20/21 0442 05/20/21 1231 05/20/21 2210 05/21/21 0443  ?BP:  (!) 126/97 (!) 108/56 115/63  ?Pulse:  95 82 73  ?Resp:  20 18 18   ?Temp:  97.7 ?F (36.5 ?C) 97.9 ?F (36.6 ?C) 97.8 ?F (36.6 ?C)  ?TempSrc:  Oral Oral   ?SpO2:  99% 100% 93%  ?Weight: 67 kg     ?Height:      ? ? ?Intake/Output Summary (Last 24 hours) at 05/21/2021 1205 ?Last data filed at 05/21/2021 05/23/2021 ?Gross per 24 hour  ?Intake --  ?Output 1100 ml  ?Net -1100 ml  ? ?Filed Weights  ? 05/17/21 0500 05/18/21 0500 05/20/21 0442  ?Weight: 68.8 kg 69.1 kg 67 kg  ? ?Body mass index is 24.58 kg/m?05/22/21  ?Physical Examination: ? ?General:  Average built, not in obvious distress ?HENT:   No scleral pallor or icterus noted. Oral mucosa is moist.  ?Chest:  Clear breath sounds.  Diminished breath sounds bilaterally. No crackles or wheezes.  ?CVS: S1 &S2 heard. No murmur.  Regular rate and rhythm. ?Abdomen: Soft, nontender, nondistended.  Bowel sounds are heard.   ?Extremities: No cyanosis, clubbing with trace lower  extremity edema, tenderness over the left hip peripheral pulses are palpable. ?Psych: Alert, awake and oriented, normal mood ?CNS:  No cranial nerve deficits.  Power equal in all extremities.   ?Skin: Warm and dry.  No rashes noted. ? ? ?Data Reviewed:  ? ?CBC: ?Recent Labs  ?Lab 05/15/21 ?1237 05/16/21 ?0500 05/17/21 ?0450 05/21/21 ?0515  ?WBC 6.7 5.5 5.0 4.4  ?NEUTROABS 5.1 3.8 2.8  --   ?HGB 12.7 11.4* 11.4* 10.2*  ?HCT 37.1 34.5* 35.1* 31.5*  ?MCV 109.1* 112.4* 111.8* 112.1*   ?PLT 234 197 209 236  ? ? ?Basic Metabolic Panel: ?Recent Labs  ?Lab 05/15/21 ?1237 05/16/21 ?0500 05/17/21 ?0450 05/21/21 ?0515  ?NA 134* 134* 138 136  ?K 4.6 3.2* 3.8 4.5  ?CL 101 103 105 105  ?CO2 25 24 25 26   ?GLUCOSE 124* 97 81 96  ?BUN 18 13 14  24*  ?CREATININE 0.72 0.64 0.60 0.68  ?CALCIUM 9.2 8.7* 8.9 9.2  ?MG  --  1.9 1.9 1.8  ? ? ?Liver Function Tests: ?Recent Labs  ?Lab 05/15/21 ?1237 05/17/21 ?0450  ?AST 51* 23  ?ALT 37 21  ?ALKPHOS 57 48  ?BILITOT 1.7* 0.7  ?PROT 7.8 6.2*  ?ALBUMIN 4.0 3.0*  ? ? ? ?Radiology Studies: ?No results found. ? ? ? LOS: 4 days  ? ? ?05/17/21, MD ?[facility] ?05/21/2021, 12:05 PM  ?  ?

## 2021-05-21 NOTE — Progress Notes (Signed)
Physical Therapy Treatment ?Patient Details ?Name: Jasmine Benson ?MRN: [id number] ?DOB: 12-Jul-1945 ?Today's Date: 05/21/2021 ? ? ?History of Present Illness Jasmine Benson is a 76 y.o. female with history of previous right hip fracture status post hemiarthroplasty presenting to the ED yesterday after a fall.  She fell on her left side on the evening of 05/14/2021.  She slipped on her kitchen floor.  At the time of the fall she had severe left leg and left rib cage pain.  She presented to the emergency department on 05/15/2021.  X-rays were taken as well as CT of the left hip which showed simple left superior and inferior pubic rami fractures rib fracture.. ? ?  ?PT Comments  ? ? General Comments: AxO x 3 but admits to screaming when "the pain hits".  Anxiety.  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 Assisted OOB required increased time and effort.  General bed mobility comments: required increased tima and Min Assist to use bed pad to complete swival/scooting to EOB.  Limited by pain. General transfer comment: increased self ability to rise from elevated bed with much effort and caution to avoid increased pain/spasms.  Poor forward flex posture and excessive WBing thru walker.  50% VC's to reach back prior to sit to control desend. General Gait Details: pt was able to tolerate amb 7 feet with walker with much effort.  Reported 9/10 pain with WBing thru R LE. ?Pt lives home alone and was Indep.  Pt will need ST Rehab at SNF prior to safely returning home.   ?Recommendations for follow up therapy are one component of a multi-disciplinary discharge planning process, led by the attending physician.  Recommendations may be updated based on patient status, additional functional criteria and insurance authorization. ? ?Follow Up Recommendations ? Skilled nursing-short term rehab (<3 hours/day) ?  ?  ?Assistance Recommended at Discharge Frequent or constant  Supervision/Assistance  ?Patient can return home with the following A little help with walking and/or transfers;A little help with bathing/dressing/bathroom;Assistance with cooking/housework;Assist for transportation;Help with stairs or ramp for entrance ?  ?Equipment Recommendations ?    ?  ?Recommendations for Other Services   ? ? ?  ?Precautions / Restrictions Precautions ?Precautions: Fall ?Precaution Comments: anxiety/muscle spasms ?Restrictions ?Weight Bearing Restrictions: No ?RLE Weight Bearing: Weight bearing as tolerated ?LLE Weight Bearing: Weight bearing as tolerated  ?  ? ?Mobility ? Bed Mobility ?Overal bed mobility: Needs Assistance ?Bed Mobility: Supine to Sit ?  ?  ?Supine to sit: Min assist, HOB elevated ?  ?  ?General bed mobility comments: required increased tima and Min Assist to use bed pad to complete swival/scooting to EOB.  Limited by pain. ?  ? ?Transfers ?Overall transfer level: Needs assistance ?Equipment used: Rolling walker (2 wheels) ?  ?Sit to Stand: Min assist, Mod assist ?  ?  ?  ?  ?  ?General transfer comment: increased self ability to rise from elevated bed with much effort and caution to avoid increased pain/spasms.  Poor forward flex posture and excessive WBing thru walker.  50% VC's to reach back prior to sit to control desend. ?  ? ?Ambulation/Gait ?Ambulation/Gait assistance: Min assist, Mod assist ?Gait Distance (Feet): 7 Feet ?Assistive device: Rolling walker (2 wheels) ?Gait Pattern/deviations: Step-to pattern, Decreased stance time - right, Trunk flexed, Decreased step length - right, Decreased step length - left ?Gait velocity: decreased ?  ?  ?General Gait Details: pt was able to tolerate  amb 7 feet with walker with much effort.  Reported 9/10 pain with WBing thru R LE. ? ? ?Stairs ?  ?  ?  ?  ?  ? ? ?Wheelchair Mobility ?  ? ?Modified Rankin (Stroke Patients Only) ?  ? ? ?  ?Balance   ?  ?  ?  ?  ?  ?  ?  ?  ?  ?  ?  ?  ?  ?  ?  ?  ?  ?  ?  ? ?  ?Cognition  Arousal/Alertness: Awake/alert ?Behavior During Therapy: [facility] for tasks assessed/performed ?  ?  ?  ?  ?  ?  ?  ?  ?  ?  ?  ?  ?  ?  ?  ?  ?  ?General Comments: AxO x 3 but admits to screaming when "the pain hits".  Anxiety.  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 ?  ?  ? ?  ?Exercises   ? ?  ?General Comments   ?  ?  ? ?Pertinent Vitals/Pain Pain Assessment ?Pain Assessment: Faces ?Faces Pain Scale: Hurts even more ?Pain Location: L LE with movement ?Pain Descriptors / Indicators: Moaning, Radiating, Grimacing ?Pain Intervention(s): Monitored during session, Premedicated before session, Repositioned, Heat applied  ? ? ?Home Living   ?  ?  ?  ?  ?  ?  ?  ?  ?  ?   ?  ?Prior Function    ?  ?  ?   ? ?PT Goals (current goals can now be found in the care plan section) Progress towards PT goals: Progressing toward goals ? ?  ?Frequency ? ? ? Min 2X/week ? ? ? ?  ?PT Plan Current plan remains appropriate  ? ? ?Co-evaluation   ?  ?  ?  ?  ? ?  ?AM-PAC PT "6 Clicks" Mobility   ?Outcome Measure ? Help needed turning from your back to your side while in a flat bed without using bedrails?: A Lot ?Help needed moving from lying on your back to sitting on the side of a flat bed without using bedrails?: A Lot ?Help needed moving to and from a bed to a chair (including a wheelchair)?: A Lot ?Help needed standing up from a chair using your arms (e.g., wheelchair or bedside chair)?: A Lot ?Help needed to walk in hospital room?: A Lot ?Help needed climbing 3-5 steps with a railing? : Total ?6 Click Score: 11 ? ?  ?End of Session Equipment Utilized During Treatment: Gait belt ?Activity Tolerance: Patient limited by pain ?Patient left: in chair;with call bell/phone within reach ?Nurse Communication: Mobility status ?PT Visit Diagnosis: Unsteadiness on feet (R26.81);Muscle weakness (generalized) (M62.81);History of falling (Z91.81);Pain;Difficulty in walking, not elsewhere classified  (R26.2) ?Pain - Right/Left: Right ?Pain - part of body: Hip ?  ? ? ?Time: 6387-5643 ?PT Time Calculation (min) (ACUTE ONLY): 27 min ? ?Charges:  $Gait Training: 8-22 mins ?$Therapeutic Activity: 8-22 mins          ?          ? ?Felecia Shelling  PTA ?Acute  Rehabilitation Services ?Pager      [phone] ?Office      [phone] ? ? ? ?

## 2021-05-21 NOTE — TOC Transition Note (Addendum)
Transition of Care (TOC) - CM/SW Discharge Note ? ? ?Patient Details  ?Name: Jasmine Benson ?MRN: [id number] ?Date of Birth: 09-25-1945 ? ?Transition of Care [facility]) CM/SW Contact:  ?Lanier Clam, RN ?Phone Number: ?05/21/2021, 3:21 PM ? ? ?Clinical Narrative:   [facility] received auth-MD will  do d/c summary so patient can d/c to SNF today.Await rm#,nsg report tel#,then PTAR for transport. No further CM needs. ?-going to rm#[id number],call report tel#[phone]. ? ? ? ?Final next level of care: Skilled Nursing Facility ?Barriers to Discharge: No Barriers Identified ? ? ?Patient Goals and CMS Choice ?Patient states their goals for this hospitalization and ongoing recovery are:: To go to SNF for short term rehab, then return back home. ?CMS Medicare.gov Compare Post Acute Care list provided to:: Patient ?Choice offered to / list presented to : Patient ? ?Discharge Placement ?  ?           ?  ?  ?  ?  ? ?Discharge Plan and Services ?  ?  ?           ?  ?  ?  ?  ?  ?  ?  ?  ?  ?  ? ?Social Determinants of Health (SDOH) Interventions ?  ? ? ?Readmission Risk Interventions ?   ? View : No data to display.  ?  ?  ?  ? ? ? ? ? ?

## 2021-05-21 NOTE — Discharge Summary (Signed)
?Physician Discharge Summary ?  ?Patient: Jasmine Benson MRN: [id number] DOB: 1945-08-01  ?Admit date:     05/15/2021  ?Discharge date: 05/21/21  ?Discharge Physician: Rebekah Chesterfield Camisha Srey  ? ?PCP: [facility] [place]  ? ?Recommendations at discharge:  ? ?Follow-up with your primary care provider at the skilled nursing facility in 3 to 5 days. ? ?Discharge Diagnoses: ?Principal Problem: ?  Pubic ramus fracture (HCC) ?Active Problems: ?  Hyponatremia ?  Macrocytosis ?  Rib fracture ?  Intractable pain ?  Elevated LFTs ?  Hypokalemia ?  Normocytic anemia ?  Fever ?  Sacral fracture (HCC) ? ?Resolved Problems: ?  * No resolved hospital problems. * ? ?Hospital Course: ?Patient is a 76 y.o. female with past medical history of right hip fracture presented to hospital with fall and left hip and rib pain 1 day prior to presentation.  In the ED, patient was noted to have left 10th rib fracture and acute left superior and inferior pubic rami fracture.  Patient had difficulty ambulating because of this.  Orthopedics was consulted who recommended conservative treatment.  Physical therapy was consulted who recommended skilled nursing facility on discharge.   ? ?Assessment & Plan: ?  ?Mechanical fall ?Intractable pain ?Acute left superior and inferior pubic rami fractures ?Left 10th rib fracture ?Sacral fracture ?Orthopedics Dr. Dion Saucier had seen the patient and recommended conservative treatment.   CT scan of the right hip showed a sacral fracture but no fracture of the hip arthroplasty region.  Physical therapy has recommended skilled nursing facility placement at this time. Continue Lidoderm patch to the left chest wall, Motrin and Protonix., K-pad for local heat compression.  continue muscle relaxant/ hydrocodone.  improved pain today. ?  ?Fever ?Has resolved.  Was present during hospitalization.  Afebrile over the last 48 hours.  UA, chest x-ray unremarkable.  Urine culture blood cultures negative in 3  days. ?  ?Hyponatremia ?Resolved.  Sodium of 136 ?  ?Hypokalemia ?Resolved.  Latest potassium of 4.5. ?  ?Normocytic anemia/macrocytosis ?Will need outpatient follow-up ?  ?Mildly elevated AST ?-Resolved ? ?Consultants: Orthopedics ? ?Procedures performed: None ? ?Disposition: Skilled nursing facility ? ?Diet recommendation:  ?Discharge Diet Orders (From admission, onward)  ? ?  Start     Ordered  ? 05/21/21 0000  Diet - low sodium heart healthy       ? 05/21/21 1539  ? ?  ?  ? ?  ? ?Cardiac diet ?DISCHARGE MEDICATION: ?Allergies as of 05/21/2021   ?No Known Allergies ?  ? ?  ?Medication List  ?  ? ?TAKE these medications   ? ?citalopram 20 MG tablet ?Commonly known as: CELEXA ?Take 20 mg by mouth daily. ?  ?ezetimibe 10 MG tablet ?Commonly known as: ZETIA ?Take 10 mg by mouth at bedtime. ?  ?FOLIC ACID PO ?Take 2 capsules by mouth daily. ?  ?HYDROcodone-acetaminophen 5-325 MG tablet ?Commonly known as: NORCO/VICODIN ?Take 1 tablet by mouth every 4 (four) hours as needed for up to 10 days for moderate pain. ?  ?ibuprofen 400 MG tablet ?Commonly known as: ADVIL ?Take 1 tablet (400 mg total) by mouth every 6 (six) hours as needed for mild pain or moderate pain. ?  ?lidocaine 5 % ?Commonly known as: LIDODERM ?Place 1 patch onto the skin daily. Remove & Discard patch within 12 hours or as directed by MD ?Start taking on: May 22, 2021 ?  ?Methocarbamol 1000 MG Tabs ?Take 1,000 mg by mouth every 8 (eight)  hours as needed for up to 5 days for muscle spasms. ?  ?ondansetron 4 MG tablet ?Commonly known as: ZOFRAN ?Take 1 tablet (4 mg total) by mouth every 6 (six) hours as needed for nausea. ?  ?pantoprazole 40 MG tablet ?Commonly known as: PROTONIX ?Take 1 tablet (40 mg total) by mouth daily for 14 days. ?Start taking on: May 22, 2021 ?  ?rosuvastatin 5 MG tablet ?Commonly known as: CRESTOR ?Take 5 mg by mouth 2 (two) times a week. Monday and Friday ?  ?senna-docusate 8.6-50 MG tablet ?Commonly known as: Senokot-S ?Take 1  tablet by mouth at bedtime as needed for mild constipation. ?  ?traZODone 150 MG tablet ?Commonly known as: DESYREL ?Take 150 mg by mouth at bedtime. ?  ?VITAMIN D3 PO ?Take 1 capsule by mouth daily. ?  ? ?  ? ? Contact information for after-discharge care   ? ? Destination   ? ? [facility] .   ?Service: Skilled Nursing ?Contact information: ?[address] ?[place] [phone] ?[phone] ? ?  ?  ? ?  ?  ? ?  ?  ? ?  ? ?Subjective ?Today, patient was seen and examined at bedside.  States that her pain is better on the current regimen. ? ?Discharge Exam: ?Filed Weights  ? 05/17/21 0500 05/18/21 0500 05/20/21 0442  ?Weight: 68.8 kg 69.1 kg 67 kg  ? ? ?  05/21/2021  ?  1:44 PM 05/21/2021  ?  4:43 AM 05/20/2021  ? 10:10 PM  ?Vitals with BMI  ?Systolic 112 115 [id number]  ?Diastolic 73 63 56  ?Pulse 95 73 82  ?  ?General:  Average built, not in obvious distress ?HENT:   No scleral pallor or icterus noted. Oral mucosa is moist.  ?Chest:  Clear breath sounds.  Diminished breath sounds bilaterally. No crackles or wheezes.  Tenderness over the left chest wall. ?CVS: S1 &S2 heard. No murmur.  Regular rate and rhythm. ?Abdomen: Soft, nontender, nondistended.  Bowel sounds are heard.   ?Extremities: No cyanosis, clubbing trace lower extremity edema peripheral pulses are palpable.  Tenderness over the left hip area. ?Psych: Alert, awake and oriented, normal mood ?CNS:  No cranial nerve deficits.  Power equal in all extremities.   ?Skin: Warm and dry.   ? ?Condition at discharge: good ? ?The results of significant diagnostics from this hospitalization (including imaging, microbiology, ancillary and laboratory) are listed below for reference.  ? ?Imaging Studies: ?DG Ribs Unilateral W/Chest Left ? ?Result Date: 05/15/2021 ?CLINICAL DATA:  Fall.  Left lower anterior rib pain. EXAM: LEFT RIBS AND CHEST - 3+ VIEW COMPARISON:  Frontal chest radiographs 11/15/2016 and 05/07/2020 FINDINGS: Cardiac silhouette  and mediastinal contours are within normal limits. The lungs are clear. No pleural effusion or pneumothorax. Lateral partially calcified breast implants are again noted. A marker is seen lateral to the approximate seventh through ninth ribs. There is linear lucency and minimal cortical step-off at the posterolateral aspect of the left tenth rib, an acute to subacute minimally displaced fracture. This is seen on the second and third images of this study. Severe left-greater-than-right glenohumeral joint space narrowing. IMPRESSION:: IMPRESSION: 1. No acute lung process. 2. Minimally displaced acute to subacute fracture of the posterolateral aspect of the left tenth rib. No pneumothorax. Electronically Signed   By: Neita Garnetonald  Viola M.D.   On: 05/15/2021 12:34  ? ?CT HIP LEFT WO CONTRAST ? ?Result Date: 05/15/2021 ?CLINICAL DATA:  Left leg pain after fall last  night. EXAM: CT OF THE LEFT HIP WITHOUT CONTRAST TECHNIQUE: Multidetector CT imaging of the left hip was performed according to the standard protocol. Multiplanar CT image reconstructions were also generated. RADIATION DOSE REDUCTION: This exam was performed according to the departmental dose-optimization program which includes automated exposure control, adjustment of the mA and/or kV according to patient size and/or use of iterative reconstruction technique. COMPARISON:  Left hip x-rays from same day. FINDINGS: Bones/Joint/Cartilage Acute minimally displaced fracture of the left puboacetabular junction (series 3, image 26) and left parasymphyseal superior pubic ramus (series 7, image 25). Acute nondisplaced fracture of the left inferior pubic ramus (series 3, image 50). No dislocation. The left hip joint space is relatively preserved. Mild degenerative changes of the pubic symphysis. No joint effusion. Ligaments Ligaments are suboptimally evaluated by CT. Muscles and Tendons Grossly intact. Soft tissue No fluid collection or hematoma.  No soft tissue mass.  IMPRESSION: 1. Acute fractures of the left superior and inferior pubic rami. Electronically Signed   By: Obie Dredge M.D.   On: 05/15/2021 16:12  ? ?CT HIP RIGHT WO CONTRAST ? ?Result Date: 05/16/2021 ?CLINICAL

## 2021-05-21 NOTE — TOC Progression Note (Signed)
Transition of Care (TOC) - Progression Note  ? ? ?Patient Details  ?Name: Jasmine Benson ?MRN: [id number] ?Date of Birth: 1945/05/09 ? ?Transition of Care (TOC) CM/SW Contact  ?Lanier Clam, RN ?Phone Number: ?05/21/2021, 9:36 AM ? ?Clinical Narrative:  [facility] SN[facility] rep is still awaiting auth. MD updated.  ? ? ? ?Expected Discharge Plan: Skilled Nursing Facility ?Barriers to Discharge: English as a second language teacher, Continued Medical Work up ? ?Expected Discharge Plan and Services ?Expected Discharge Plan: Skilled Nursing Facility ?  ?  ?  ?  ?                ?  ?  ?  ?  ?  ?  ?  ?  ?  ?  ? ? ?Social Determinants of Health (SDOH) Interventions ?  ? ?Readmission Risk Interventions ?   ? View : No data to display.  ?  ?  ?  ? ? ?

## 2021-05-21 NOTE — Progress Notes (Signed)
Called and gave report to [facility] rehab nurse. [facility] notified me that the pt is second on the list to be picked up. ?

## 2021-05-22 DIAGNOSIS — R0782 Intercostal pain: Secondary | ICD-10-CM | POA: Diagnosis not present

## 2021-05-22 DIAGNOSIS — S32599S Other specified fracture of unspecified pubis, sequela: Secondary | ICD-10-CM | POA: Diagnosis not present

## 2021-05-22 LAB — CULTURE, BLOOD (ROUTINE X 2)
Culture: NO GROWTH
Culture: NO GROWTH
Special Requests: ADEQUATE
Special Requests: ADEQUATE

## 2021-05-26 DIAGNOSIS — S2232XD Fracture of one rib, left side, subsequent encounter for fracture with routine healing: Secondary | ICD-10-CM | POA: Diagnosis not present

## 2021-05-26 DIAGNOSIS — Z9181 History of falling: Secondary | ICD-10-CM | POA: Diagnosis not present

## 2021-05-26 DIAGNOSIS — S32502D Unspecified fracture of left pubis, subsequent encounter for fracture with routine healing: Secondary | ICD-10-CM | POA: Diagnosis not present

## 2021-05-26 DIAGNOSIS — R262 Difficulty in walking, not elsewhere classified: Secondary | ICD-10-CM | POA: Diagnosis not present

## 2021-06-10 DIAGNOSIS — S20312A Abrasion of left front wall of thorax, initial encounter: Secondary | ICD-10-CM | POA: Diagnosis not present

## 2021-06-10 DIAGNOSIS — R600 Localized edema: Secondary | ICD-10-CM | POA: Diagnosis not present

## 2021-06-10 DIAGNOSIS — R0782 Intercostal pain: Secondary | ICD-10-CM | POA: Diagnosis not present

## 2021-06-15 DIAGNOSIS — R2689 Other abnormalities of gait and mobility: Secondary | ICD-10-CM | POA: Diagnosis not present

## 2021-06-15 DIAGNOSIS — M6281 Muscle weakness (generalized): Secondary | ICD-10-CM | POA: Diagnosis not present

## 2021-06-15 DIAGNOSIS — Z9181 History of falling: Secondary | ICD-10-CM | POA: Diagnosis not present

## 2021-06-22 DIAGNOSIS — Z96641 Presence of right artificial hip joint: Secondary | ICD-10-CM | POA: Diagnosis not present

## 2021-06-22 DIAGNOSIS — M800AXD Age-related osteoporosis with current pathological fracture, other site, subsequent encounter for fracture with routine healing: Secondary | ICD-10-CM | POA: Diagnosis not present

## 2021-06-22 DIAGNOSIS — D7589 Other specified diseases of blood and blood-forming organs: Secondary | ICD-10-CM | POA: Diagnosis not present

## 2021-06-22 DIAGNOSIS — D649 Anemia, unspecified: Secondary | ICD-10-CM | POA: Diagnosis not present

## 2021-06-22 DIAGNOSIS — Z9181 History of falling: Secondary | ICD-10-CM | POA: Diagnosis not present

## 2021-06-22 DIAGNOSIS — K5730 Diverticulosis of large intestine without perforation or abscess without bleeding: Secondary | ICD-10-CM | POA: Diagnosis not present

## 2021-06-22 DIAGNOSIS — M8008XD Age-related osteoporosis with current pathological fracture, vertebra(e), subsequent encounter for fracture with routine healing: Secondary | ICD-10-CM | POA: Diagnosis not present

## 2021-06-22 DIAGNOSIS — M47816 Spondylosis without myelopathy or radiculopathy, lumbar region: Secondary | ICD-10-CM | POA: Diagnosis not present

## 2021-06-22 DIAGNOSIS — M461 Sacroiliitis, not elsewhere classified: Secondary | ICD-10-CM | POA: Diagnosis not present

## 2021-06-26 DIAGNOSIS — Z09 Encounter for follow-up examination after completed treatment for conditions other than malignant neoplasm: Secondary | ICD-10-CM | POA: Diagnosis not present

## 2021-06-26 DIAGNOSIS — S2239XA Fracture of one rib, unspecified side, initial encounter for closed fracture: Secondary | ICD-10-CM | POA: Diagnosis not present

## 2021-06-26 DIAGNOSIS — S32592D Other specified fracture of left pubis, subsequent encounter for fracture with routine healing: Secondary | ICD-10-CM | POA: Diagnosis not present

## 2021-06-26 DIAGNOSIS — E871 Hypo-osmolality and hyponatremia: Secondary | ICD-10-CM | POA: Diagnosis not present

## 2021-06-26 DIAGNOSIS — E876 Hypokalemia: Secondary | ICD-10-CM | POA: Diagnosis not present

## 2021-08-14 DIAGNOSIS — Z780 Asymptomatic menopausal state: Secondary | ICD-10-CM | POA: Diagnosis not present

## 2021-12-11 DIAGNOSIS — R059 Cough, unspecified: Secondary | ICD-10-CM | POA: Diagnosis not present

## 2021-12-11 DIAGNOSIS — F3341 Major depressive disorder, recurrent, in partial remission: Secondary | ICD-10-CM | POA: Diagnosis not present

## 2021-12-11 DIAGNOSIS — Z6825 Body mass index (BMI) 25.0-25.9, adult: Secondary | ICD-10-CM | POA: Diagnosis not present

## 2021-12-11 DIAGNOSIS — J302 Other seasonal allergic rhinitis: Secondary | ICD-10-CM | POA: Diagnosis not present

## 2021-12-11 DIAGNOSIS — F411 Generalized anxiety disorder: Secondary | ICD-10-CM | POA: Diagnosis not present

## 2021-12-24 ENCOUNTER — Inpatient Hospital Stay (HOSPITAL_COMMUNITY)
Admission: EM | Admit: 2021-12-24 | Discharge: 2021-12-29 | DRG: 562 | Disposition: A | Discharge status: Home Health | Payer: Medicare HMO | Attending: Internal Medicine | Admitting: Internal Medicine

## 2021-12-24 ENCOUNTER — Encounter (HOSPITAL_COMMUNITY): Payer: Self-pay

## 2021-12-24 ENCOUNTER — Emergency Department (HOSPITAL_COMMUNITY): Payer: Medicare HMO

## 2021-12-24 DIAGNOSIS — F10920 Alcohol use, unspecified with intoxication, uncomplicated: Secondary | ICD-10-CM | POA: Diagnosis not present

## 2021-12-24 DIAGNOSIS — R000 Tachycardia, unspecified: Secondary | ICD-10-CM | POA: Diagnosis present

## 2021-12-24 DIAGNOSIS — R4182 Altered mental status, unspecified: Secondary | ICD-10-CM | POA: Diagnosis not present

## 2021-12-24 DIAGNOSIS — S0003XA Contusion of scalp, initial encounter: Secondary | ICD-10-CM | POA: Diagnosis not present

## 2021-12-24 DIAGNOSIS — S3210XA Unspecified fracture of sacrum, initial encounter for closed fracture: Secondary | ICD-10-CM | POA: Diagnosis not present

## 2021-12-24 DIAGNOSIS — Y92009 Unspecified place in unspecified non-institutional (private) residence as the place of occurrence of the external cause: Secondary | ICD-10-CM | POA: Diagnosis not present

## 2021-12-24 DIAGNOSIS — M25539 Pain in unspecified wrist: Secondary | ICD-10-CM | POA: Diagnosis not present

## 2021-12-24 DIAGNOSIS — Y906 Blood alcohol level of 120-199 mg/100 ml: Secondary | ICD-10-CM | POA: Diagnosis present

## 2021-12-24 DIAGNOSIS — F32A Depression, unspecified: Secondary | ICD-10-CM | POA: Diagnosis present

## 2021-12-24 DIAGNOSIS — S0101XA Laceration without foreign body of scalp, initial encounter: Secondary | ICD-10-CM | POA: Diagnosis present

## 2021-12-24 DIAGNOSIS — Z1152 Encounter for screening for COVID-19: Secondary | ICD-10-CM

## 2021-12-24 DIAGNOSIS — E785 Hyperlipidemia, unspecified: Secondary | ICD-10-CM | POA: Diagnosis present

## 2021-12-24 DIAGNOSIS — S62102A Fracture of unspecified carpal bone, left wrist, initial encounter for closed fracture: Secondary | ICD-10-CM

## 2021-12-24 DIAGNOSIS — S32119A Unspecified Zone I fracture of sacrum, initial encounter for closed fracture: Secondary | ICD-10-CM | POA: Diagnosis present

## 2021-12-24 DIAGNOSIS — M4312 Spondylolisthesis, cervical region: Secondary | ICD-10-CM | POA: Diagnosis not present

## 2021-12-24 DIAGNOSIS — M19012 Primary osteoarthritis, left shoulder: Secondary | ICD-10-CM | POA: Diagnosis present

## 2021-12-24 DIAGNOSIS — M7582 Other shoulder lesions, left shoulder: Secondary | ICD-10-CM | POA: Diagnosis present

## 2021-12-24 DIAGNOSIS — R0781 Pleurodynia: Secondary | ICD-10-CM

## 2021-12-24 DIAGNOSIS — R0789 Other chest pain: Secondary | ICD-10-CM | POA: Diagnosis present

## 2021-12-24 DIAGNOSIS — F1010 Alcohol abuse, uncomplicated: Secondary | ICD-10-CM | POA: Diagnosis present

## 2021-12-24 DIAGNOSIS — W109XXA Fall (on) (from) unspecified stairs and steps, initial encounter: Secondary | ICD-10-CM | POA: Diagnosis present

## 2021-12-24 DIAGNOSIS — W19XXXA Unspecified fall, initial encounter: Secondary | ICD-10-CM | POA: Diagnosis not present

## 2021-12-24 DIAGNOSIS — E872 Acidosis, unspecified: Secondary | ICD-10-CM | POA: Diagnosis present

## 2021-12-24 DIAGNOSIS — Z9851 Tubal ligation status: Secondary | ICD-10-CM | POA: Diagnosis not present

## 2021-12-24 DIAGNOSIS — G9340 Encephalopathy, unspecified: Secondary | ICD-10-CM | POA: Diagnosis not present

## 2021-12-24 DIAGNOSIS — S0993XA Unspecified injury of face, initial encounter: Secondary | ICD-10-CM | POA: Diagnosis not present

## 2021-12-24 DIAGNOSIS — Z79899 Other long term (current) drug therapy: Secondary | ICD-10-CM | POA: Diagnosis not present

## 2021-12-24 DIAGNOSIS — F10929 Alcohol use, unspecified with intoxication, unspecified: Secondary | ICD-10-CM | POA: Diagnosis not present

## 2021-12-24 DIAGNOSIS — F10129 Alcohol abuse with intoxication, unspecified: Secondary | ICD-10-CM | POA: Diagnosis present

## 2021-12-24 DIAGNOSIS — Z96641 Presence of right artificial hip joint: Secondary | ICD-10-CM | POA: Diagnosis present

## 2021-12-24 DIAGNOSIS — S32512A Fracture of superior rim of left pubis, initial encounter for closed fracture: Secondary | ICD-10-CM | POA: Diagnosis not present

## 2021-12-24 DIAGNOSIS — Z23 Encounter for immunization: Secondary | ICD-10-CM

## 2021-12-24 DIAGNOSIS — Z90710 Acquired absence of both cervix and uterus: Secondary | ICD-10-CM

## 2021-12-24 DIAGNOSIS — G9341 Metabolic encephalopathy: Secondary | ICD-10-CM | POA: Diagnosis present

## 2021-12-24 DIAGNOSIS — E860 Dehydration: Secondary | ICD-10-CM | POA: Diagnosis present

## 2021-12-24 DIAGNOSIS — S52612A Displaced fracture of left ulna styloid process, initial encounter for closed fracture: Principal | ICD-10-CM | POA: Diagnosis present

## 2021-12-24 DIAGNOSIS — S32592A Other specified fracture of left pubis, initial encounter for closed fracture: Secondary | ICD-10-CM | POA: Diagnosis present

## 2021-12-24 DIAGNOSIS — R718 Other abnormality of red blood cells: Secondary | ICD-10-CM

## 2021-12-24 DIAGNOSIS — S52302A Unspecified fracture of shaft of left radius, initial encounter for closed fracture: Secondary | ICD-10-CM | POA: Diagnosis present

## 2021-12-24 DIAGNOSIS — W108XXA Fall (on) (from) other stairs and steps, initial encounter: Principal | ICD-10-CM

## 2021-12-24 DIAGNOSIS — M25532 Pain in left wrist: Secondary | ICD-10-CM | POA: Diagnosis not present

## 2021-12-24 DIAGNOSIS — S32502D Unspecified fracture of left pubis, subsequent encounter for fracture with routine healing: Secondary | ICD-10-CM | POA: Diagnosis not present

## 2021-12-24 DIAGNOSIS — I959 Hypotension, unspecified: Secondary | ICD-10-CM | POA: Diagnosis not present

## 2021-12-24 DIAGNOSIS — Z043 Encounter for examination and observation following other accident: Secondary | ICD-10-CM | POA: Diagnosis not present

## 2021-12-24 DIAGNOSIS — M47812 Spondylosis without myelopathy or radiculopathy, cervical region: Secondary | ICD-10-CM | POA: Diagnosis not present

## 2021-12-24 DIAGNOSIS — T1490XA Injury, unspecified, initial encounter: Secondary | ICD-10-CM | POA: Diagnosis not present

## 2021-12-24 HISTORY — DX: Hyperlipidemia, unspecified: E78.5

## 2021-12-24 LAB — COMPREHENSIVE METABOLIC PANEL
ALT: 29 U/L (ref 0–44)
AST: 45 U/L — ABNORMAL HIGH (ref 15–41)
Albumin: 3.6 g/dL (ref 3.5–5.0)
Alkaline Phosphatase: 46 U/L (ref 38–126)
Anion gap: 13 (ref 5–15)
BUN: 13 mg/dL (ref 8–23)
CO2: 19 mmol/L — ABNORMAL LOW (ref 22–32)
Calcium: 9.4 mg/dL (ref 8.9–10.3)
Chloride: 106 mmol/L (ref 98–111)
Creatinine, Ser: 0.73 mg/dL (ref 0.44–1.00)
GFR, Estimated: >60 mL/min (ref >60)
Glucose, Bld: 111 mg/dL — ABNORMAL HIGH (ref 70–99)
Potassium: 3.8 mmol/L (ref 3.5–5.1)
Sodium: 138 mmol/L (ref 135–145)
Total Bilirubin: 0.6 mg/dL (ref 0.3–1.2)
Total Protein: 6.7 g/dL (ref 6.5–8.1)

## 2021-12-24 LAB — CBC
HCT: 37.3 % (ref 36.0–46.0)
Hemoglobin: 12.3 g/dL (ref 12.0–15.0)
MCH: 36.2 pg — ABNORMAL HIGH (ref 26.0–34.0)
MCHC: 33 g/dL (ref 30.0–36.0)
MCV: 109.7 fL — ABNORMAL HIGH (ref 80.0–100.0)
Platelets: 247 10*3/uL (ref 150–400)
RBC: 3.4 MIL/uL — ABNORMAL LOW (ref 3.87–5.11)
RDW: 13.3 % (ref 11.5–15.5)
WBC: 4.7 10*3/uL (ref 4.0–10.5)
nRBC: 0 % (ref 0.0–0.2)

## 2021-12-24 LAB — RESP PANEL BY RT-PCR (FLU A&B, COVID) ARPGX2
Influenza A by PCR: NEGATIVE
Influenza B by PCR: NEGATIVE
SARS Coronavirus 2 by RT PCR: NEGATIVE

## 2021-12-24 LAB — I-STAT CHEM 8, ED
BUN: 13 mg/dL (ref 8–23)
Calcium, Ion: 1.11 mmol/L — ABNORMAL LOW (ref 1.15–1.40)
Chloride: 104 mmol/L (ref 98–111)
Creatinine, Ser: 1 mg/dL (ref 0.44–1.00)
Glucose, Bld: 107 mg/dL — ABNORMAL HIGH (ref 70–99)
HCT: 39 % (ref 36.0–46.0)
Hemoglobin: 13.3 g/dL (ref 12.0–15.0)
Potassium: 3.7 mmol/L (ref 3.5–5.1)
Sodium: 136 mmol/L (ref 135–145)
TCO2: 21 mmol/L — ABNORMAL LOW (ref 22–32)

## 2021-12-24 LAB — URINALYSIS, ROUTINE W REFLEX MICROSCOPIC
Bilirubin Urine: NEGATIVE
Glucose, UA: NEGATIVE mg/dL
Hgb urine dipstick: NEGATIVE
Ketones, ur: NEGATIVE mg/dL
Leukocytes,Ua: NEGATIVE
Nitrite: NEGATIVE
Protein, ur: NEGATIVE mg/dL
Specific Gravity, Urine: 1.034 — ABNORMAL HIGH (ref 1.005–1.030)
pH: 5 (ref 5.0–8.0)

## 2021-12-24 LAB — SAMPLE TO BLOOD BANK

## 2021-12-24 LAB — LACTIC ACID, PLASMA
Lactic Acid, Venous: 3.6 mmol/L (ref 0.5–1.9)
Lactic Acid, Venous: 2.4 mmol/L (ref 0.5–1.9)

## 2021-12-24 LAB — TROPONIN I (HIGH SENSITIVITY)
Troponin I (High Sensitivity): 3 ng/L (ref <18)
Troponin I (High Sensitivity): 15 ng/L (ref <18)

## 2021-12-24 LAB — ETHANOL: Alcohol, Ethyl (B): 191 mg/dL — ABNORMAL HIGH (ref <10)

## 2021-12-24 LAB — PROTIME-INR
INR: 1.1 (ref 0.8–1.2)
Prothrombin Time: 13.7 seconds (ref 11.4–15.2)

## 2021-12-24 MED ORDER — ACETAMINOPHEN 325 MG PO TABS
650.0000 mg | ORAL_TABLET | Freq: Four times a day (QID) | ORAL | Status: DC | PRN
  Administered 2021-12-24 – 2021-12-29 (×9): 650 mg via ORAL
  Filled 2021-12-24 (×9): qty 2

## 2021-12-24 MED ORDER — ONDANSETRON HCL 4 MG/2ML IJ SOLN: 4.0000 mg | Freq: Four times a day (QID) | INTRAMUSCULAR | Status: DC | PRN

## 2021-12-24 MED ORDER — LIDOCAINE-EPINEPHRINE (PF) 2 %-1:200000 IJ SOLN
INTRAMUSCULAR | Status: AC
  Administered 2021-12-24: 20 mL
  Filled 2021-12-24: qty 20

## 2021-12-24 MED ORDER — LORAZEPAM 2 MG/ML IJ SOLN: 0.0000 mg | Freq: Two times a day (BID) | INTRAMUSCULAR | Status: DC

## 2021-12-24 MED ORDER — THIAMINE HCL 100 MG/ML IJ SOLN
100.0000 mg | Freq: Every day | INTRAMUSCULAR | Status: DC
  Administered 2021-12-26: 100 mg via INTRAVENOUS
  Filled 2021-12-24: qty 2

## 2021-12-24 MED ORDER — LORAZEPAM 1 MG PO TABS
0.0000 mg | ORAL_TABLET | Freq: Four times a day (QID) | ORAL | Status: DC
  Administered 2021-12-24: 1 mg via ORAL
  Filled 2021-12-24: qty 1

## 2021-12-24 MED ORDER — LORAZEPAM 1 MG PO TABS: 0.0000 mg | ORAL_TABLET | Freq: Two times a day (BID) | ORAL | Status: DC

## 2021-12-24 MED ORDER — LACTATED RINGERS IV BOLUS
1000.0000 mL | Freq: Once | INTRAVENOUS | Status: AC
  Administered 2021-12-24: 1000 mL via INTRAVENOUS

## 2021-12-24 MED ORDER — SODIUM CHLORIDE 0.9 % IV BOLUS
1000.0000 mL | Freq: Once | INTRAVENOUS | Status: AC
  Administered 2021-12-24: 1000 mL via INTRAVENOUS

## 2021-12-24 MED ORDER — TETANUS-DIPHTH-ACELL PERTUSSIS 5-2.5-18.5 LF-MCG/0.5 IM SUSY
0.5000 mL | PREFILLED_SYRINGE | Freq: Once | INTRAMUSCULAR | Status: AC
  Administered 2021-12-24: 0.5 mL via INTRAMUSCULAR
  Filled 2021-12-24: qty 0.5

## 2021-12-24 MED ORDER — LACTATED RINGERS IV SOLN: INTRAVENOUS | Status: AC

## 2021-12-24 MED ORDER — FOLIC ACID 1 MG PO TABS
1.0000 mg | ORAL_TABLET | Freq: Every day | ORAL | Status: DC
  Administered 2021-12-25 – 2021-12-29 (×5): 1 mg via ORAL
  Filled 2021-12-24 (×5): qty 1

## 2021-12-24 MED ORDER — FENTANYL CITRATE PF 50 MCG/ML IJ SOSY
50.0000 ug | PREFILLED_SYRINGE | INTRAMUSCULAR | Status: DC | PRN
  Administered 2021-12-24 (×3): 50 ug via INTRAVENOUS
  Filled 2021-12-24 (×3): qty 1

## 2021-12-24 MED ORDER — THIAMINE MONONITRATE 100 MG PO TABS
100.0000 mg | ORAL_TABLET | Freq: Every day | ORAL | Status: DC
  Administered 2021-12-24 – 2021-12-29 (×5): 100 mg via ORAL
  Filled 2021-12-24 (×5): qty 1

## 2021-12-24 MED ORDER — NALOXONE HCL 0.4 MG/ML IJ SOLN: 0.4000 mg | INTRAMUSCULAR | Status: DC | PRN

## 2021-12-24 MED ORDER — PROSIGHT PO TABS
1.0000 | ORAL_TABLET | Freq: Every day | ORAL | Status: DC
  Administered 2021-12-25 – 2021-12-29 (×5): 1 via ORAL
  Filled 2021-12-24 (×5): qty 1

## 2021-12-24 MED ORDER — ONDANSETRON HCL 4 MG/2ML IJ SOLN
4.0000 mg | Freq: Once | INTRAMUSCULAR | Status: AC
  Administered 2021-12-24: 4 mg via INTRAVENOUS
  Filled 2021-12-24: qty 2

## 2021-12-24 MED ORDER — LIDOCAINE-EPINEPHRINE 1 %-1:100000 IJ SOLN
20.0000 mL | Freq: Once | INTRAMUSCULAR | Status: AC
  Administered 2021-12-24: 20 mL via INTRADERMAL

## 2021-12-24 MED ORDER — LORAZEPAM 2 MG/ML IJ SOLN
0.0000 mg | Freq: Four times a day (QID) | INTRAMUSCULAR | Status: DC
  Administered 2021-12-25: 2 mg via INTRAVENOUS
  Administered 2021-12-25: 1 mg via INTRAVENOUS
  Administered 2021-12-26: 2 mg via INTRAVENOUS
  Filled 2021-12-24 (×3): qty 1

## 2021-12-24 MED ORDER — FENTANYL CITRATE PF 50 MCG/ML IJ SOSY
50.0000 ug | PREFILLED_SYRINGE | Freq: Once | INTRAMUSCULAR | Status: AC
  Administered 2021-12-24: 50 ug via INTRAVENOUS
  Filled 2021-12-24: qty 1

## 2021-12-24 MED ORDER — FENTANYL CITRATE PF 50 MCG/ML IJ SOSY
25.0000 ug | PREFILLED_SYRINGE | INTRAMUSCULAR | Status: DC | PRN
  Administered 2021-12-24: 25 ug via INTRAVENOUS
  Filled 2021-12-24: qty 1

## 2021-12-24 MED ORDER — IOHEXOL 350 MG/ML SOLN
100.0000 mL | Freq: Once | INTRAVENOUS | Status: AC | PRN
  Administered 2021-12-24: 75 mL via INTRAVENOUS

## 2021-12-24 MED ORDER — LIDOCAINE-EPINEPHRINE 2 %-1:100000 IJ SOLN: 20.0000 mL | Freq: Once | INTRAMUSCULAR | Status: DC

## 2021-12-24 MED ORDER — ACETAMINOPHEN 650 MG RE SUPP: 650.0000 mg | Freq: Four times a day (QID) | RECTAL | Status: DC | PRN

## 2021-12-24 NOTE — Progress Notes (Signed)
Orthopedic Tech Progress Note Patient Details:  Jasmine Benson [birth year] [id number] Assisted PA. Harris with application of sugartong splint after hematoma block Ortho Devices Type of Ortho Device: Sugartong splint Ortho Device/Splint Location: LUE Ortho Device/Splint Interventions: Application, Ordered   Post Interventions Patient Tolerated: Well  [name] A [name] 12/24/2021, 12:06 PM

## 2021-12-24 NOTE — Consult Note (Signed)
Jasmine Benson is an 76 y.o. female.  HPI: 76 yo female fell while trying to get to the hair dresser. She complains of pain throughout her chest and her left hand.  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; have some in my left knee" (10/05/2016)    Past Surgical History:  Procedure Laterality Date   APPENDECTOMY     AUGMENTATION MAMMAPLASTY Bilateral    CATARACT EXTRACTION W/ INTRAOCULAR LENS IMPLANT Right 07/13/2016   FRACTURE SURGERY     HIP ARTHROPLASTY Right 10/03/2016   Procedure: ARTHROPLASTY BIPOLAR HIP (HEMIARTHROPLASTY);  Surgeon: Teryl Lucy, MD;  Location: [facility] OR;  Service: Orthopedics;  Laterality: Right;   KNEE ARTHROSCOPY Left    "torn meniscus"   SYMPATHECTOMY Left 1966   "lumbar; opened me up"   TONSILLECTOMY     TUBAL LIGATION     VAGINAL HYSTERECTOMY      Family History  Problem Relation Age of Onset   Hypertension Mother    Hypertension Father     Social History:  reports that she has never smoked. She has never used smokeless tobacco. She reports current alcohol use of about 14.0 standard drinks of alcohol per week. She reports that she does not use drugs.  Allergies: No Known Allergies  Medications: I have reviewed the patient's current medications.  Results for orders placed or performed during the hospital encounter of 12/24/21 (from the past 48 hour(s))  Urinalysis, Routine w reflex microscopic     Status: Abnormal   Collection Time: 12/24/21 10:10 AM  Result Value Ref Range   Color, Urine STRAW (A) YELLOW   APPearance CLEAR CLEAR   Specific Gravity, Urine 1.034 (H) 1.005 - 1.030   pH 5.0 5.0 - 8.0   Glucose, UA NEGATIVE NEGATIVE mg/dL   Hgb urine dipstick NEGATIVE NEGATIVE   Bilirubin Urine NEGATIVE NEGATIVE   Ketones, ur NEGATIVE NEGATIVE mg/dL   Protein, ur NEGATIVE NEGATIVE mg/dL   Nitrite NEGATIVE NEGATIVE   Leukocytes,Ua NEGATIVE NEGATIVE    Comment: Performed at [facility],  [address] [place], [place] [address]  Lactic acid, plasma     Status: Abnormal   Collection Time: 12/24/21 10:10 AM  Result Value Ref Range   Lactic Acid, Venous 3.6 (HH) 0.5 - 1.9 mmol/L    Comment: CRITICAL RESULT CALLED TO, READ BACK BY AND VERIFIED WITH S. THORNTON,RN AT 1204 12/24/2021 BY ZBEECH. Performed at [facility], [address] [place], [place] [address]   Comprehensive metabolic panel     Status: Abnormal   Collection Time: 12/24/21 10:25 AM  Result Value Ref Range   Sodium 138 135 - 145 mmol/L   Potassium 3.8 3.5 - 5.1 mmol/L   Chloride 106 98 - 111 mmol/L   CO2 19 (L) 22 - 32 mmol/L   Glucose, Bld 111 (H) 70 - 99 mg/dL    Comment: Glucose reference range applies only to samples taken after fasting for at least 8 hours.   BUN 13 8 - 23 mg/dL   Creatinine, Ser 6.59 0.44 - 1.00 mg/dL   Calcium 9.4 8.9 - 93.5 mg/dL   Total Protein 6.7 6.5 - 8.1 g/dL   Albumin 3.6 3.5 - 5.0 g/dL   AST 45 (H) 15 - 41 U/L   ALT 29 0 - 44 U/L   Alkaline Phosphatase 46 38 - 126 U/L   Total Bilirubin 0.6 0.3 - 1.2 mg/dL  GFR, Estimated >60 >60 mL/min    Comment: (NOTE) Calculated using the CKD-EPI Creatinine Equation (2021)    Anion gap 13 5 - 15    Comment: Performed at [facility], [address] [place], [place] [address]  CBC     Status: Abnormal   Collection Time: 12/24/21 10:25 AM  Result Value Ref Range   WBC 4.7 4.0 - 10.5 K/uL   RBC 3.40 (L) 3.87 - 5.11 MIL/uL   Hemoglobin 12.3 12.0 - 15.0 g/dL   HCT 34.1 93.7 - 90.2 %   MCV 109.7 (H) 80.0 - 100.0 fL   MCH 36.2 (H) 26.0 - 34.0 pg   MCHC 33.0 30.0 - 36.0 g/dL   RDW 40.9 73.5 - 32.9 %   Platelets 247 150 - 400 K/uL   nRBC 0.0 0.0 - 0.2 %    Comment: Performed at [facility], [address], [place], [place] [address]  Ethanol     Status: Abnormal   Collection Time: 12/24/21 10:25 AM  Result Value Ref Range   Alcohol, Ethyl (B) 191 (H) <10 mg/dL    Comment: (NOTE) Lowest detectable limit for  serum alcohol is 10 mg/dL.  For medical purposes only. Performed at [facility], [address], [place], [place] [address]   Troponin I (High Sensitivity)     Status: None   Collection Time: 12/24/21 10:25 AM  Result Value Ref Range   Troponin I (High Sensitivity) 3 <18 ng/L    Comment: (NOTE) Elevated high sensitivity troponin I (hsTnI) values and significant  changes across serial measurements may suggest ACS but many other  chronic and acute conditions are known to elevate hsTnI results.  Refer to the "Links" section for chest pain algorithms and additional  guidance. Performed at [facility], [address], [place], [place] [address]   I-Stat Chem 8, ED     Status: Abnormal   Collection Time: 12/24/21 10:36 AM  Result Value Ref Range   Sodium 136 135 - 145 mmol/L   Potassium 3.7 3.5 - 5.1 mmol/L   Chloride 104 98 - 111 mmol/L   BUN 13 8 - 23 mg/dL   Creatinine, Ser 7.98 0.44 - 1.00 mg/dL   Glucose, Bld 921 (H) 70 - 99 mg/dL    Comment: Glucose reference range applies only to samples taken after fasting for at least 8 hours.   Calcium, Ion 1.11 (L) 1.15 - 1.40 mmol/L   TCO2 21 (L) 22 - 32 mmol/L   Hemoglobin 13.3 12.0 - 15.0 g/dL   HCT 19.4 17.4 - 08.1 %  Sample to Blood Bank     Status: None   Collection Time: 12/24/21 11:20 AM  Result Value Ref Range   Blood Bank Specimen SAMPLE AVAILABLE FOR TESTING    Sample Expiration      12/25/2021,2359 Performed at [facility], [address] [place], [place] [address]   Protime-INR     Status: None   Collection Time: 12/24/21 11:20 AM  Result Value Ref Range   Prothrombin Time 13.7 11.4 - 15.2 seconds   INR 1.1 0.8 - 1.2    Comment: (NOTE) INR goal varies based on device and disease states. Performed at [facility], [address] [place], [place] [address]   Lactic acid, plasma     Status: Abnormal   Collection Time: 12/24/21  3:40 PM  Result Value Ref Range   Lactic Acid, Venous 2.4  (  HH) 0.5 - 1.9 mmol/L    Comment: CRITICAL VALUE NOTED. VALUE IS CONSISTENT WITH PREVIOUSLY REPORTED/CALLED VALUE Performed at [facility], [address], [place], [place] [address]   Resp Panel by RT-PCR (Flu A&B, Covid) Anterior Nasal Swab     Status: None   Collection Time: 12/24/21  5:02 PM   Specimen: Anterior Nasal Swab  Result Value Ref Range   SARS Coronavirus 2 by RT PCR NEGATIVE NEGATIVE    Comment: (NOTE) SARS-CoV-2 target nucleic acids are NOT DETECTED.  The SARS-CoV-2 RNA is generally detectable in upper respiratory specimens during the acute phase of infection. The lowest concentration of SARS-CoV-2 viral copies this assay can detect is 138 copies/mL. A negative result does not preclude SARS-Cov-2 infection and should not be used as the sole basis for treatment or other patient management decisions. A negative result may occur with  improper specimen collection/handling, submission of specimen other than nasopharyngeal swab, presence of viral mutation(s) within the areas targeted by this assay, and inadequate number of viral copies(<138 copies/mL). A negative result must be combined with clinical observations, patient history, and epidemiological information. The expected result is Negative.  Fact Sheet for Patients:  BloggerCourse.com  Fact Sheet for Healthcare Providers:  SeriousBroker.it  This test is no t yet approved or cleared by the [place] FDA and  has been authorized for detection and/or diagnosis of SARS-CoV-2 by FDA under an Emergency Use Authorization (EUA). This EUA will remain  in effect (meaning this test can be used) for the duration of the COVID-19 declaration under Section 564(b)(1) of the Act, 21 U.S.C.section 360bbb-3(b)(1), unless the authorization is terminated  or revoked sooner.       Influenza A by PCR NEGATIVE NEGATIVE   Influenza B by PCR NEGATIVE NEGATIVE    Comment:  (NOTE) The Xpert Xpress SARS-CoV-2/FLU/RSV plus assay is intended as an aid in the diagnosis of influenza from Nasopharyngeal swab specimens and should not be used as a sole basis for treatment. Nasal washings and aspirates are unacceptable for Xpert Xpress SARS-CoV-2/FLU/RSV testing.  Fact Sheet for Patients: BloggerCourse.com  Fact Sheet for Healthcare Providers: SeriousBroker.it  This test is not yet approved or cleared by the [place] FDA and has been authorized for detection and/or diagnosis of SARS-CoV-2 by FDA under an Emergency Use Authorization (EUA). This EUA will remain in effect (meaning this test can be used) for the duration of the COVID-19 declaration under Section 564(b)(1) of the Act, 21 U.S.C. section 360bbb-3(b)(1), unless the authorization is terminated or revoked.  Performed at [facility], [address], [place], [place] [address]   Troponin I (High Sensitivity)     Status: None   Collection Time: 12/24/21  5:56 PM  Result Value Ref Range   Troponin I (High Sensitivity) 15 <18 ng/L    Comment: (NOTE) Elevated high sensitivity troponin I (hsTnI) values and significant  changes across serial measurements may suggest ACS but many other  chronic and acute conditions are known to elevate hsTnI results.  Refer to the "Links" section for chest pain algorithms and additional  guidance. Performed at [facility], [address], [place], [place] [address]     DG Wrist Complete Left  Result Date: 12/24/2021 CLINICAL DATA:  Left wrist pain. EXAM: LEFT WRIST - COMPLETE 3+ VIEW COMPARISON:  None Available. FINDINGS: Generalized osteopenia. Comminuted impacted displaced fracture of the tip of the ulnar styloid process. Displaced fracture of the distal radial metaphysis extending to  the articular surface. No aggressive osseous lesion. Normal alignment. Severe osteoarthritis of the first [facility] joint.  Moderate osteoarthritis of the first MCP joint and first IP joint. 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 Soft tissue are unremarkable. No radiopaque foreign body or soft tissue emphysema. IMPRESSION: 1. Comminuted impacted displaced fracture of the tip of the ulnar styloid process. 2. Displaced fracture of the distal radial metaphysis extending to the articular surface. Electronically Signed   By: Elige KoHetal  Patel M.D.   On: 12/24/2021 12:09   DG Wrist Complete Left  Result Date: 12/24/2021 CLINICAL DATA:  Left wrist pain. EXAM: LEFT WRIST - COMPLETE 3+ VIEW COMPARISON:  None Available. FINDINGS: Generalized osteopenia. Comminuted impacted displaced fracture of the tip of the ulnar styloid process. Displaced fracture of the distal radial metaphysis extending to the articular surface. No aggressive osseous lesion. Normal alignment. Severe osteoarthritis of the first [facility] joint. Moderate osteoarthritis of the first MCP joint and first IP joint. 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 Soft tissue are unremarkable. No radiopaque foreign body or soft tissue emphysema. IMPRESSION: 1. Comminuted impacted displaced fracture of the tip of the ulnar styloid process. 2. Displaced fracture of the distal radial metaphysis extending to the articular surface. Electronically Signed   By: Elige KoHetal  Patel M.D.   On: 12/24/2021 12:09   CT HEAD WO CONTRAST  Result Date: 12/24/2021 CLINICAL DATA:  Provided history: Facial trauma, blunt. Head trauma, moderate/severe. Neck trauma, abnormal mental status or neuro exam. Fall. EXAM: CT HEAD WITHOUT CONTRAST CT MAXILLOFACIAL WITHOUT CONTRAST CT CERVICAL SPINE WITHOUT CONTRAST TECHNIQUE: Multidetector CT imaging of the head, cervical spine, and maxillofacial structures were performed using the standard protocol without intravenous contrast. Multiplanar CT image reconstructions of the cervical spine and maxillofacial structures were also generated. RADIATION  DOSE REDUCTION: This exam was performed according to the departmental dose-optimization program which includes automated exposure control, adjustment of the mA and/or kV according to patient size and/or use of iterative reconstruction technique. COMPARISON:  CT head/cervical spine 10/15/2016. FINDINGS: CT HEAD FINDINGS Brain: Mild generalized cerebral atrophy. There is no acute intracranial hemorrhage. No demarcated cortical infarct. No extra-axial fluid collection. No evidence of an intracranial mass. No midline shift. Vascular: No hyperdense vessel. Atherosclerotic calcifications. Skull: No fracture or aggressive osseous lesion. Other: Left anterior scalp hematoma and laceration. CT MAXILLOFACIAL FINDINGS Osseous: No evidence of acute maxillofacial fracture. Anterior position of the mandibular condyles, bilaterally. Orbits: No acute orbital finding. Sinuses: Minimal mucosal thickening within the anterior ethmoid air cells, bilaterally. Soft tissues: No significant maxillofacial soft tissue swelling appreciable by CT. CT CERVICAL SPINE FINDINGS Mildly motion degraded exam. Alignment: Straightening of the expected cervical lordosis. Slight C2-C3 grade 1 anterolisthesis. 4 mm C7-T1 grade 1 anterolisthesis. Skull base and vertebrae: The basion-dental and atlanto-dental intervals are maintained.No evidence of acute fracture to the cervical spine. Soft tissues and spinal canal: No prevertebral fluid or swelling. No visible canal hematoma. Disc levels: Cervical spondylosis with multilevel disc space narrowing, disc bulges, posterior disc osteophyte complexes, endplate spurring, uncovertebral hypertrophy and facet arthrosis. Disc space narrowing is greatest at C3-C4, C4-C5, C5-C6, C6-C7 and C7-T1 (advanced at these levels). No appreciable high-grade spinal canal stenosis. Multilevel bony neural foraminal narrowing. Degenerative changes about the C1-C2 articulation. Upper chest: No consolidation within the imaged lung  apices. No visible pneumothorax. IMPRESSION: CT head: 1. No evidence of acute intracranial abnormality. 2. Left anterior scalp hematoma and laceration. 3. Mild generalized cerebral atrophy. CT maxillofacial: 1.  No evidence of acute maxillofacial fracture. 2. Anterior position of the bilateral mandibular condyles. Correlate with physical exam findings to exclude TMJ dislocation. CT cervical spine: 1. Mildly motion degraded exam. 2. No evidence of acute fracture to the cervical spine. 3. Grade 1 anterolisthesis at C2-C3 and C7-T1. 4. Cervical spondylosis, as described. Electronically Signed   By: Jackey Loge D.O.   On: 12/24/2021 11:30   CT MAXILLOFACIAL WO CONTRAST  Result Date: 12/24/2021 CLINICAL DATA:  Provided history: Facial trauma, blunt. Head trauma, moderate/severe. Neck trauma, abnormal mental status or neuro exam. Fall. EXAM: CT HEAD WITHOUT CONTRAST CT MAXILLOFACIAL WITHOUT CONTRAST CT CERVICAL SPINE WITHOUT CONTRAST TECHNIQUE: Multidetector CT imaging of the head, cervical spine, and maxillofacial structures were performed using the standard protocol without intravenous contrast. Multiplanar CT image reconstructions of the cervical spine and maxillofacial structures were also generated. RADIATION DOSE REDUCTION: This exam was performed according to the departmental dose-optimization program which includes automated exposure control, adjustment of the mA and/or kV according to patient size and/or use of iterative reconstruction technique. COMPARISON:  CT head/cervical spine 10/15/2016. FINDINGS: CT HEAD FINDINGS Brain: Mild generalized cerebral atrophy. There is no acute intracranial hemorrhage. No demarcated cortical infarct. No extra-axial fluid collection. No evidence of an intracranial mass. No midline shift. Vascular: No hyperdense vessel. Atherosclerotic calcifications. Skull: No fracture or aggressive osseous lesion. Other: Left anterior scalp hematoma and laceration. CT MAXILLOFACIAL FINDINGS  Osseous: No evidence of acute maxillofacial fracture. Anterior position of the mandibular condyles, bilaterally. Orbits: No acute orbital finding. Sinuses: Minimal mucosal thickening within the anterior ethmoid air cells, bilaterally. Soft tissues: No significant maxillofacial soft tissue swelling appreciable by CT. CT CERVICAL SPINE FINDINGS Mildly motion degraded exam. Alignment: Straightening of the expected cervical lordosis. Slight C2-C3 grade 1 anterolisthesis. 4 mm C7-T1 grade 1 anterolisthesis. Skull base and vertebrae: The basion-dental and atlanto-dental intervals are maintained.No evidence of acute fracture to the cervical spine. Soft tissues and spinal canal: No prevertebral fluid or swelling. No visible canal hematoma. Disc levels: Cervical spondylosis with multilevel disc space narrowing, disc bulges, posterior disc osteophyte complexes, endplate spurring, uncovertebral hypertrophy and facet arthrosis. Disc space narrowing is greatest at C3-C4, C4-C5, C5-C6, C6-C7 and C7-T1 (advanced at these levels). No appreciable high-grade spinal canal stenosis. Multilevel bony neural foraminal narrowing. Degenerative changes about the C1-C2 articulation. Upper chest: No consolidation within the imaged lung apices. No visible pneumothorax. IMPRESSION: CT head: 1. No evidence of acute intracranial abnormality. 2. Left anterior scalp hematoma and laceration. 3. Mild generalized cerebral atrophy. CT maxillofacial: 1. No evidence of acute maxillofacial fracture. 2. Anterior position of the bilateral mandibular condyles. Correlate with physical exam findings to exclude TMJ dislocation. CT cervical spine: 1. Mildly motion degraded exam. 2. No evidence of acute fracture to the cervical spine. 3. Grade 1 anterolisthesis at C2-C3 and C7-T1. 4. Cervical spondylosis, as described. Electronically Signed   By: Jackey Loge D.O.   On: 12/24/2021 11:30   CT Cervical Spine Wo Contrast  Result Date: 12/24/2021 CLINICAL DATA:   Provided history: Facial trauma, blunt. Head trauma, moderate/severe. Neck trauma, abnormal mental status or neuro exam. Fall. EXAM: CT HEAD WITHOUT CONTRAST CT MAXILLOFACIAL WITHOUT CONTRAST CT CERVICAL SPINE WITHOUT CONTRAST TECHNIQUE: Multidetector CT imaging of the head, cervical spine, and maxillofacial structures were performed using the standard protocol without intravenous contrast. Multiplanar CT image reconstructions of the cervical spine and maxillofacial structures were also generated. RADIATION DOSE REDUCTION: This exam was performed according to the departmental dose-optimization program which includes automated  exposure control, adjustment of the mA and/or kV according to patient size and/or use of iterative reconstruction technique. COMPARISON:  CT head/cervical spine 10/15/2016. FINDINGS: CT HEAD FINDINGS Brain: Mild generalized cerebral atrophy. There is no acute intracranial hemorrhage. No demarcated cortical infarct. No extra-axial fluid collection. No evidence of an intracranial mass. No midline shift. Vascular: No hyperdense vessel. Atherosclerotic calcifications. Skull: No fracture or aggressive osseous lesion. Other: Left anterior scalp hematoma and laceration. CT MAXILLOFACIAL FINDINGS Osseous: No evidence of acute maxillofacial fracture. Anterior position of the mandibular condyles, bilaterally. Orbits: No acute orbital finding. Sinuses: Minimal mucosal thickening within the anterior ethmoid air cells, bilaterally. Soft tissues: No significant maxillofacial soft tissue swelling appreciable by CT. CT CERVICAL SPINE FINDINGS Mildly motion degraded exam. Alignment: Straightening of the expected cervical lordosis. Slight C2-C3 grade 1 anterolisthesis. 4 mm C7-T1 grade 1 anterolisthesis. Skull base and vertebrae: The basion-dental and atlanto-dental intervals are maintained.No evidence of acute fracture to the cervical spine. Soft tissues and spinal canal: No prevertebral fluid or swelling. No  visible canal hematoma. Disc levels: Cervical spondylosis with multilevel disc space narrowing, disc bulges, posterior disc osteophyte complexes, endplate spurring, uncovertebral hypertrophy and facet arthrosis. Disc space narrowing is greatest at C3-C4, C4-C5, C5-C6, C6-C7 and C7-T1 (advanced at these levels). No appreciable high-grade spinal canal stenosis. Multilevel bony neural foraminal narrowing. Degenerative changes about the C1-C2 articulation. Upper chest: No consolidation within the imaged lung apices. No visible pneumothorax. IMPRESSION: CT head: 1. No evidence of acute intracranial abnormality. 2. Left anterior scalp hematoma and laceration. 3. Mild generalized cerebral atrophy. CT maxillofacial: 1. No evidence of acute maxillofacial fracture. 2. Anterior position of the bilateral mandibular condyles. Correlate with physical exam findings to exclude TMJ dislocation. CT cervical spine: 1. Mildly motion degraded exam. 2. No evidence of acute fracture to the cervical spine. 3. Grade 1 anterolisthesis at C2-C3 and C7-T1. 4. Cervical spondylosis, as described. Electronically Signed   By: Kellie Simmering D.O.   On: 12/24/2021 11:30   CT CHEST ABDOMEN PELVIS W CONTRAST  Result Date: 12/24/2021 CLINICAL DATA:  Poly trauma, fall, head injury EXAM: CT CHEST, ABDOMEN, AND PELVIS WITH CONTRAST TECHNIQUE: Multidetector CT imaging of the chest, abdomen and pelvis was performed following the standard protocol during bolus administration of intravenous contrast. RADIATION DOSE REDUCTION: This exam was performed according to the departmental dose-optimization program which includes automated exposure control, adjustment of the mA and/or kV according to patient size and/or use of iterative reconstruction technique. CONTRAST:  52mL OMNIPAQUE IOHEXOL 350 MG/ML SOLN IV. No oral contrast. COMPARISON:  Hip CT 05/16/2021 FINDINGS: CT CHEST FINDINGS Cardiovascular: Atherosclerotic calcifications aorta. Aorta normal caliber.  Heart unremarkable. No pericardial effusion. Mediastinum/Nodes: Esophagus unremarkable. Probable small hiatal hernia. Capsular calcification at BILATERAL breast prostheses. No thoracic adenopathy. Base of cervical region normal appearance. Lungs/Pleura: Minimal bibasilar atelectasis versus scarring. Minimal biapical scarring. Lungs otherwise clear. No acute infiltrate, pleural effusion, or pneumothorax. Musculoskeletal: Diffuse osseous demineralization. Multiple old healed LEFT rib fractures. RIGHT glenohumeral degenerative changes. Chronic appearing compression deformity of T7 vertebral body. CT ABDOMEN PELVIS FINDINGS Hepatobiliary: Gallbladder and liver normal appearance Pancreas: Normal appearance Spleen: Normal appearance Adrenals/Urinary Tract: Adrenal glands, kidneys, ureters, and bladder normal appearance. Stomach/Bowel: Appendix not visualized. Diverticulosis of descending and sigmoid colon without evidence of diverticulitis. Bowel loops otherwise unremarkable. Vascular/Lymphatic: Atherosclerotic calcifications aorta and iliac arteries without aneurysm. No adenopathy. Reproductive: Uterus surgically absent.  Unremarkable adnexa. Other: No free air or free fluid. Tiny umbilical hernia containing fat. No inflammatory process. Musculoskeletal: RIGHT  hip prosthesis. Osseous demineralization. Subacute healing fractures of the LEFT pubic bones. 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 IMPRESSION: No acute intrathoracic, intra-abdominal, or intrapelvic abnormalities. Distal colonic diverticulosis without evidence of diverticulitis. Tiny umbilical hernia containing fat. Subacute healing fractures of the LEFT pubic bones. Chronic appearing compression deformity of T7 vertebral body. Aortic Atherosclerosis (ICD10-I70.0). Electronically Signed   By: Ulyses Southward M.D.   On: 12/24/2021 11:23   DG Pelvis Portable  Result Date: 12/24/2021 CLINICAL DATA:  Unwitnessed fall  EXAM: PORTABLE PELVIS 1-2 VIEWS COMPARISON:  CT 05/16/2021, 05/15/2021 FINDINGS: Prior right hip hemiarthroplasty. Subacute fractures of the bilateral sacral ala and left superior and inferior pubic rami. No definite acute fracture. No pelvic diastasis. IMPRESSION: Subacute fractures of the bilateral sacral ala and left superior and inferior pubic rami. No definite acute fracture. Electronically Signed   By: Duanne Guess D.O.   On: 12/24/2021 10:49   DG Chest Port 1 View  Result Date: 12/24/2021 CLINICAL DATA:  Unwitnessed fall EXAM: PORTABLE CHEST 1 VIEW COMPARISON:  05/17/2021 FINDINGS: Stable cardiomediastinal contours. Aortic atherosclerosis. No focal airspace consolidation, pleural effusion, or pneumothorax. Remote appearing bilateral rib deformities. Bilateral breast prostheses. IMPRESSION: No active disease. Electronically Signed   By: Duanne Guess D.O.   On: 12/24/2021 10:47    ROS  PE Blood pressure 113/87, pulse (!) 114, temperature 97.8 F (36.6 C), temperature source Oral, resp. rate 19, height 5\' 5"  (1.651 m), weight 66.7 kg, SpO2 100 %. Constitutional: NAD; conversant; left arm in splint, left scalp ecchymosis Eyes: Moist conjunctiva; no lid lag; anicteric; PERRL Neck: Trachea midline; no thyromegaly, no cervicalgia Lungs: Normal respiratory effort; no tactile fremitus CV: RRR; no palpable thrills; no pitting edema GI: Abd soft, NT, ND; no palpable hepatosplenomegaly MSK: unable to assess gait; no clubbing/cyanosis Psychiatric: Appropriate affect; alert and oriented x3 Lymphatic: No palpable cervical or axillary lymphadenopathy   Assessment/Plan: 76 yo female fell from standing height. Alcohol present at time of fall. Wrist fracture splinted. No concussive symptoms. No rib fractures on imaging. -pain control -no interventions by trauma team planned  I reviewed last 24 h vitals and pain scores, last 48 h intake and output, last 24 h labs and trends, and last 24 h  imaging results.  This care required moderate level of medical decision making.   [address] Kiely 12/24/2021, 10:10 PM

## 2021-12-24 NOTE — Progress Notes (Deleted)
Orthopedic Tech Progress Note Patient Details:  Jasmine Benson 1946-02-20 [id number] Assisted P.A Kenton Kingfisher with application of sugartong splint after hematoma block.  Patient ID: Jasmine Benson, female   DOB: 1946/01/10, 76 y.o.   MRN: [id number]  Jasmine Benson 12/24/2021, 12:05 PM

## 2021-12-24 NOTE — Progress Notes (Signed)
   12/24/21 1010  Clinical Encounter Type  Visited With Patient not available  Visit Type Initial;Trauma  Referral From Nurse  Consult/Referral To Chaplain   Chaplain responded to a level two trauma. Patient was under the care of the medical team. No family is present. If a chaplain is requested someone will respond.   Danice Goltz [facility]  [phone]

## 2021-12-24 NOTE — ED Notes (Signed)
Trauma Response Nurse Documentation   Jasmine Benson is a 76 y.o. female arriving to [facility] ED via EMS  On No antithrombotic. Trauma was activated as a Level 2 by Alfredia Client, PA based on the following trauma criteria GCS 10-14 associated with trauma or AVPU < A after arrival due to deterioration enroute with EMS. Unknown if patient fell down stairs or was in a car? Per EMS patient was on [address], found down with blood covering her face. C-collar placed by EMS. Trauma team at the bedside on patient arrival. Patient very confused/agitated.  Patient cleared for CT by Dr. Laverta Baltimore. Pt transported to CT with trauma response nurse present to monitor. Oxygen saturation intermittently low, patient placed on etCO2 for monitoring throughout scan. RN remained with the patient during their absence from the department for clinical observation.   Patient remained GCS 14 for entirety of initial workup. 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 She believes she was on [address] because she had a hair appointment this AM at 930.   History   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; have some in my left knee" (10/05/2016)     Past Surgical History:  Procedure Laterality Date   APPENDECTOMY     AUGMENTATION MAMMAPLASTY Bilateral    CATARACT EXTRACTION W/ INTRAOCULAR LENS IMPLANT Right 07/13/2016   FRACTURE SURGERY     HIP ARTHROPLASTY Right 10/03/2016   Procedure: ARTHROPLASTY BIPOLAR HIP (HEMIARTHROPLASTY);  Surgeon: Marchia Bond, MD;  Location: [place];  Service: Orthopedics;  Laterality: Right;   KNEE ARTHROSCOPY Left    "torn meniscus"   SYMPATHECTOMY Left 1966   "lumbar; opened me up"   TONSILLECTOMY     TUBAL LIGATION     VAGINAL HYSTERECTOMY       Initial Focused Assessment (If applicable, or please see trauma documentation): Patient only oriented x1 to name,  GCS 14, PERR 3 Breath sounds clear, tachypneic and unable to keep a continuous spO2 reading Abdomen soft, nontender Obvious L wrist fracture, pulses +2 Controlled bleeding from head laceration, repaired by EDP  CT's Completed:   CT Head, CT Maxillofacial, CT C-Spine, CT Chest w/ contrast, and CT abdomen/pelvis w/ contrast   Interventions:  IV, trauma labs CXR/PXR/L wrist CT H/Cspine/C/A/P etCO2 - non-rebreather due to spO2 not reading 50 mcg Fent Tetanus Additional 50 mcg Fent, splint to L wrist  Plan for disposition:  Other - unknown at this time. No acute injuries found on imaging except L wrist fx, patient may need admission for pain control  Event Summary: Patient arrived after a fall?, found down and EMS called. Patient arrived with a cane, GCS 14. Patient later able to give more details around her morning. Imaging found no acute injuries except for L wrist fx. Spoke with ED PA and they are going to hold her while she continues to become more oriented, ethanol 191. Will re-evaluate and see if patient needs to be admitted for pain control.  Bedside handoff with ED RN [facility].    Park Pope Tanith Dagostino  Trauma Response RN  Please call TRN at [phone] for further assistance.

## 2021-12-24 NOTE — ED Notes (Signed)
[facility]help ge patient into a gown on the monitor did EKG shown to Dr long patient is resting with nurse and doctor at bedside

## 2021-12-24 NOTE — H&P (Addendum)
History and Physical    PLEASE NOTE THAT DRAGON DICTATION SOFTWARE WAS USED IN THE CONSTRUCTION OF THIS NOTE.   Jasmine Benson LGX:[id number] DOB: 1945/06/05 DOA: 12/24/2021  PCP: Kristen Loader, FNP  Patient coming from: home   I have personally briefly reviewed patient's old medical records in [place]  Chief Complaint: Fall  HPI: Jasmine Benson is a 76 y.o. female with medical history significant for hyperlipidemia, depression, who is admitted to [facility] on 12/24/2021 for pain control in the setting of acute chest wall pain as well as acute left wrist pain after presenting from home to [facility] ED for evaluation of fall.   In the setting of the patient's altered mental status, the following history is provided via my discussions with the EDP and via chart review.  The patient was reportedly found by [facility] at the bottom of some stairs, the number of which are unclear, having appeared to previously fallen.  The patient was conscious at the time, but reported to be confused, and unable to convey the nature of the mechanism of suspected fall leading to her being found in this position.  She was subsequent brought to [facility] emergency department as a trauma alert for further evaluation management thereof.  She does not appear to be on any blood thinners as an outpatient, including no aspirin.  Per chart review, there is documentation of prior fractures that occurred 7 months ago, as well as pubic ramus fractures at that time.   Additionally, per chart review, there is documentation of a history of daily alcohol consumption, with the patient previously reporting consumption of at least 2 glasses of wine on a daily basis, in the absence of any report of recreational drug use.     ED Course:  Vital signs in the ED were notable for the following: Afebrile; heart rate 81-4 12 systolic blood pressure 1 10-1 40; respiratory rate 14-24, oxygen saturation 98 to 100%  on room air.  Labs were notable for the following: CMP notable for the following: Sodium 138, creatinine 0.73, glucose 111, adjusted calcium 9.8, avidin 3.6, otherwise liver enzymes within normal limits.  High-sensitivity troponin I x 2 values were found to be nonelevated; lactic acid 2.4.  CBC notable for white cell count 4700, hemoglobin 12.3 sissy with microcytic findings, platelet count 247.  INR 1.1.  Serum ethanol level 191.  Urinalysis notable for no white blood cells, leukocyte Estrace/nitrate negative, no hemoglobin, no RBCs, and specific gravity of 1.034.  COVID-19/influenza PCR negative.  Imaging and additional notable ED work-up: EKG showed sinus rhythm with heart rate 73, normal intervals, no evidence of T wave or ST changes, including no evidence of ST elevation.  Chest x-ray showed no evidence of acute cardiopulmonary process, including no evidence of infiltrate, edema, effusion, or pneumothorax, also showing no evidence of acute rib fractures.  Plain films of the pelvis showed subacute fractures of the bilateral sacral alla and subacute fracture of the left superior/inferior pubic rami without any evidence of acute fractures.  Plain films of the left wrist demonstrated evidence of commuted impacted displaced fracture of the tip of the ulnar styloid process as well as displaced fracture of the distal radial metaphysis extending into the articular surface.  Noncontrast CT head showed no evidence of acute intracranial process, including no evidence of intracranial hemorrhage or any evidence of acute infarct, while showing evidence of left anterior scalp hematoma.  CT maxillofacial showed no evidence of acute maxillofacial fracture.  CT cervical spine showed no evidence of acute cervical spine fracture.  CT chest, abdomen, pelvis showed no evidence of acute intrathoracic acute intra-abdominal, or acute intrapelvic process, including no evidence of acute rib fractures while showing evidence of a  chronic appearing compression deformity of the T7 vertebral body.   While in the ED, the following were administered: Fentanyl 50 mcg IV x4 doses, Tdap booster, Ativan 1 mg p.o. x1, Zofran 4 mg IV x1, thiamine 100 mg p.o. x1 dose, lactated Ringer's x1 L bolus, normal saline x1 L bolus.  EDP discussed patient's case with the on-call hand surgeon, Dr. Greta Doom, Who conveyed that the left wrist fracture would ultimately require surgical intervention.  Dr.Spears To formally consult, with additional recommendations pending at this time.  Additionally, EDP discussed patient's case with the on-call trauma surgeon, Dr.Kingsinger, Who will also formally consult, with additional recommendations pending at this time.  Subsequently, the patient was admitted for observation for pain control relating to acute chest wall discomfort as well as acute left wrist pain in the setting of acute left wrist fracture, after presenting with unwitnessed fall, with evidence of acute alcohol intoxication superimposed on suspected chronic alcohol abuse, with presenting labs also notable for lactic acidosis.     Review of Systems: As per HPI otherwise 10 point review of systems negative.   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; have some in my left knee" (10/05/2016)   HLD (hyperlipidemia)     Past Surgical History:  Procedure Laterality Date   APPENDECTOMY     AUGMENTATION MAMMAPLASTY Bilateral    CATARACT EXTRACTION W/ INTRAOCULAR LENS IMPLANT Right 07/13/2016   FRACTURE SURGERY     HIP ARTHROPLASTY Right 10/03/2016   Procedure: ARTHROPLASTY BIPOLAR HIP (HEMIARTHROPLASTY);  Surgeon: Marchia Bond, MD;  Location: [facility];  Service: Orthopedics;  Laterality: Right;   KNEE ARTHROSCOPY Left    "torn meniscus"   SYMPATHECTOMY Left 1966   "lumbar; opened me up"   TONSILLECTOMY     TUBAL LIGATION     VAGINAL HYSTERECTOMY      Social History:  reports that she has never smoked. She  has never used smokeless tobacco. She reports current alcohol use of about 14.0 standard drinks of alcohol per week. She reports that she does not use drugs.   No Known Allergies  Family History  Problem Relation Age of Onset   Hypertension Mother    Hypertension Father     Family history reviewed and not pertinent    Prior to Admission medications   Medication Sig Start Date End Date Taking? Authorizing Provider  acetaminophen (TYLENOL) 500 MG tablet Take 500 mg by mouth every 12 (twelve) hours.   Yes [provider]  amoxicillin (AMOXIL) 875 MG tablet Take 875 mg by mouth 2 (two) times daily. 12/11/21  Yes [provider]  Cholecalciferol (VITAMIN D3 PO) Take 1 capsule by mouth daily.   Yes [provider]  citalopram (CELEXA) 20 MG tablet Take 20 mg by mouth daily. 05/08/21  Yes [provider]  ezetimibe (ZETIA) 10 MG tablet Take 10 mg by mouth at bedtime. 04/22/21  Yes [provider]  FOLIC ACID PO Take 2 capsules by mouth daily. 426m   Yes [provider]  methocarbamol (ROBAXIN) 500 MG tablet Take 500 mg by mouth in the morning and at bedtime. 11/03/21  Yes [provider]  montelukast (SINGULAIR) 10 MG tablet Take 10 mg by mouth  daily. 12/14/21  Yes [provider]  rosuvastatin (CRESTOR) 5 MG tablet Take 5 mg by mouth 2 (two) times a week. Monday and Friday 04/15/21  Yes [provider]  traZODone (DESYREL) 150 MG tablet Take 150 mg by mouth at bedtime. 04/12/21  Yes [provider]  ibuprofen (ADVIL) 400 MG tablet Take 1 tablet (400 mg total) by mouth every 6 (six) hours as needed for mild pain or moderate pain. Patient not taking: Reported on 12/24/2021 05/21/21   Pokhrel, Corrie Mckusick, MD  lidocaine (LIDODERM) 5 % Place 1 patch onto the skin daily. Remove & Discard patch within 12 hours or as directed by MD Patient not taking: Reported on 12/24/2021 05/22/21   Pokhrel, Corrie Mckusick, MD  ondansetron  (ZOFRAN) 4 MG tablet Take 1 tablet (4 mg total) by mouth every 6 (six) hours as needed for nausea. Patient not taking: Reported on 12/24/2021 05/21/21   Pokhrel, Corrie Mckusick, MD  pantoprazole (PROTONIX) 40 MG tablet Take 1 tablet (40 mg total) by mouth daily for 14 days. 05/22/21 06/05/21  Flora Lipps, MD     Objective    Physical Exam: Vitals:   12/24/21 1930 12/24/21 2015 12/24/21 2021 12/24/21 2030  BP: (!) 129/92 (!) 141/78  113/87  Pulse: (!) 113 (!) 113  (!) 114  Resp: _0 Temp:   97.8 F (36.6 C)   TempSrc:   Oral   SpO2: 99% 100%  100%  Weight:      Height:        General: appears to be stated age; somnolent, confused Skin: warm, dry, no rash Head:  AT/Agar Mouth:  Oral mucosa membranes appear dry, normal dentition Neck: supple; trachea midline Chest: Anterior chest wall discomfort with palpation, in the absence of any evidence of flail chest/paradoxical rise/fall Heart:  RRR; did not appreciate any M/R/G Lungs: CTAB, did not appreciate any wheezes, rales, or rhonchi Abdomen: + BS; soft, ND, NT Vascular: 2+ pedal pulses b/l; 2+ radial pulses b/l Extremities: left wrist splint, no muscle wasting Neuro: sensation intact in upper and lower extremities b/l    Labs on Admission: I have personally reviewed following labs and imaging studies  CBC: Recent Labs  Lab 12/24/21 1025 12/24/21 1036  WBC 4.7  --   HGB 12.3 13.3  HCT 37.3 39.0  MCV 109.7*  --   PLT 247  --    Basic Metabolic Panel: Recent Labs  Lab 12/24/21 1025 12/24/21 1036  NA 138 136  K 3.8 3.7  CL 106 104  CO2 19*  --   GLUCOSE 111* 107*  BUN 13 13  CREATININE 0.73 1.00  CALCIUM 9.4  --    GFR: Estimated Creatinine Clearance: 43.7 mL/min (by C-G formula based on SCr of 1 mg/dL). Liver Function Tests: Recent Labs  Lab 12/24/21 1025  AST 45*  ALT 29  ALKPHOS 46  BILITOT 0.6  PROT 6.7  ALBUMIN 3.6   No results for input(s): "LIPASE", "AMYLASE" in the last 168 hours. No results  for input(s): "AMMONIA" in the last 168 hours. Coagulation Profile: Recent Labs  Lab 12/24/21 1120  INR 1.1   Cardiac Enzymes: No results for input(s): "CKTOTAL", "CKMB", "CKMBINDEX", "TROPONINI" in the last 168 hours. BNP (last 3 results) No results for input(s): "PROBNP" in the last 8760 hours. HbA1C: No results for input(s): "HGBA1C" in the last 72 hours. CBG: No results for input(s): "GLUCAP" in the last 168 hours. Lipid Profile: No results for input(s): "CHOL", "HDL", "LDLCALC", "  TRIG", "CHOLHDL", "LDLDIRECT" in the last 72 hours. Thyroid Function Tests: No results for input(s): "TSH", "T4TOTAL", "FREET4", "T3FREE", "THYROIDAB" in the last 72 hours. Anemia Panel: No results for input(s): "VITAMINB12", "FOLATE", "FERRITIN", "TIBC", "IRON", "RETICCTPCT" in the last 72 hours. Urine analysis:    Component Value Date/Time   COLORURINE STRAW (A) 12/24/2021 1010   APPEARANCEUR CLEAR 12/24/2021 1010   LABSPEC 1.034 (H) 12/24/2021 1010   PHURINE 5.0 12/24/2021 1010   GLUCOSEU NEGATIVE 12/24/2021 1010   HGBUR NEGATIVE 12/24/2021 1010   BILIRUBINUR NEGATIVE 12/24/2021 1010   KETONESUR NEGATIVE 12/24/2021 1010   PROTEINUR NEGATIVE 12/24/2021 1010   NITRITE NEGATIVE 12/24/2021 1010   LEUKOCYTESUR NEGATIVE 12/24/2021 1010    Radiological Exams on Admission: DG Wrist Complete Left  Result Date: 12/24/2021 CLINICAL DATA:  Left wrist pain. EXAM: LEFT WRIST - COMPLETE 3+ VIEW COMPARISON:  None Available. FINDINGS: Generalized osteopenia. Comminuted impacted displaced fracture of the tip of the ulnar styloid process. Displaced fracture of the distal radial metaphysis extending to the articular surface. No aggressive osseous lesion. Normal alignment. Severe osteoarthritis of the first [facility] joint. Moderate osteoarthritis of the first MCP joint and first IP joint. 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 Soft tissue are unremarkable. No radiopaque foreign body or soft tissue  emphysema. IMPRESSION: 1. Comminuted impacted displaced fracture of the tip of the ulnar styloid process. 2. Displaced fracture of the distal radial metaphysis extending to the articular surface. Electronically Signed   By: Kathreen Devoid M.D.   On: 12/24/2021 12:09   DG Wrist Complete Left  Result Date: 12/24/2021 CLINICAL DATA:  Left wrist pain. EXAM: LEFT WRIST - COMPLETE 3+ VIEW COMPARISON:  None Available. FINDINGS: Generalized osteopenia. Comminuted impacted displaced fracture of the tip of the ulnar styloid process. Displaced fracture of the distal radial metaphysis extending to the articular surface. No aggressive osseous lesion. Normal alignment. Severe osteoarthritis of the first [facility] joint. Moderate osteoarthritis of the first MCP joint and first IP joint. 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 Soft tissue are unremarkable. No radiopaque foreign body or soft tissue emphysema. IMPRESSION: 1. Comminuted impacted displaced fracture of the tip of the ulnar styloid process. 2. Displaced fracture of the distal radial metaphysis extending to the articular surface. Electronically Signed   By: Kathreen Devoid M.D.   On: 12/24/2021 12:09   CT HEAD WO CONTRAST  Result Date: 12/24/2021 CLINICAL DATA:  Provided history: Facial trauma, blunt. Head trauma, moderate/severe. Neck trauma, abnormal mental status or neuro exam. Fall. EXAM: CT HEAD WITHOUT CONTRAST CT MAXILLOFACIAL WITHOUT CONTRAST CT CERVICAL SPINE WITHOUT CONTRAST TECHNIQUE: Multidetector CT imaging of the head, cervical spine, and maxillofacial structures were performed using the standard protocol without intravenous contrast. Multiplanar CT image reconstructions of the cervical spine and maxillofacial structures were also generated. RADIATION DOSE REDUCTION: This exam was performed according to the departmental dose-optimization program which includes automated exposure control, adjustment of the mA and/or kV according to patient size  and/or use of iterative reconstruction technique. COMPARISON:  CT head/cervical spine 10/15/2016. FINDINGS: CT HEAD FINDINGS Brain: Mild generalized cerebral atrophy. There is no acute intracranial hemorrhage. No demarcated cortical infarct. No extra-axial fluid collection. No evidence of an intracranial mass. No midline shift. Vascular: No hyperdense vessel. Atherosclerotic calcifications. Skull: No fracture or aggressive osseous lesion. Other: Left anterior scalp hematoma and laceration. CT MAXILLOFACIAL FINDINGS Osseous: No evidence of acute maxillofacial fracture. Anterior position of the mandibular condyles, bilaterally. Orbits: No acute orbital finding.  Sinuses: Minimal mucosal thickening within the anterior ethmoid air cells, bilaterally. Soft tissues: No significant maxillofacial soft tissue swelling appreciable by CT. CT CERVICAL SPINE FINDINGS Mildly motion degraded exam. Alignment: Straightening of the expected cervical lordosis. Slight C2-C3 grade 1 anterolisthesis. 4 mm C7-T1 grade 1 anterolisthesis. Skull base and vertebrae: The basion-dental and atlanto-dental intervals are maintained.No evidence of acute fracture to the cervical spine. Soft tissues and spinal canal: No prevertebral fluid or swelling. No visible canal hematoma. Disc levels: Cervical spondylosis with multilevel disc space narrowing, disc bulges, posterior disc osteophyte complexes, endplate spurring, uncovertebral hypertrophy and facet arthrosis. Disc space narrowing is greatest at C3-C4, C4-C5, C5-C6, C6-C7 and C7-T1 (advanced at these levels). No appreciable high-grade spinal canal stenosis. Multilevel bony neural foraminal narrowing. Degenerative changes about the C1-C2 articulation. Upper chest: No consolidation within the imaged lung apices. No visible pneumothorax. IMPRESSION: CT head: 1. No evidence of acute intracranial abnormality. 2. Left anterior scalp hematoma and laceration. 3. Mild generalized cerebral atrophy. CT  maxillofacial: 1. No evidence of acute maxillofacial fracture. 2. Anterior position of the bilateral mandibular condyles. Correlate with physical exam findings to exclude TMJ dislocation. CT cervical spine: 1. Mildly motion degraded exam. 2. No evidence of acute fracture to the cervical spine. 3. Grade 1 anterolisthesis at C2-C3 and C7-T1. 4. Cervical spondylosis, as described. Electronically Signed   By: Kellie Simmering D.O.   On: 12/24/2021 11:30   CT MAXILLOFACIAL WO CONTRAST  Result Date: 12/24/2021 CLINICAL DATA:  Provided history: Facial trauma, blunt. Head trauma, moderate/severe. Neck trauma, abnormal mental status or neuro exam. Fall. EXAM: CT HEAD WITHOUT CONTRAST CT MAXILLOFACIAL WITHOUT CONTRAST CT CERVICAL SPINE WITHOUT CONTRAST TECHNIQUE: Multidetector CT imaging of the head, cervical spine, and maxillofacial structures were performed using the standard protocol without intravenous contrast. Multiplanar CT image reconstructions of the cervical spine and maxillofacial structures were also generated. RADIATION DOSE REDUCTION: This exam was performed according to the departmental dose-optimization program which includes automated exposure control, adjustment of the mA and/or kV according to patient size and/or use of iterative reconstruction technique. COMPARISON:  CT head/cervical spine 10/15/2016. FINDINGS: CT HEAD FINDINGS Brain: Mild generalized cerebral atrophy. There is no acute intracranial hemorrhage. No demarcated cortical infarct. No extra-axial fluid collection. No evidence of an intracranial mass. No midline shift. Vascular: No hyperdense vessel. Atherosclerotic calcifications. Skull: No fracture or aggressive osseous lesion. Other: Left anterior scalp hematoma and laceration. CT MAXILLOFACIAL FINDINGS Osseous: No evidence of acute maxillofacial fracture. Anterior position of the mandibular condyles, bilaterally. Orbits: No acute orbital finding. Sinuses: Minimal mucosal thickening within the  anterior ethmoid air cells, bilaterally. Soft tissues: No significant maxillofacial soft tissue swelling appreciable by CT. CT CERVICAL SPINE FINDINGS Mildly motion degraded exam. Alignment: Straightening of the expected cervical lordosis. Slight C2-C3 grade 1 anterolisthesis. 4 mm C7-T1 grade 1 anterolisthesis. Skull base and vertebrae: The basion-dental and atlanto-dental intervals are maintained.No evidence of acute fracture to the cervical spine. Soft tissues and spinal canal: No prevertebral fluid or swelling. No visible canal hematoma. Disc levels: Cervical spondylosis with multilevel disc space narrowing, disc bulges, posterior disc osteophyte complexes, endplate spurring, uncovertebral hypertrophy and facet arthrosis. Disc space narrowing is greatest at C3-C4, C4-C5, C5-C6, C6-C7 and C7-T1 (advanced at these levels). No appreciable high-grade spinal canal stenosis. Multilevel bony neural foraminal narrowing. Degenerative changes about the C1-C2 articulation. Upper chest: No consolidation within the imaged lung apices. No visible pneumothorax. IMPRESSION: CT head: 1. No evidence of acute intracranial abnormality. 2. Left anterior scalp hematoma and  laceration. 3. Mild generalized cerebral atrophy. CT maxillofacial: 1. No evidence of acute maxillofacial fracture. 2. Anterior position of the bilateral mandibular condyles. Correlate with physical exam findings to exclude TMJ dislocation. CT cervical spine: 1. Mildly motion degraded exam. 2. No evidence of acute fracture to the cervical spine. 3. Grade 1 anterolisthesis at C2-C3 and C7-T1. 4. Cervical spondylosis, as described. Electronically Signed   By: Kellie Simmering D.O.   On: 12/24/2021 11:30   CT Cervical Spine Wo Contrast  Result Date: 12/24/2021 CLINICAL DATA:  Provided history: Facial trauma, blunt. Head trauma, moderate/severe. Neck trauma, abnormal mental status or neuro exam. Fall. EXAM: CT HEAD WITHOUT CONTRAST CT MAXILLOFACIAL WITHOUT CONTRAST CT  CERVICAL SPINE WITHOUT CONTRAST TECHNIQUE: Multidetector CT imaging of the head, cervical spine, and maxillofacial structures were performed using the standard protocol without intravenous contrast. Multiplanar CT image reconstructions of the cervical spine and maxillofacial structures were also generated. RADIATION DOSE REDUCTION: This exam was performed according to the departmental dose-optimization program which includes automated exposure control, adjustment of the mA and/or kV according to patient size and/or use of iterative reconstruction technique. COMPARISON:  CT head/cervical spine 10/15/2016. FINDINGS: CT HEAD FINDINGS Brain: Mild generalized cerebral atrophy. There is no acute intracranial hemorrhage. No demarcated cortical infarct. No extra-axial fluid collection. No evidence of an intracranial mass. No midline shift. Vascular: No hyperdense vessel. Atherosclerotic calcifications. Skull: No fracture or aggressive osseous lesion. Other: Left anterior scalp hematoma and laceration. CT MAXILLOFACIAL FINDINGS Osseous: No evidence of acute maxillofacial fracture. Anterior position of the mandibular condyles, bilaterally. Orbits: No acute orbital finding. Sinuses: Minimal mucosal thickening within the anterior ethmoid air cells, bilaterally. Soft tissues: No significant maxillofacial soft tissue swelling appreciable by CT. CT CERVICAL SPINE FINDINGS Mildly motion degraded exam. Alignment: Straightening of the expected cervical lordosis. Slight C2-C3 grade 1 anterolisthesis. 4 mm C7-T1 grade 1 anterolisthesis. Skull base and vertebrae: The basion-dental and atlanto-dental intervals are maintained.No evidence of acute fracture to the cervical spine. Soft tissues and spinal canal: No prevertebral fluid or swelling. No visible canal hematoma. Disc levels: Cervical spondylosis with multilevel disc space narrowing, disc bulges, posterior disc osteophyte complexes, endplate spurring, uncovertebral hypertrophy and  facet arthrosis. Disc space narrowing is greatest at C3-C4, C4-C5, C5-C6, C6-C7 and C7-T1 (advanced at these levels). No appreciable high-grade spinal canal stenosis. Multilevel bony neural foraminal narrowing. Degenerative changes about the C1-C2 articulation. Upper chest: No consolidation within the imaged lung apices. No visible pneumothorax. IMPRESSION: CT head: 1. No evidence of acute intracranial abnormality. 2. Left anterior scalp hematoma and laceration. 3. Mild generalized cerebral atrophy. CT maxillofacial: 1. No evidence of acute maxillofacial fracture. 2. Anterior position of the bilateral mandibular condyles. Correlate with physical exam findings to exclude TMJ dislocation. CT cervical spine: 1. Mildly motion degraded exam. 2. No evidence of acute fracture to the cervical spine. 3. Grade 1 anterolisthesis at C2-C3 and C7-T1. 4. Cervical spondylosis, as described. Electronically Signed   By: Kellie Simmering D.O.   On: 12/24/2021 11:30   CT CHEST ABDOMEN PELVIS W CONTRAST  Result Date: 12/24/2021 CLINICAL DATA:  Poly trauma, fall, head injury EXAM: CT CHEST, ABDOMEN, AND PELVIS WITH CONTRAST TECHNIQUE: Multidetector CT imaging of the chest, abdomen and pelvis was performed following the standard protocol during bolus administration of intravenous contrast. RADIATION DOSE REDUCTION: This exam was performed according to the departmental dose-optimization program which includes automated exposure control, adjustment of the mA and/or kV according to patient size and/or use of iterative reconstruction technique. CONTRAST:  25m OMNIPAQUE IOHEXOL 350 MG/ML SOLN IV. No oral contrast. COMPARISON:  Hip CT 05/16/2021 FINDINGS: CT CHEST FINDINGS Cardiovascular: Atherosclerotic calcifications aorta. Aorta normal caliber. Heart unremarkable. No pericardial effusion. Mediastinum/Nodes: Esophagus unremarkable. Probable small hiatal hernia. Capsular calcification at BILATERAL breast prostheses. No thoracic adenopathy.  Base of cervical region normal appearance. Lungs/Pleura: Minimal bibasilar atelectasis versus scarring. Minimal biapical scarring. Lungs otherwise clear. No acute infiltrate, pleural effusion, or pneumothorax. Musculoskeletal: Diffuse osseous demineralization. Multiple old healed LEFT rib fractures. RIGHT glenohumeral degenerative changes. Chronic appearing compression deformity of T7 vertebral body. CT ABDOMEN PELVIS FINDINGS Hepatobiliary: Gallbladder and liver normal appearance Pancreas: Normal appearance Spleen: Normal appearance Adrenals/Urinary Tract: Adrenal glands, kidneys, ureters, and bladder normal appearance. Stomach/Bowel: Appendix not visualized. Diverticulosis of descending and sigmoid colon without evidence of diverticulitis. Bowel loops otherwise unremarkable. Vascular/Lymphatic: Atherosclerotic calcifications aorta and iliac arteries without aneurysm. No adenopathy. Reproductive: Uterus surgically absent.  Unremarkable adnexa. Other: No free air or free fluid. Tiny umbilical hernia containing fat. No inflammatory process. Musculoskeletal: RIGHT hip prosthesis. Osseous demineralization. Subacute healing fractures of the LEFT pubic bones. 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 IMPRESSION: No acute intrathoracic, intra-abdominal, or intrapelvic abnormalities. Distal colonic diverticulosis without evidence of diverticulitis. Tiny umbilical hernia containing fat. Subacute healing fractures of the LEFT pubic bones. Chronic appearing compression deformity of T7 vertebral body. Aortic Atherosclerosis (ICD10-I70.0). Electronically Signed   By: MLavonia DanaM.D.   On: 12/24/2021 11:23   DG Pelvis Portable  Result Date: 12/24/2021 CLINICAL DATA:  Unwitnessed fall EXAM: PORTABLE PELVIS 1-2 VIEWS COMPARISON:  CT 05/16/2021, 05/15/2021 FINDINGS: Prior right hip hemiarthroplasty. Subacute fractures of the bilateral sacral ala and left superior and inferior pubic  rami. No definite acute fracture. No pelvic diastasis. IMPRESSION: Subacute fractures of the bilateral sacral ala and left superior and inferior pubic rami. No definite acute fracture. Electronically Signed   By: NDavina PokeD.O.   On: 12/24/2021 10:49   DG Chest Port 1 View  Result Date: 12/24/2021 CLINICAL DATA:  Unwitnessed fall EXAM: PORTABLE CHEST 1 VIEW COMPARISON:  05/17/2021 FINDINGS: Stable cardiomediastinal contours. Aortic atherosclerosis. No focal airspace consolidation, pleural effusion, or pneumothorax. Remote appearing bilateral rib deformities. Bilateral breast prostheses. IMPRESSION: No active disease. Electronically Signed   By: NDavina PokeD.O.   On: 12/24/2021 10:47     EKG: Independently reviewed, with result as described above.    Assessment/Plan   Principal Problem:   Chest wall pain Active Problems:   Left wrist fracture, closed, initial encounter   Fall at home, initial encounter   Acute alcohol intoxication ([place]   Lactic acidosis   Acute encephalopathy   Depression   Chronic alcohol abuse   Hyperlipidemia      #) Anterior chest wall pain: Acute discomfort limited to the anterior chest wall, prompted/exacerbated with palpation over the anterior chest wall, that has been nonexertional and occurs in the absence of any typical features.  Appears musculoskeletal in nature stemming from unwitnessed fall earlier this morning, with chest x-ray as well as CT chest showing no evidence of acute process, including no evidence of acute rib fracture, acute sternal fracture, and no evidence of acute cardiopulmonary process, including no evidence of pneumothorax.  We will strive for improvement in pain control to promote adequate inspiratory effort so as to reduce risk of ensuing development of pneumonia.  On-call trauma surgeon consulted, with additional recommendations pending at this time.  No evidence to suggest ACS, including troponin x2 that were nonelevated as  well as EKG which showed no evidence of acute ischemic changes.  Clinically, acute pulmonary embolism also appears less likely.  Plan: Prn IV fentanyl.  Incentive spirometry ordered.  Repeat CBC in the morning.  Trauma surgeon consulted, with additional recommendations pending at this time.          #) Acute left wrist fracture: Stemming from unwitnessed fall earlier today, the patient complains of new onset left wrist discomfort, with plain films showing evidence of commuted impacted displaced fracture of the tip of the ulnar styloid process as well as displaced fracture of the distal radial metaphysis extending into the articular surface.  Suspect that this fracture will ultimately require surgical intervention.  Left upper extremity appears neurovascularly intact at this time.  EDP has discussed patient's case with the on-call hand surgeon, Dr. Greta Doom, Who will formally consult, with additional recommendations pending at this time.  Fracture was sent by EDP today.  Plan: Prn IV fentanyl, as above.  Hand surgeon consulted, with additional recommendations pending at this time.          #) Fall: Unwitnessed fall on the morning of 11-23, which is suspected to have been mechanical in nature, with associated risk factors including the patient's acute alcohol intoxication with serum ethanol level of nearly 200 at the time of presentation this morning.  It appears that she fell down a few stairs as a component of this and it is fall, although the total number of stairs is not entirely clear to me at this time.  Does not appear that this fall was associated with any loss of consciousness, although the patient is unable to convey significant specific details as it relates to the mechanism of this fall.  She is noted to have small hematoma on the left anterior scalp, with CT head showing no evidence of acute intracranial process, including no evidence of intracranial hemorrhage.  Does not appear to  be on a blood thinners as an outpatient.  In the context of suspected chronic alcohol abuse, will also check B1 level to assess for any predisposing implications towards dorsal column pathology that may have otherwise offered predisposition towards presenting fall.  No evidence of underlying infectious process.  No evidence of acute fracture per extensive imaging performed in the ED today, as further detailed above.  Status post Tdap booster in the ED today.  Trauma surgeon consulted, with additional recommendations pending at this time.  Plan: Follow-up results of B1 level.  Fall precautions ordered.  Trauma surgeon consulted, with additional recommendations pending at this time.  Physical therapy consult ordered for the morning.  Repeat CMP/CBC in the morning.         #) Acute alcohol intoxication: Patient presented to the emergency this department earlier this morning with evidence of acute alcohol intoxication, with presenting serum ethanol level of nearly 200, consistent with suspected chronic alcohol abuse, with chart review revealing the patient's previous acknowledgment of daily alcohol consumption, as further detailed above.  The macrocytic nature of her presenting hemoglobin appears to commiserate this suspicion.  No overt evidence of alcohol withdrawal at this time, although it appears that she is predisposed to ensuing development of such, which prompted initiation of CIWA protocol in the emergency department today.  Plan: Continue CIWA protocol.  Seizure precautions.  Repeat CMP.  Check serum magnesium and phosphorus levels.  Consult placed to the transitional care team.  Follow-up for B1 level.  Daily thiamine, first dose now ordered.  Additionally, place orders for daily  multivitamin as well as daily oral folic acid supplementation.         #) Lactic acidosis: Mildly elevated initial lactate 2.4, with suspected contributions from alcoholic lactic acidosis as well as an element of  dehydration as evidenced by elevated specific gravity identified on presenting urinalysis.  Of note, INR appears preserved at 1.1.  No evidence of underlying infectious process, criteria are not met for sepsis.  Consider the possibility of increased risk for rhabdomyolysis, including associated contribution towards elevated lactate.  However, presenting urinalysis demonstrated no evidence to suggest myoglobinuria, rendering this possibility to appear less likely.  Plan: Continuous lactated Ringer's, with repeat lactate ordered for the morning.  Repeat CMP/CBC in the morning.         #) Acute encephalopathy: The patient's somnolence/confusion appears to be toxic in nature on the basis of her acute alcohol intoxication, as further detailed above.  No overt evidence of underlying infectious process, as further detailed above.  Her baseline will status is not entirely clear.  We will consider expansion of metabolic evaluation if mental status is not continuing to improve with metabolism of her presenting acute alcohol intoxication.  As noted above, CT head showed no evidence of acute process.  Of note, she is status post first dose of thiamine in the ED today.  Plan: Further evaluation management of acute alcohol intoxication superimposed on suspected chronic alcohol abuse, as above.  Continue daily thiamine along with multivitamin/folic acid supplementation, as above.  Repeat CMP/CBC in the morning.          #) Hyperlipidemia: Documented history of such, on Zetia as an outpatient.  Plan: Continue home Zetia.           #) Depression: Documented history of such, on Celexa as an outpatient.  Of note, presenting EKG shows no evidence of QTc prolongation.  Plan: Continue home Celexa.     DVT prophylaxis: SCD's   Code Status: Full code Family Communication: none Disposition Plan: Per Rounding Team Consults called: EDP has consulted the hand surgeon as well as trauma surgeon, as  further detailed above;  Admission status: Observation    PLEASE NOTE THAT DRAGON DICTATION SOFTWARE WAS USED IN THE CONSTRUCTION OF THIS NOTE.   [place] DO [facility]  From [place]   12/24/2021, 9:36 PM

## 2021-12-24 NOTE — ED Provider Notes (Signed)
[facility] EMERGENCY DEPARTMENT Provider Note   CSN: [id number] Arrival date & time: 12/24/21  1004     History  Chief Complaint  Patient presents with   Jasmine Benson Jasmine Benson is a 76 y.o. female.  Patient arrives by EMS. Found at the bottom of concrete stairs with blood. HDS. EMS reports obvious head lac and left wrist defomity. Patient was initially A&O but progressively more confused and now with repetitive questioning. I evaluated the patient, she was immediately placed on spinal precautions with [place] J collar, rings removed from fingers, fully undressed, ATLS work-up initiated, level 2 trauma activated  The history is provided by the EMS personnel and medical records.  Trauma Mechanism of injury: Presumed fall Injury location: shoulder/arm, torso and head/neck Injury location detail: head, L wrist and L chest and R chest Incident location: patient found by ems at the bottom of 4 concrete stairs. Time since incident: presumed to be within the hours- blood appears fresh. Arrived directly from scene: yes       Suspicion of alcohol use: unknown.      Suspicion of drug use: unkown.  EMS/PTA data:      Ambulatory at scene: yes      Blood loss: moderate      Responsiveness: alert      Oriented to: person (disoriented, confused and with repetetive question)      Loss of consciousness: unknown.      Amnesic to event: yes      Airway interventions: none      IV access: none      Immobilization: none      Breathing condition since incident: worsening (now on NRB)      Circulation condition since incident: stable      Mental status condition since incident: worsening  Current symptoms:      Pain scale: severe.      Associated symptoms:            Reports chest pain.            Denies abdominal pain and back pain. Loss of consciousness: unknown.  Relevant PMH:      Medical risk factors:            Osteoporosis      Pharmacological risk  factors:            No anticoagulation therapy.       Tetanus status: unknown      Home Medications Prior to Admission medications   Medication Sig Start Date End Date Taking? Authorizing Provider  acetaminophen (TYLENOL) 500 MG tablet Take 500 mg by mouth every 12 (twelve) hours.   Yes [provider]  amoxicillin (AMOXIL) 875 MG tablet Take 875 mg by mouth 2 (two) times daily. 12/11/21  Yes [provider]  Cholecalciferol (VITAMIN D3 PO) Take 1 capsule by mouth daily.   Yes [provider]  citalopram (CELEXA) 20 MG tablet Take 20 mg by mouth daily. 05/08/21  Yes [provider]  ezetimibe (ZETIA) 10 MG tablet Take 10 mg by mouth at bedtime. 04/22/21  Yes [provider]  FOLIC ACID PO Take 2 capsules by mouth daily.    Yes [provider]  methocarbamol (ROBAXIN) 500 MG tablet Take 500 mg by mouth in the morning and at bedtime. 11/03/21  Yes [provider]  montelukast (SINGULAIR) 10 MG tablet Take 10 mg by mouth daily. 12/14/21  Yes [provider]  rosuvastatin (  CRESTOR) 5 MG tablet Take 5 mg by mouth 2 (two) times a week. Monday and Friday 04/15/21  Yes [provider]  traZODone (DESYREL) 150 MG tablet Take 150 mg by mouth at bedtime. 04/12/21  Yes [provider]  ibuprofen (ADVIL) 400 MG tablet Take 1 tablet (400 mg total) by mouth every 6 (six) hours as needed for mild pain or moderate pain. Patient not taking: Reported on 12/24/2021 05/21/21   Pokhrel, Rebekah Chesterfield, MD  lidocaine (LIDODERM) 5 % Place 1 patch onto the skin daily. Remove & Discard patch within 12 hours or as directed by MD Patient not taking: Reported on 12/24/2021 05/22/21   Pokhrel, Rebekah Chesterfield, MD  ondansetron (ZOFRAN) 4 MG tablet Take 1 tablet (4 mg total) by mouth every 6 (six) hours as needed for nausea. Patient not taking: Reported on 12/24/2021 05/21/21   Pokhrel, Rebekah Chesterfield, MD  pantoprazole (PROTONIX) 40 MG tablet Take 1 tablet (40 mg  total) by mouth daily for 14 days. 05/22/21 06/05/21  Pokhrel, Rebekah Chesterfield, MD      Allergies    Patient has no known allergies.    Review of Systems   Review of Systems  Cardiovascular:  Positive for chest pain.  Gastrointestinal:  Negative for abdominal pain.  Musculoskeletal:  Positive for joint swelling. Negative for back pain.  Skin:  Positive for wound.  Neurological:  Loss of consciousness: unknown.  Psychiatric/Behavioral:  Positive for confusion.     Physical Exam Updated Vital Signs BP (!) 140/84   Pulse (!) 101   Temp 98.9 F (37.2 C) (Oral)   Resp (!) 22   Ht  (1.651 m)   Wt 66.7 kg   LMP  (LMP Unknown)   SpO2 99%   BMI 24.46 kg/m  Physical Exam Constitutional:      Appearance: She is well-groomed.     Interventions: Cervical collar, nasal cannula and face mask in place.  HENT:     Head: Normocephalic.      Comments: + Left forehead lac, active bleeding No facial deformities, epistaxis battles signs or racoon's eyes Eyes:     General: Lids are normal.     Extraocular Movements: Extraocular movements intact.     Conjunctiva/sclera: Conjunctivae normal.     Pupils: Pupils are equal, round, and reactive to light.  Neck:     Comments: Placed in C collar Cardiovascular:     Rate and Rhythm: Normal rate and regular rhythm.     Pulses:          Radial pulses are 2+ on the right side and 2+ on the left side.       Dorsalis pedis pulses are 2+ on the right side and 2+ on the left side.  Pulmonary:     Breath sounds: Normal breath sounds. Decreased air movement present.  Chest:     Chest wall: Tenderness and crepitus present.     Comments: Bony crepitus to palpation of the chest wall Abdominal:     Palpations: Abdomen is soft.     Tenderness: There is no abdominal tenderness.  Musculoskeletal:     Thoracic back: Normal. No bony tenderness.     Lumbar back: Normal. No bony tenderness.     Comments: Left wrist with obvious deformity. Radial pulse 2 + Pelvis  stable - minimal tenderness over the L hip. Moves lower extrimites without pain.   Neurological:     Mental Status: She is alert. She is disoriented and confused.     GCS:  GCS eye subscore is 4. GCS verbal subscore is 4. GCS motor subscore is 6.     Comments: Extrapyramidal movements     ED Results / Procedures / Treatments   Labs (all labs ordered are listed, but only abnormal results are displayed) Labs Reviewed  COMPREHENSIVE METABOLIC PANEL - Abnormal; Notable for the following components:      Result Value   CO2 19 (*)    Glucose, Bld 111 (*)    AST 45 (*)    All other components within normal limits  CBC - Abnormal; Notable for the following components:   RBC 3.40 (*)    MCV 109.7 (*)    MCH 36.2 (*)    All other components within normal limits  ETHANOL - Abnormal; Notable for the following components:   Alcohol, Ethyl (B) 191 (*)    All other components within normal limits  URINALYSIS, ROUTINE W REFLEX MICROSCOPIC - Abnormal; Notable for the following components:   Color, Urine STRAW (*)    Specific Gravity, Urine 1.034 (*)    All other components within normal limits  LACTIC ACID, PLASMA - Abnormal; Notable for the following components:   Lactic Acid, Venous 3.6 (*)    All other components within normal limits  LACTIC ACID, PLASMA - Abnormal; Notable for the following components:   Lactic Acid, Venous 2.4 (*)    All other components within normal limits  CBC WITH DIFFERENTIAL/PLATELET - Abnormal; Notable for the following components:   RBC 2.85 (*)    Hemoglobin 10.6 (*)    HCT 31.3 (*)    MCV 109.8 (*)    MCH 37.2 (*)    All other components within normal limits  COMPREHENSIVE METABOLIC PANEL - Abnormal; Notable for the following components:   Glucose, Bld 106 (*)    BUN 7 (*)    Calcium 8.7 (*)    Total Protein 5.6 (*)    Albumin 3.0 (*)    All other components within normal limits  MAGNESIUM - Abnormal; Notable for the following components:   Magnesium  1.6 (*)    All other components within normal limits  I-STAT CHEM 8, ED - Abnormal; Notable for the following components:   Glucose, Bld 107 (*)    Calcium, Ion 1.11 (*)    TCO2 21 (*)    All other components within normal limits  RESP PANEL BY RT-PCR (FLU A&B, COVID) [id number]  PROTIME-INR  PHOSPHORUS  LACTIC ACID, PLASMA  RAPID URINE DRUG SCREEN, HOSP PERFORMED  VITAMIN B1  MAGNESIUM  SAMPLE TO BLOOD BANK  TROPONIN I (HIGH SENSITIVITY)  TROPONIN I (HIGH SENSITIVITY)    EKG EKG Interpretation  Date/Time:  Thursday December 24 2021 10:11:12 EDT Ventricular Rate:  73 PR Interval:  115 QRS Duration: 110 QT Interval:  422 QTC Calculation: 465 R Axis:   25 Text Interpretation: Sinus rhythm Borderline short PR interval Borderline low voltage, extremity leads Confirmed by Alona Bene [phone]) on 12/24/2021 11:20:17 AM  Radiology DG Wrist Complete Left  Result Date: 12/24/2021 CLINICAL DATA:  Left wrist pain. EXAM: LEFT WRIST - COMPLETE 3+ VIEW COMPARISON:  None Available. FINDINGS: Generalized osteopenia. Comminuted impacted displaced fracture of the tip of the ulnar styloid process. Displaced fracture of the distal radial metaphysis extending to the articular surface. No aggressive osseous lesion. Normal alignment. Severe osteoarthritis of the first [facility] joint. Moderate osteoarthritis of the first MCP joint and first IP joint. 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 Soft  tissue are unremarkable. No radiopaque foreign body or soft tissue emphysema. IMPRESSION: 1. Comminuted impacted displaced fracture of the tip of the ulnar styloid process. 2. Displaced fracture of the distal radial metaphysis extending to the articular surface. Electronically Signed   By: Elige Ko M.D.   On: 12/24/2021 12:09   DG Wrist Complete Left  Result Date: 12/24/2021 CLINICAL DATA:  Left wrist pain. EXAM: LEFT WRIST - COMPLETE 3+ VIEW COMPARISON:  None Available. FINDINGS: Generalized  osteopenia. Comminuted impacted displaced fracture of the tip of the ulnar styloid process. Displaced fracture of the distal radial metaphysis extending to the articular surface. No aggressive osseous lesion. Normal alignment. Severe osteoarthritis of the first [facility] joint. Moderate osteoarthritis of the first MCP joint and first IP joint. 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 Soft tissue are unremarkable. No radiopaque foreign body or soft tissue emphysema. IMPRESSION: 1. Comminuted impacted displaced fracture of the tip of the ulnar styloid process. 2. Displaced fracture of the distal radial metaphysis extending to the articular surface. Electronically Signed   By: Elige Ko M.D.   On: 12/24/2021 12:09   CT HEAD WO CONTRAST  Result Date: 12/24/2021 CLINICAL DATA:  Provided history: Facial trauma, blunt. Head trauma, moderate/severe. Neck trauma, abnormal mental status or neuro exam. Fall. EXAM: CT HEAD WITHOUT CONTRAST CT MAXILLOFACIAL WITHOUT CONTRAST CT CERVICAL SPINE WITHOUT CONTRAST TECHNIQUE: Multidetector CT imaging of the head, cervical spine, and maxillofacial structures were performed using the standard protocol without intravenous contrast. Multiplanar CT image reconstructions of the cervical spine and maxillofacial structures were also generated. RADIATION DOSE REDUCTION: This exam was performed according to the departmental dose-optimization program which includes automated exposure control, adjustment of the mA and/or kV according to patient size and/or use of iterative reconstruction technique. COMPARISON:  CT head/cervical spine 10/15/2016. FINDINGS: CT HEAD FINDINGS Brain: Mild generalized cerebral atrophy. There is no acute intracranial hemorrhage. No demarcated cortical infarct. No extra-axial fluid collection. No evidence of an intracranial mass. No midline shift. Vascular: No hyperdense vessel. Atherosclerotic calcifications. Skull: No fracture or aggressive osseous  lesion. Other: Left anterior scalp hematoma and laceration. CT MAXILLOFACIAL FINDINGS Osseous: No evidence of acute maxillofacial fracture. Anterior position of the mandibular condyles, bilaterally. Orbits: No acute orbital finding. Sinuses: Minimal mucosal thickening within the anterior ethmoid air cells, bilaterally. Soft tissues: No significant maxillofacial soft tissue swelling appreciable by CT. CT CERVICAL SPINE FINDINGS Mildly motion degraded exam. Alignment: Straightening of the expected cervical lordosis. Slight C2-C3 grade 1 anterolisthesis. 4 mm C7-T1 grade 1 anterolisthesis. Skull base and vertebrae: The basion-dental and atlanto-dental intervals are maintained.No evidence of acute fracture to the cervical spine. Soft tissues and spinal canal: No prevertebral fluid or swelling. No visible canal hematoma. Disc levels: Cervical spondylosis with multilevel disc space narrowing, disc bulges, posterior disc osteophyte complexes, endplate spurring, uncovertebral hypertrophy and facet arthrosis. Disc space narrowing is greatest at C3-C4, C4-C5, C5-C6, C6-C7 and C7-T1 (advanced at these levels). No appreciable high-grade spinal canal stenosis. Multilevel bony neural foraminal narrowing. Degenerative changes about the C1-C2 articulation. Upper chest: No consolidation within the imaged lung apices. No visible pneumothorax. IMPRESSION: CT head: 1. No evidence of acute intracranial abnormality. 2. Left anterior scalp hematoma and laceration. 3. Mild generalized cerebral atrophy. CT maxillofacial: 1. No evidence of acute maxillofacial fracture. 2. Anterior position of the bilateral mandibular condyles. Correlate with physical exam findings to exclude TMJ dislocation. CT cervical spine: 1. Mildly motion degraded exam. 2. No evidence of acute fracture to the  cervical spine. 3. Grade 1 anterolisthesis at C2-C3 and C7-T1. 4. Cervical spondylosis, as described. Electronically Signed   By: Jackey Loge D.O.   On:  12/24/2021 11:30   CT MAXILLOFACIAL WO CONTRAST  Result Date: 12/24/2021 CLINICAL DATA:  Provided history: Facial trauma, blunt. Head trauma, moderate/severe. Neck trauma, abnormal mental status or neuro exam. Fall. EXAM: CT HEAD WITHOUT CONTRAST CT MAXILLOFACIAL WITHOUT CONTRAST CT CERVICAL SPINE WITHOUT CONTRAST TECHNIQUE: Multidetector CT imaging of the head, cervical spine, and maxillofacial structures were performed using the standard protocol without intravenous contrast. Multiplanar CT image reconstructions of the cervical spine and maxillofacial structures were also generated. RADIATION DOSE REDUCTION: This exam was performed according to the departmental dose-optimization program which includes automated exposure control, adjustment of the mA and/or kV according to patient size and/or use of iterative reconstruction technique. COMPARISON:  CT head/cervical spine 10/15/2016. FINDINGS: CT HEAD FINDINGS Brain: Mild generalized cerebral atrophy. There is no acute intracranial hemorrhage. No demarcated cortical infarct. No extra-axial fluid collection. No evidence of an intracranial mass. No midline shift. Vascular: No hyperdense vessel. Atherosclerotic calcifications. Skull: No fracture or aggressive osseous lesion. Other: Left anterior scalp hematoma and laceration. CT MAXILLOFACIAL FINDINGS Osseous: No evidence of acute maxillofacial fracture. Anterior position of the mandibular condyles, bilaterally. Orbits: No acute orbital finding. Sinuses: Minimal mucosal thickening within the anterior ethmoid air cells, bilaterally. Soft tissues: No significant maxillofacial soft tissue swelling appreciable by CT. CT CERVICAL SPINE FINDINGS Mildly motion degraded exam. Alignment: Straightening of the expected cervical lordosis. Slight C2-C3 grade 1 anterolisthesis. 4 mm C7-T1 grade 1 anterolisthesis. Skull base and vertebrae: The basion-dental and atlanto-dental intervals are maintained.No evidence of acute fracture  to the cervical spine. Soft tissues and spinal canal: No prevertebral fluid or swelling. No visible canal hematoma. Disc levels: Cervical spondylosis with multilevel disc space narrowing, disc bulges, posterior disc osteophyte complexes, endplate spurring, uncovertebral hypertrophy and facet arthrosis. Disc space narrowing is greatest at C3-C4, C4-C5, C5-C6, C6-C7 and C7-T1 (advanced at these levels). No appreciable high-grade spinal canal stenosis. Multilevel bony neural foraminal narrowing. Degenerative changes about the C1-C2 articulation. Upper chest: No consolidation within the imaged lung apices. No visible pneumothorax. IMPRESSION: CT head: 1. No evidence of acute intracranial abnormality. 2. Left anterior scalp hematoma and laceration. 3. Mild generalized cerebral atrophy. CT maxillofacial: 1. No evidence of acute maxillofacial fracture. 2. Anterior position of the bilateral mandibular condyles. Correlate with physical exam findings to exclude TMJ dislocation. CT cervical spine: 1. Mildly motion degraded exam. 2. No evidence of acute fracture to the cervical spine. 3. Grade 1 anterolisthesis at C2-C3 and C7-T1. 4. Cervical spondylosis, as described. Electronically Signed   By: Jackey Loge D.O.   On: 12/24/2021 11:30   CT Cervical Spine Wo Contrast  Result Date: 12/24/2021 CLINICAL DATA:  Provided history: Facial trauma, blunt. Head trauma, moderate/severe. Neck trauma, abnormal mental status or neuro exam. Fall. EXAM: CT HEAD WITHOUT CONTRAST CT MAXILLOFACIAL WITHOUT CONTRAST CT CERVICAL SPINE WITHOUT CONTRAST TECHNIQUE: Multidetector CT imaging of the head, cervical spine, and maxillofacial structures were performed using the standard protocol without intravenous contrast. Multiplanar CT image reconstructions of the cervical spine and maxillofacial structures were also generated. RADIATION DOSE REDUCTION: This exam was performed according to the departmental dose-optimization program which includes  automated exposure control, adjustment of the mA and/or kV according to patient size and/or use of iterative reconstruction technique. COMPARISON:  CT head/cervical spine 10/15/2016. FINDINGS: CT HEAD FINDINGS Brain: Mild generalized cerebral atrophy. There is no acute intracranial  hemorrhage. No demarcated cortical infarct. No extra-axial fluid collection. No evidence of an intracranial mass. No midline shift. Vascular: No hyperdense vessel. Atherosclerotic calcifications. Skull: No fracture or aggressive osseous lesion. Other: Left anterior scalp hematoma and laceration. CT MAXILLOFACIAL FINDINGS Osseous: No evidence of acute maxillofacial fracture. Anterior position of the mandibular condyles, bilaterally. Orbits: No acute orbital finding. Sinuses: Minimal mucosal thickening within the anterior ethmoid air cells, bilaterally. Soft tissues: No significant maxillofacial soft tissue swelling appreciable by CT. CT CERVICAL SPINE FINDINGS Mildly motion degraded exam. Alignment: Straightening of the expected cervical lordosis. Slight C2-C3 grade 1 anterolisthesis. 4 mm C7-T1 grade 1 anterolisthesis. Skull base and vertebrae: The basion-dental and atlanto-dental intervals are maintained.No evidence of acute fracture to the cervical spine. Soft tissues and spinal canal: No prevertebral fluid or swelling. No visible canal hematoma. Disc levels: Cervical spondylosis with multilevel disc space narrowing, disc bulges, posterior disc osteophyte complexes, endplate spurring, uncovertebral hypertrophy and facet arthrosis. Disc space narrowing is greatest at C3-C4, C4-C5, C5-C6, C6-C7 and C7-T1 (advanced at these levels). No appreciable high-grade spinal canal stenosis. Multilevel bony neural foraminal narrowing. Degenerative changes about the C1-C2 articulation. Upper chest: No consolidation within the imaged lung apices. No visible pneumothorax. IMPRESSION: CT head: 1. No evidence of acute intracranial abnormality. 2. Left  anterior scalp hematoma and laceration. 3. Mild generalized cerebral atrophy. CT maxillofacial: 1. No evidence of acute maxillofacial fracture. 2. Anterior position of the bilateral mandibular condyles. Correlate with physical exam findings to exclude TMJ dislocation. CT cervical spine: 1. Mildly motion degraded exam. 2. No evidence of acute fracture to the cervical spine. 3. Grade 1 anterolisthesis at C2-C3 and C7-T1. 4. Cervical spondylosis, as described. Electronically Signed   By: Jackey Loge D.O.   On: 12/24/2021 11:30   CT CHEST ABDOMEN PELVIS W CONTRAST  Result Date: 12/24/2021 CLINICAL DATA:  Poly trauma, fall, head injury EXAM: CT CHEST, ABDOMEN, AND PELVIS WITH CONTRAST TECHNIQUE: Multidetector CT imaging of the chest, abdomen and pelvis was performed following the standard protocol during bolus administration of intravenous contrast. RADIATION DOSE REDUCTION: This exam was performed according to the departmental dose-optimization program which includes automated exposure control, adjustment of the mA and/or kV according to patient size and/or use of iterative reconstruction technique. CONTRAST:  75mL OMNIPAQUE IOHEXOL 350 MG/ML SOLN IV. No oral contrast. COMPARISON:  Hip CT 05/16/2021 FINDINGS: CT CHEST FINDINGS Cardiovascular: Atherosclerotic calcifications aorta. Aorta normal caliber. Heart unremarkable. No pericardial effusion. Mediastinum/Nodes: Esophagus unremarkable. Probable small hiatal hernia. Capsular calcification at BILATERAL breast prostheses. No thoracic adenopathy. Base of cervical region normal appearance. Lungs/Pleura: Minimal bibasilar atelectasis versus scarring. Minimal biapical scarring. Lungs otherwise clear. No acute infiltrate, pleural effusion, or pneumothorax. Musculoskeletal: Diffuse osseous demineralization. Multiple old healed LEFT rib fractures. RIGHT glenohumeral degenerative changes. Chronic appearing compression deformity of T7 vertebral body. CT ABDOMEN PELVIS  FINDINGS Hepatobiliary: Gallbladder and liver normal appearance Pancreas: Normal appearance Spleen: Normal appearance Adrenals/Urinary Tract: Adrenal glands, kidneys, ureters, and bladder normal appearance. Stomach/Bowel: Appendix not visualized. Diverticulosis of descending and sigmoid colon without evidence of diverticulitis. Bowel loops otherwise unremarkable. Vascular/Lymphatic: Atherosclerotic calcifications aorta and iliac arteries without aneurysm. No adenopathy. Reproductive: Uterus surgically absent.  Unremarkable adnexa. Other: No free air or free fluid. Tiny umbilical hernia containing fat. No inflammatory process. Musculoskeletal: RIGHT hip prosthesis. Osseous demineralization. Subacute healing fractures of the LEFT pubic bones. 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 IMPRESSION: No acute intrathoracic, intra-abdominal, or intrapelvic abnormalities. Distal colonic diverticulosis without evidence of diverticulitis. Tiny  umbilical hernia containing fat. Subacute healing fractures of the LEFT pubic bones. Chronic appearing compression deformity of T7 vertebral body. Aortic Atherosclerosis (ICD10-I70.0). Electronically Signed   By: Ulyses Southward M.D.   On: 12/24/2021 11:23   DG Pelvis Portable  Result Date: 12/24/2021 CLINICAL DATA:  Unwitnessed fall EXAM: PORTABLE PELVIS 1-2 VIEWS COMPARISON:  CT 05/16/2021, 05/15/2021 FINDINGS: Prior right hip hemiarthroplasty. Subacute fractures of the bilateral sacral ala and left superior and inferior pubic rami. No definite acute fracture. No pelvic diastasis. IMPRESSION: Subacute fractures of the bilateral sacral ala and left superior and inferior pubic rami. No definite acute fracture. Electronically Signed   By: Duanne Guess D.O.   On: 12/24/2021 10:49   DG Chest Port 1 View  Result Date: 12/24/2021 CLINICAL DATA:  Unwitnessed fall EXAM: PORTABLE CHEST 1 VIEW COMPARISON:  05/17/2021 FINDINGS: Stable  cardiomediastinal contours. Aortic atherosclerosis. No focal airspace consolidation, pleural effusion, or pneumothorax. Remote appearing bilateral rib deformities. Bilateral breast prostheses. IMPRESSION: No active disease. Electronically Signed   By: Duanne Guess D.O.   On: 12/24/2021 10:47    Procedures .Ortho Injury Treatment  Date/Time: 12/24/2021 11:43 AM  Performed by: Arthor Captain, PA-C Authorized by: Arthor Captain, PA-C   Consent:    Consent obtained:  Verbal   Consent given by:  Patient   Risks discussed:  Fracture   Alternatives discussed:  No treatmentInjury location: wrist Location details: left wrist Injury type: fracture-dislocation Pre-procedure neurovascular assessment: neurovascularly intact Pre-procedure distal perfusion: normal Pre-procedure neurological function: normal Pre-procedure range of motion: reduced Anesthesia: hematoma block  Anesthesia: Local anesthesia used: yes Local Anesthetic: lidocaine 2% with epinephrine Anesthetic total: 5 mL Manipulation performed: yes Skeletal traction used: yes Immobilization: splint Splint type: sugar tong Splint Applied by: Ortho Tech Supplies used: Ortho-Glass, elastic bandage and cotton padding Post-procedure neurovascular assessment: post-procedure neurovascularly intact Post-procedure distal perfusion: normal Post-procedure neurological function: normal       Medications Ordered in ED Medications  LORazepam (ATIVAN) injection 0-4 mg ( Intravenous Not Given 12/25/21 0708)    Or  LORazepam (ATIVAN) tablet 0-4 mg ( Oral See Alternative 12/25/21 0708)  LORazepam (ATIVAN) injection 0-4 mg (has no administration in time range)    Or  LORazepam (ATIVAN) tablet 0-4 mg (has no administration in time range)  thiamine (VITAMIN B1) tablet 100 mg (100 mg Oral Given 12/25/21 0929)    Or  thiamine (VITAMIN B1) injection 100 mg ( Intravenous See Alternative 12/25/21 0929)  acetaminophen (TYLENOL) tablet 650 mg (650  mg Oral Given 12/24/21 2018)    Or  acetaminophen (TYLENOL) suppository 650 mg ( Rectal See Alternative 12/24/21 2018)  naloxone [facility]) injection 0.4 mg (has no administration in time range)  ondansetron (ZOFRAN) injection 4 mg (has no administration in time range)  folic acid (FOLVITE) tablet 1 mg (1 mg Oral Given 12/25/21 0929)  multivitamin (PROSIGHT) tablet 1 tablet (1 tablet Oral Given 12/25/21 1039)  lactated ringers infusion (0 mLs Intravenous Stopped 12/25/21 0826)  HYDROmorphone (DILAUDID) injection 0.5 mg (0.5 mg Intravenous Given 12/25/21 0936)  citalopram (CELEXA) tablet 20 mg (20 mg Oral Given 12/25/21 0929)  ezetimibe (ZETIA) tablet 10 mg (has no administration in time range)  0.9 %  sodium chloride infusion ( Intravenous New Bag/Given 12/25/21 0827)  Tdap (BOOSTRIX) injection 0.5 mL (0.5 mLs Intramuscular Given 12/24/21 1116)  fentaNYL (SUBLIMAZE) injection 50 mcg (50 mcg Intravenous Given 12/24/21 1032)  ondansetron (ZOFRAN) injection 4 mg (4 mg Intravenous Given 12/24/21 1033)  lidocaine-EPINEPHrine (XYLOCAINE W/EPI) 1 %-1:100000 (  with pres) injection 20 mL (20 mLs Intradermal Given by Other 12/24/21 1040)  lidocaine-EPINEPHrine (XYLOCAINE W/EPI) 2 %-1:200000 (PF) injection (20 mLs  Given 12/24/21 1039)  iohexol (OMNIPAQUE) 350 MG/ML injection 100 mL (75 mLs Intravenous Contrast Given 12/24/21 1105)  lactated ringers bolus 1,000 mL (0 mLs Intravenous Stopped 12/24/21 1646)  sodium chloride 0.9 % bolus 1,000 mL (0 mLs Intravenous Stopped 12/24/21 1812)  HYDROmorphone (DILAUDID) injection 0.5 mg (0.5 mg Intravenous Given 12/25/21 0439)  magnesium sulfate IVPB 2 g 50 mL (0 g Intravenous Stopped 12/25/21 0930)    ED Course/ Medical Decision Making/ A&P Clinical Course as of 12/25/21 1139  Thu Dec 24, 2021  1051 Level trauma The emergent differential diagnosis for trauma is extensive and requires complex medical decision making. The differential includes, but is not limited to traumatic brain  injury, Orbital trauma, maxillofacial trauma, skull fracture, blunt/penetrating neck trauma, vertebral artery dissection, whiplash, cervical fracture, neurogenic shock, spinal cord injury, thoracic trauma (blunt/penetrating) cardiac trauma, thoracic and lumbar spine trauma. Abdominal trauma (blunt. Penetrating), genitourinary trauma, extremity fractures, skin lacerations/ abrasions, vascular injuries.   [AH]  1052 I visualized bedside chest and pelvis. I interpreted images. No Obvious pelvic fractures No PTX , + rib fractures anterior wall left side. Will defer to radiologist for full interpretation.  [AH]  1053 Pain meds ordered. Head laceration reparied at bedside by Dr. Laverta Baltimore (see his note). Patien has an obvious wrist deformity. Marland Kitchen + intermittent hypoxia- suspect this is due to chest wall pain [AH]  1111 Hemoglobin: 12.3 [AH]  1118 MCV(!): 109.7 [AH]  1137 I visualized and interpreted ct C/A/P w/ contrast- no acute findings [AH]  1145 AST(!): 45 [AH]  1146 CT HEAD WO CONTRAST [AH]  1146 CT MAXILLOFACIAL WO CONTRAST [AH]  1146 CT Cervical Spine Wo Contrast I visualized and intepreted ct head/ cspine/ max. No acute findings [AH]  1359 Alcohol, Ethyl (B)(!): 191 [AH]  1359 Temp(!): 96.7 F (35.9 C) [AH]  1359 Pulse Rate(!): 102 [AH]  1400 ECG Heart Rate(!): 104 Case discusse with Dr. Greta Doom . Will see inpatient if admitted, or in clinic if dc'd [AH]  1503 Patient is now more alert and oriented.  She states that she was on her way to get her hair done and reached for her cane but thinks she missed that and that is what caused her fall.  I discussed her ethanol level.  Patient states that she drank heavily with friends last night and did not go to bed until 3 AM.  But that she "only drinks red wine."  She is amnestic to the events this morning otherwise.  Her Son states that he does not know of any history of alcohol abuse. [AH]  1547 Patient persistently tachycardic.  Now hypertensive.  Second  lactic acid is pending.  Patient also has an elevated MCV which is suggestive of more chronic alcohol use. I question if patient may be in withdrawal.  I have added on a troponin to make sure she does not have any evidence of cardiac contusion.  She has already gotten a bolus of fluid. [AH]    Clinical Course User Index [AH] Margarita Mail, PA-C                           Medical Decision Making Amount and/or Complexity of Data Reviewed Independent Historian: EMS    Details: stepson Labs: ordered. Decision-making details documented in ED Course. Radiology: ordered and independent interpretation performed.  Decision-making details documented in ED Course. ECG/medicine tests: ordered and independent interpretation performed.  Risk OTC drugs. Prescription drug management. Decision regarding hospitalization.   76 year old female who presents after fall, made a level 2 trauma.  Patient was altered with repetitive questioning and confusion.  Initially had concern for head injury or concussion however altered mental status was confounded by an alcohol level of almost 200 at early in the morning. Patient also had a lactic acidosis which I treated with fluids and has been improving.  Patient persistently tachycardic.  I had consideration for cardiac contusion however patient's troponins are negative and there is no evidence of any other contusions on CT imaging.  EKG does not suggest cardiac contusion.  Secondly patient may be in alcohol withdrawal however as I have been trying to get the patient's mental status to improve I had not given Ativan.  Patient started on CIWA protocol.  Case discussed with outside providers including Dr. Sheliah HatchKinsinger of trauma as well as Dr. Yehuda BuddSpears of hand surgery.  I have discussed the case with Dr. Dimas AguasHoward her who will bring the patient in overnight for observation, potential withdrawal treatment, pain control.        Final Clinical Impression(s) / ED Diagnoses Final  diagnoses:  Fall (on) (from) other stairs and steps, initial encounter  Laceration of scalp without foreign body, initial encounter  Closed fracture of left wrist, initial encounter  Rib pain  Alcoholic intoxication with complication (HCC)  Altered mental status, unspecified altered mental status type  Tachycardia  Elevated MCV    Rx / DC Orders ED Discharge Orders     None         Arthor CaptainHarris, Keylee Shrestha, PA-C 12/25/21 1144    Long, Arlyss RepressJoshua G, MD 12/28/21 [phone]

## 2021-12-24 NOTE — ED Notes (Addendum)
Pt ambulated to and from bathroom with steady gait. Oxygen saturation remained above 95% and HR ranged from 110-125. PA notified.

## 2021-12-24 NOTE — ED Triage Notes (Signed)
Pt BIB [facility]. Pt was found on on the ground near some steps. Pt seemed to have fallen down a couple of steps. There were 4 steps next to where she was found. Pt unable to explain what happened. EMS reports she was A/O when they first arrived but while in the ambulance pt began to ask questions that did not make any sense and had repetitive questions. Pt has lac to her left forward.

## 2021-12-25 ENCOUNTER — Other Ambulatory Visit: Payer: Self-pay

## 2021-12-25 ENCOUNTER — Encounter (HOSPITAL_COMMUNITY): Payer: Self-pay | Admitting: Internal Medicine

## 2021-12-25 DIAGNOSIS — Z9851 Tubal ligation status: Secondary | ICD-10-CM | POA: Diagnosis not present

## 2021-12-25 DIAGNOSIS — S52302A Unspecified fracture of shaft of left radius, initial encounter for closed fracture: Secondary | ICD-10-CM | POA: Diagnosis present

## 2021-12-25 DIAGNOSIS — E860 Dehydration: Secondary | ICD-10-CM | POA: Diagnosis present

## 2021-12-25 DIAGNOSIS — Y906 Blood alcohol level of 120-199 mg/100 ml: Secondary | ICD-10-CM | POA: Diagnosis not present

## 2021-12-25 DIAGNOSIS — Z90710 Acquired absence of both cervix and uterus: Secondary | ICD-10-CM | POA: Diagnosis not present

## 2021-12-25 DIAGNOSIS — W19XXXA Unspecified fall, initial encounter: Secondary | ICD-10-CM

## 2021-12-25 DIAGNOSIS — E872 Acidosis, unspecified: Secondary | ICD-10-CM | POA: Diagnosis present

## 2021-12-25 DIAGNOSIS — F1010 Alcohol abuse, uncomplicated: Secondary | ICD-10-CM | POA: Diagnosis present

## 2021-12-25 DIAGNOSIS — M7582 Other shoulder lesions, left shoulder: Secondary | ICD-10-CM | POA: Diagnosis present

## 2021-12-25 DIAGNOSIS — G9340 Encephalopathy, unspecified: Secondary | ICD-10-CM | POA: Diagnosis present

## 2021-12-25 DIAGNOSIS — W109XXA Fall (on) (from) unspecified stairs and steps, initial encounter: Secondary | ICD-10-CM | POA: Diagnosis not present

## 2021-12-25 DIAGNOSIS — R0789 Other chest pain: Secondary | ICD-10-CM | POA: Diagnosis present

## 2021-12-25 DIAGNOSIS — M19012 Primary osteoarthritis, left shoulder: Secondary | ICD-10-CM | POA: Diagnosis present

## 2021-12-25 DIAGNOSIS — F32A Depression, unspecified: Secondary | ICD-10-CM | POA: Diagnosis present

## 2021-12-25 DIAGNOSIS — Z23 Encounter for immunization: Secondary | ICD-10-CM | POA: Diagnosis present

## 2021-12-25 DIAGNOSIS — S62102A Fracture of unspecified carpal bone, left wrist, initial encounter for closed fracture: Secondary | ICD-10-CM | POA: Diagnosis present

## 2021-12-25 DIAGNOSIS — E785 Hyperlipidemia, unspecified: Secondary | ICD-10-CM | POA: Diagnosis present

## 2021-12-25 DIAGNOSIS — G9341 Metabolic encephalopathy: Secondary | ICD-10-CM | POA: Diagnosis present

## 2021-12-25 DIAGNOSIS — R000 Tachycardia, unspecified: Secondary | ICD-10-CM | POA: Diagnosis present

## 2021-12-25 DIAGNOSIS — S32119A Unspecified Zone I fracture of sacrum, initial encounter for closed fracture: Secondary | ICD-10-CM | POA: Diagnosis present

## 2021-12-25 DIAGNOSIS — S52612A Displaced fracture of left ulna styloid process, initial encounter for closed fracture: Secondary | ICD-10-CM | POA: Diagnosis present

## 2021-12-25 DIAGNOSIS — S0101XA Laceration without foreign body of scalp, initial encounter: Secondary | ICD-10-CM | POA: Diagnosis not present

## 2021-12-25 DIAGNOSIS — F10929 Alcohol use, unspecified with intoxication, unspecified: Secondary | ICD-10-CM | POA: Diagnosis present

## 2021-12-25 DIAGNOSIS — Z79899 Other long term (current) drug therapy: Secondary | ICD-10-CM | POA: Diagnosis not present

## 2021-12-25 DIAGNOSIS — Z1152 Encounter for screening for COVID-19: Secondary | ICD-10-CM | POA: Diagnosis not present

## 2021-12-25 DIAGNOSIS — Z96641 Presence of right artificial hip joint: Secondary | ICD-10-CM | POA: Diagnosis present

## 2021-12-25 DIAGNOSIS — I959 Hypotension, unspecified: Secondary | ICD-10-CM | POA: Diagnosis not present

## 2021-12-25 DIAGNOSIS — F10129 Alcohol abuse with intoxication, unspecified: Secondary | ICD-10-CM | POA: Diagnosis present

## 2021-12-25 DIAGNOSIS — S32592A Other specified fracture of left pubis, initial encounter for closed fracture: Secondary | ICD-10-CM | POA: Diagnosis present

## 2021-12-25 LAB — CBC WITH DIFFERENTIAL/PLATELET
Abs Immature Granulocytes: 0.03 10*3/uL (ref 0.00–0.07)
Basophils Absolute: 0.1 10*3/uL (ref 0.0–0.1)
Basophils Relative: 1 %
Eosinophils Absolute: 0 10*3/uL (ref 0.0–0.5)
Eosinophils Relative: 0 %
HCT: 31.3 % — ABNORMAL LOW (ref 36.0–46.0)
Hemoglobin: 10.6 g/dL — ABNORMAL LOW (ref 12.0–15.0)
Immature Granulocytes: 1 %
Lymphocytes Relative: 20 %
Lymphs Abs: 1 10*3/uL (ref 0.7–4.0)
MCH: 37.2 pg — ABNORMAL HIGH (ref 26.0–34.0)
MCHC: 33.9 g/dL (ref 30.0–36.0)
MCV: 109.8 fL — ABNORMAL HIGH (ref 80.0–100.0)
Monocytes Absolute: 0.7 10*3/uL (ref 0.1–1.0)
Monocytes Relative: 14 %
Neutro Abs: 3.2 10*3/uL (ref 1.7–7.7)
Neutrophils Relative %: 64 %
Platelets: 205 10*3/uL (ref 150–400)
RBC: 2.85 MIL/uL — ABNORMAL LOW (ref 3.87–5.11)
RDW: 13.2 % (ref 11.5–15.5)
WBC: 5 10*3/uL (ref 4.0–10.5)
nRBC: 0 % (ref 0.0–0.2)

## 2021-12-25 LAB — COMPREHENSIVE METABOLIC PANEL
ALT: 21 U/L (ref 0–44)
AST: 31 U/L (ref 15–41)
Albumin: 3 g/dL — ABNORMAL LOW (ref 3.5–5.0)
Alkaline Phosphatase: 43 U/L (ref 38–126)
Anion gap: 10 (ref 5–15)
BUN: 7 mg/dL — ABNORMAL LOW (ref 8–23)
CO2: 24 mmol/L (ref 22–32)
Calcium: 8.7 mg/dL — ABNORMAL LOW (ref 8.9–10.3)
Chloride: 103 mmol/L (ref 98–111)
Creatinine, Ser: 0.72 mg/dL (ref 0.44–1.00)
GFR, Estimated: >60 mL/min (ref >60)
Glucose, Bld: 106 mg/dL — ABNORMAL HIGH (ref 70–99)
Potassium: 4.2 mmol/L (ref 3.5–5.1)
Sodium: 137 mmol/L (ref 135–145)
Total Bilirubin: 1 mg/dL (ref 0.3–1.2)
Total Protein: 5.6 g/dL — ABNORMAL LOW (ref 6.5–8.1)

## 2021-12-25 LAB — LACTIC ACID, PLASMA: Lactic Acid, Venous: 1.2 mmol/L (ref 0.5–1.9)

## 2021-12-25 LAB — MAGNESIUM: Magnesium: 1.6 mg/dL — ABNORMAL LOW (ref 1.7–2.4)

## 2021-12-25 LAB — PHOSPHORUS: Phosphorus: 3.4 mg/dL (ref 2.5–4.6)

## 2021-12-25 MED ORDER — MAGNESIUM SULFATE 2 GM/50ML IV SOLN
2.0000 g | Freq: Once | INTRAVENOUS | Status: AC
  Administered 2021-12-25: 2 g via INTRAVENOUS
  Filled 2021-12-25: qty 50

## 2021-12-25 MED ORDER — HYDROMORPHONE HCL 1 MG/ML IJ SOLN
0.5000 mg | INTRAMUSCULAR | Status: DC | PRN
  Administered 2021-12-25 – 2021-12-28 (×17): 0.5 mg via INTRAVENOUS
  Filled 2021-12-25: qty 1
  Filled 2021-12-25: qty 0.5
  Filled 2021-12-25: qty 1
  Filled 2021-12-25 (×2): qty 0.5
  Filled 2021-12-25: qty 1
  Filled 2021-12-25 (×5): qty 0.5
  Filled 2021-12-25: qty 1
  Filled 2021-12-25 (×7): qty 0.5

## 2021-12-25 MED ORDER — EZETIMIBE 10 MG PO TABS
10.0000 mg | ORAL_TABLET | Freq: Every day | ORAL | Status: DC
  Administered 2021-12-25 – 2021-12-28 (×4): 10 mg via ORAL
  Filled 2021-12-25 (×5): qty 1

## 2021-12-25 MED ORDER — CITALOPRAM HYDROBROMIDE 20 MG PO TABS
20.0000 mg | ORAL_TABLET | Freq: Every day | ORAL | Status: DC
  Administered 2021-12-25 – 2021-12-29 (×5): 20 mg via ORAL
  Filled 2021-12-25: qty 1
  Filled 2021-12-25: qty 2
  Filled 2021-12-25 (×3): qty 1

## 2021-12-25 MED ORDER — HYDROMORPHONE HCL 1 MG/ML IJ SOLN
0.5000 mg | Freq: Once | INTRAMUSCULAR | Status: AC
  Administered 2021-12-25: 0.5 mg via INTRAVENOUS
  Filled 2021-12-25: qty 1

## 2021-12-25 MED ORDER — SODIUM CHLORIDE 0.9 % IV SOLN: INTRAVENOUS | Status: DC

## 2021-12-25 NOTE — Progress Notes (Signed)
Physical Therapy Evaluation Patient Details Name: Jasmine Benson MRN: [id number] DOB: 27-Jul-1945 Today's Date: 12/25/2021  History of Present Illness  76 yo female admitted on 11/2 was found to have comminuted impacted displaced fracture of the tip of the ulnar styloid process, displaced fracture of the distal radial metaphysis extending to the articular surface.  Non surgical management in splint, awaiting need for surgery.  PMHx:  EtOH, falls  Clinical Impression  Pt was seen for mobility on hallway with help, and note her difficulty moving with no WB on LUE.  Pt is in pain, but refusing to consider SNF placement as recommended.  Pt has drop in help of her step son, has some equipment and has level environment but could not even unwrap her cough drop during PT session.  Will recommend SNF to recover strength and independence as well as ask for help in managing her daily tasks with modified techniques.  OT consulted acutely since pt is likely to refuse SNF offer if given.       Recommendations for follow up therapy are one component of a multi-disciplinary discharge planning process, led by the attending physician.  Recommendations may be updated based on patient status, additional functional criteria and insurance authorization.  Follow Up Recommendations Skilled nursing-short term rehab (<3 hours/day) Can patient physically be transported by private vehicle: No    Assistance Recommended at Discharge Frequent or constant Supervision/Assistance  Patient can return home with the following  A little help with walking and/or transfers;A little help with bathing/dressing/bathroom;Assistance with cooking/housework;Direct supervision/assist for medications management;Direct supervision/assist for financial management;Assist for transportation;Help with stairs or ramp for entrance    Equipment Recommendations Rolling walker (2 wheels) (platform attachment for LUE)  Recommendations for Other  Services       Functional Status Assessment Patient has had a recent decline in their functional status and demonstrates the ability to make significant improvements in function in a reasonable and predictable amount of time.     Precautions / Restrictions Precautions Precautions: Fall Precaution Comments: WB through elbow only LUE Required Braces or Orthoses: Splint/Cast Splint/Cast: L wrist Restrictions Weight Bearing Restrictions: Yes LUE Weight Bearing: Weight bear through elbow only Other Position/Activity Restrictions: may use platform      Mobility  Bed Mobility Overal bed mobility: Needs Assistance Bed Mobility: Supine to Sit, Sit to Supine     Supine to sit: Mod assist Sit to supine: Mod assist   General bed mobility comments: mod assist due to pain and struggle with LUE and ribcage pain    Transfers Overall transfer level: Needs assistance Equipment used: 1 person hand held assist Transfers: Sit to/from Stand Sit to Stand: Min assist           General transfer comment: maneuvering with HHA and avoiding WB on LUE    Ambulation/Gait Ambulation/Gait assistance: Min assist Gait Distance (Feet): 100 Feet Assistive device: 1 person hand held assist Gait Pattern/deviations: Step-through pattern, Decreased stride length, Wide base of support Gait velocity: reduced Gait velocity interpretation: <1.31 ft/sec, indicative of household ambulator Pre-gait activities: standing balance skills General Gait Details: pt is weak and having trouble with controlling balance, but unable to use LUE due to lack of permission for walker with platform  Stairs            Wheelchair Mobility    Modified Rankin (Stroke Patients Only)       Balance Overall balance assessment: Needs assistance Sitting-balance support: Feet supported Sitting balance-Leahy Scale: Fair  Standing balance support: Single extremity supported, During functional activity Standing  balance-Leahy Scale: Fair Standing balance comment: less than fair dynamically                             Pertinent Vitals/Pain Pain Assessment Pain Assessment: Faces Faces Pain Scale: Hurts even more Pain Location: L arm and ribcage Pain Descriptors / Indicators: Aching, Grimacing, Guarding Pain Intervention(s): Limited activity within patient's tolerance, Monitored during session, Premedicated before session, Repositioned    Home Living Family/patient expects to be discharged to:: Skilled nursing facility                   Additional Comments: pt had no family with her and was unable to give all home details    Prior Function Prior Level of Function : Independent/Modified Independent             Mobility Comments: was on RW with stepson assisting at home       Hand Dominance   Dominant Hand: Right    Extremity/Trunk Assessment   Upper Extremity Assessment Upper Extremity Assessment: Defer to OT evaluation    Lower Extremity Assessment Lower Extremity Assessment: Generalized weakness    Cervical / Trunk Assessment Cervical / Trunk Assessment: Kyphotic  Communication   Communication: No difficulties  Cognition Arousal/Alertness: Awake/alert Behavior During Therapy: Anxious, Restless Overall Cognitive Status: No family/caregiver present to determine baseline cognitive functioning                                 General Comments: unclear if pt is baseline, somewhat unable to formulate home information        General Comments General comments (skin integrity, edema, etc.): pt was seen for progression of mobility and worked to assess her vitals with movement, no dizziness and no drops in O2 sats as she was 95% at rest from walk and HR was 102    Exercises     Assessment/Plan    PT Assessment Patient needs continued PT services  PT Problem List Decreased strength;Decreased range of motion;Decreased activity  tolerance;Decreased balance;Decreased mobility;Decreased coordination;Decreased knowledge of use of DME;Decreased safety awareness;Decreased skin integrity;Pain       PT Treatment Interventions DME instruction;Gait training;Functional mobility training;Therapeutic activities;Therapeutic exercise;Balance training;Neuromuscular re-education;Patient/family education    PT Goals (Current goals can be found in the Care Plan section)  Acute Rehab PT Goals Patient Stated Goal: to go home with stepson PT Goal Formulation: With patient Time For Goal Achievement: 01/08/22 Potential to Achieve Goals: Good    Frequency Min 3X/week     Co-evaluation               AM-PAC PT "6 Clicks" Mobility  Outcome Measure Help needed turning from your back to your side while in a flat bed without using bedrails?: A Lot Help needed moving from lying on your back to sitting on the side of a flat bed without using bedrails?: A Lot Help needed moving to and from a bed to a chair (including a wheelchair)?: A Lot Help needed standing up from a chair using your arms (e.g., wheelchair or bedside chair)?: A Little Help needed to walk in hospital room?: A Little Help needed climbing 3-5 steps with a railing? : Total 6 Click Score: 13    End of Session Equipment Utilized During Treatment: Gait belt Activity Tolerance: Patient tolerated treatment well;Other (comment) (  walked on room air) Patient left: in bed;with call bell/phone within reach;with nursing/sitter in room Nurse Communication: Mobility status PT Visit Diagnosis: Unsteadiness on feet (R26.81);Pain;Difficulty in walking, not elsewhere classified (R26.2) Pain - Right/Left: Left Pain - part of body: Arm    Time: 6553-7482 PT Time Calculation (min) (ACUTE ONLY): 25 min   Charges:   PT Evaluation $PT Eval Moderate Complexity: 1 Mod PT Treatments $Gait Training: 8-22 mins       Ivar Drape 12/25/2021, 4:13 PM  Samul Dada, PT PhD Acute  Rehab Dept. Number: [facility] [id number] and [facility] [phone]

## 2021-12-25 NOTE — ED Notes (Signed)
Help get patient moved over to a hospital bed help get patient stragthen up in the bed patient is resting with call bell in reach

## 2021-12-25 NOTE — Progress Notes (Signed)
Pt admitted to rm 10 from ED. CHG wipe given. Initiated tele. Oriented pt to the unit. VSS. Call bell within reach.   Lavenia Atlas, RN

## 2021-12-25 NOTE — Progress Notes (Signed)
PROGRESS NOTE    Jasmine Benson  GUY:[id number] DOB: November 05, 1945 DOA: 12/24/2021 PCP: Soundra Pilon, FNP   Brief Narrative: 76 year old with past medical history significant for hyperlipidemia, depression, alcohol intake daily presented to the hospital on 12/24/2021 complaining of acute chest wall pain, left wrist pain after a fall.  Patient was found by a good Samaritan down at bottom of some stair.      Assessment & Plan:   Principal Problem:   Chest wall pain Active Problems:   Left wrist fracture, closed, initial encounter   Fall at home, initial encounter   Acute alcohol intoxication (HCC)   Lactic acidosis   Acute encephalopathy   Depression   Chronic alcohol abuse   Hyperlipidemia  1-Fall, acute left wrist fracture: -X-ray: Comminuted impacted displaced fracture of the tip of the ulna styloid process.  -Displaced fracture of the distal radial metaphysis extending to the articular surface. -Pelvis x-ray: Subacute fracture of the bilateral sacral alla and left superior and inferior pubic Ramey. -CT head: No evidence of acute intracranial abnormality.  Left anterior scalp hematoma and laceration. -CT cervical spine: No evidence of acute fracture -CT maxillofacial no evidence of acute maxillofacial fracture. -CT chest abdomen and pelvis: No acute intrathoracic or intra-abdominal or intrapelvic abnormality. Evaluated by trauma team, no further recommendation pain management. For wrist fracture Dr. Yehuda Budd consulted.  Pain management.   Acute alcohol intoxication: Monitor on CIWA.  IV fluids.  Thiamine, folic acid  Lactic acidosis: Resolved.  Continue with IV fluids.  Check UA  Acute Metabolic encephalopathy: ? Alacohol intake.  She is alert and conversant.   Hyperlipidemia  Hypomagnesemia; replete IV>               Estimated body mass index is 24.46 kg/m as calculated from the following:   Height as of this encounter: 5\' 5"  (1.651 m).   Weight  as of this encounter: 66.7 kg.   DVT prophylaxis: SCD Code Status: Full code Family Communication: care discussed with patient.  Disposition Plan:  Status is: Observation The patient remains OBS appropriate and will d/c before 2 midnights.    Consultants:  Ortho Surgery   Procedures:  none  Antimicrobials:    Subjective: She is alert, report falling from stair, lost balance.   Objective: Vitals:   12/25/21 0600 12/25/21 0615 12/25/21 0645 12/25/21 0707  BP:  98/84 122/77 122/77  Pulse: 99 100 96 96  Resp: 13 12 16    Temp:      TempSrc:      SpO2: 93% 95% 96%   Weight:      Height:        Intake/Output Summary (Last 24 hours) at 12/25/2021 0754 Last data filed at 12/24/2021 1812 Gross per 24 hour  Intake 2000 ml  Output --  Net 2000 ml   Filed Weights   12/24/21 1045  Weight: 66.7 kg    Examination:  General exam: Appears calm and comfortable  Respiratory system: Clear to auscultation. Respiratory effort normal. Cardiovascular system: S1 & S2 heard, RRR. No JVD, murmurs, rubs, gallops or clicks. No pedal edema. Gastrointestinal system: Abdomen is nondistended, soft and nontender. No organomegaly or masses felt. Normal bowel sounds heard. Central nervous system: Alert and oriented.  Extremities: left arm    Data Reviewed: I have personally reviewed following labs and imaging studies  CBC: Recent Labs  Lab 12/24/21 1025 12/24/21 1036 12/25/21 0446  WBC 4.7  --  5.0  NEUTROABS  --   --  3.2  HGB 12.3 13.3 10.6*  HCT 37.3 39.0 31.3*  MCV 109.7*  --  109.8*  PLT 247  --  205   Basic Metabolic Panel: Recent Labs  Lab 12/24/21 1025 12/24/21 1036 12/25/21 0446  NA 138 136 137  K 3.8 3.7 4.2  CL 106 104 103  CO2 19*  --  24  GLUCOSE 111* 107* 106*  BUN 13 13 7*  CREATININE 0.73 1.00 0.72  CALCIUM 9.4  --  8.7*  MG  --   --  1.6*  PHOS  --   --  3.4   GFR: Estimated Creatinine Clearance: 54.7 mL/min (by C-G formula based on SCr of 0.72  mg/dL). Liver Function Tests: Recent Labs  Lab 12/24/21 1025 12/25/21 0446  AST 45* 31  ALT 29 21  ALKPHOS 46 43  BILITOT 0.6 1.0  PROT 6.7 5.6*  ALBUMIN 3.6 3.0*   No results for input(s): "LIPASE", "AMYLASE" in the last 168 hours. No results for input(s): "AMMONIA" in the last 168 hours. Coagulation Profile: Recent Labs  Lab 12/24/21 1120  INR 1.1   Cardiac Enzymes: No results for input(s): "CKTOTAL", "CKMB", "CKMBINDEX", "TROPONINI" in the last 168 hours. BNP (last 3 results) No results for input(s): "PROBNP" in the last 8760 hours. HbA1C: No results for input(s): "HGBA1C" in the last 72 hours. CBG: No results for input(s): "GLUCAP" in the last 168 hours. Lipid Profile: No results for input(s): "CHOL", "HDL", "LDLCALC", "TRIG", "CHOLHDL", "LDLDIRECT" in the last 72 hours. Thyroid Function Tests: No results for input(s): "TSH", "T4TOTAL", "FREET4", "T3FREE", "THYROIDAB" in the last 72 hours. Anemia Panel: No results for input(s): "VITAMINB12", "FOLATE", "FERRITIN", "TIBC", "IRON", "RETICCTPCT" in the last 72 hours. Sepsis Labs: Recent Labs  Lab 12/24/21 1010 12/24/21 1540 12/25/21 0446  LATICACIDVEN 3.6* 2.4* 1.2    Recent Results (from the past 240 hour(s))  Resp Panel by RT-PCR (Flu A&B, Covid) Anterior Nasal Swab     Status: None   Collection Time: 12/24/21  5:02 PM   Specimen: Anterior Nasal Swab  Result Value Ref Range Status   SARS Coronavirus 2 by RT PCR NEGATIVE NEGATIVE Final    Comment: (NOTE) SARS-CoV-2 target nucleic acids are NOT DETECTED.  The SARS-CoV-2 RNA is generally detectable in upper respiratory specimens during the acute phase of infection. The lowest concentration of SARS-CoV-2 viral copies this assay can detect is 138 copies/mL. A negative result does not preclude SARS-Cov-2 infection and should not be used as the sole basis for treatment or other patient management decisions. A negative result may occur with  improper specimen  collection/handling, submission of specimen other than nasopharyngeal swab, presence of viral mutation(s) within the areas targeted by this assay, and inadequate number of viral copies(<138 copies/mL). A negative result must be combined with clinical observations, patient history, and epidemiological information. The expected result is Negative.  Fact Sheet for Patients:  BloggerCourse.com  Fact Sheet for Healthcare Providers:  SeriousBroker.it  This test is no t yet approved or cleared by the [place] FDA and  has been authorized for detection and/or diagnosis of SARS-CoV-2 by FDA under an Emergency Use Authorization (EUA). This EUA will remain  in effect (meaning this test can be used) for the duration of the COVID-19 declaration under Section 564(b)(1) of the Act, 21 U.S.C.section 360bbb-3(b)(1), unless the authorization is terminated  or revoked sooner.       Influenza A by PCR NEGATIVE NEGATIVE Final   Influenza B by PCR NEGATIVE NEGATIVE Final  Comment: (NOTE) The Xpert Xpress SARS-CoV-2/FLU/RSV plus assay is intended as an aid in the diagnosis of influenza from Nasopharyngeal swab specimens and should not be used as a sole basis for treatment. Nasal washings and aspirates are unacceptable for Xpert Xpress SARS-CoV-2/FLU/RSV testing.  Fact Sheet for Patients: BloggerCourse.comhttps://www.fda.gov/media/152166/download  Fact Sheet for Healthcare Providers: SeriousBroker.ithttps://www.fda.gov/media/152162/download  This test is not yet approved or cleared by the [place] FDA and has been authorized for detection and/or diagnosis of SARS-CoV-2 by FDA under an Emergency Use Authorization (EUA). This EUA will remain in effect (meaning this test can be used) for the duration of the COVID-19 declaration under Section 564(b)(1) of the Act, 21 U.S.C. section 360bbb-3(b)(1), unless the authorization is terminated or revoked.  Performed at [facility], [address] [place], [place] [address]          Radiology Studies: DG Wrist Complete Left  Result Date: 12/24/2021 CLINICAL DATA:  Left wrist pain. EXAM: LEFT WRIST - COMPLETE 3+ VIEW COMPARISON:  None Available. FINDINGS: Generalized osteopenia. Comminuted impacted displaced fracture of the tip of the ulnar styloid process. Displaced fracture of the distal radial metaphysis extending to the articular surface. No aggressive osseous lesion. Normal alignment. Severe osteoarthritis of the first [facility] joint. Moderate osteoarthritis of the first MCP joint and first IP joint. 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 Soft tissue are unremarkable. No radiopaque foreign body or soft tissue emphysema. IMPRESSION: 1. Comminuted impacted displaced fracture of the tip of the ulnar styloid process. 2. Displaced fracture of the distal radial metaphysis extending to the articular surface. Electronically Signed   By: Elige KoHetal  Patel M.D.   On: 12/24/2021 12:09   DG Wrist Complete Left  Result Date: 12/24/2021 CLINICAL DATA:  Left wrist pain. EXAM: LEFT WRIST - COMPLETE 3+ VIEW COMPARISON:  None Available. FINDINGS: Generalized osteopenia. Comminuted impacted displaced fracture of the tip of the ulnar styloid process. Displaced fracture of the distal radial metaphysis extending to the articular surface. No aggressive osseous lesion. Normal alignment. Severe osteoarthritis of the first [facility] joint. Moderate osteoarthritis of the first MCP joint and first IP joint. 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 Soft tissue are unremarkable. No radiopaque foreign body or soft tissue emphysema. IMPRESSION: 1. Comminuted impacted displaced fracture of the tip of the ulnar styloid process. 2. Displaced fracture of the distal radial metaphysis extending to the articular surface. Electronically Signed   By: Elige KoHetal  Patel M.D.   On: 12/24/2021 12:09   CT HEAD WO CONTRAST  Result Date:  12/24/2021 CLINICAL DATA:  Provided history: Facial trauma, blunt. Head trauma, moderate/severe. Neck trauma, abnormal mental status or neuro exam. Fall. EXAM: CT HEAD WITHOUT CONTRAST CT MAXILLOFACIAL WITHOUT CONTRAST CT CERVICAL SPINE WITHOUT CONTRAST TECHNIQUE: Multidetector CT imaging of the head, cervical spine, and maxillofacial structures were performed using the standard protocol without intravenous contrast. Multiplanar CT image reconstructions of the cervical spine and maxillofacial structures were also generated. RADIATION DOSE REDUCTION: This exam was performed according to the departmental dose-optimization program which includes automated exposure control, adjustment of the mA and/or kV according to patient size and/or use of iterative reconstruction technique. COMPARISON:  CT head/cervical spine 10/15/2016. FINDINGS: CT HEAD FINDINGS Brain: Mild generalized cerebral atrophy. There is no acute intracranial hemorrhage. No demarcated cortical infarct. No extra-axial fluid collection. No evidence of an intracranial mass. No midline shift. Vascular: No hyperdense vessel. Atherosclerotic calcifications. Skull: No fracture or aggressive osseous lesion. Other: Left anterior scalp  hematoma and laceration. CT MAXILLOFACIAL FINDINGS Osseous: No evidence of acute maxillofacial fracture. Anterior position of the mandibular condyles, bilaterally. Orbits: No acute orbital finding. Sinuses: Minimal mucosal thickening within the anterior ethmoid air cells, bilaterally. Soft tissues: No significant maxillofacial soft tissue swelling appreciable by CT. CT CERVICAL SPINE FINDINGS Mildly motion degraded exam. Alignment: Straightening of the expected cervical lordosis. Slight C2-C3 grade 1 anterolisthesis. 4 mm C7-T1 grade 1 anterolisthesis. Skull base and vertebrae: The basion-dental and atlanto-dental intervals are maintained.No evidence of acute fracture to the cervical spine. Soft tissues and spinal canal: No  prevertebral fluid or swelling. No visible canal hematoma. Disc levels: Cervical spondylosis with multilevel disc space narrowing, disc bulges, posterior disc osteophyte complexes, endplate spurring, uncovertebral hypertrophy and facet arthrosis. Disc space narrowing is greatest at C3-C4, C4-C5, C5-C6, C6-C7 and C7-T1 (advanced at these levels). No appreciable high-grade spinal canal stenosis. Multilevel bony neural foraminal narrowing. Degenerative changes about the C1-C2 articulation. Upper chest: No consolidation within the imaged lung apices. No visible pneumothorax. IMPRESSION: CT head: 1. No evidence of acute intracranial abnormality. 2. Left anterior scalp hematoma and laceration. 3. Mild generalized cerebral atrophy. CT maxillofacial: 1. No evidence of acute maxillofacial fracture. 2. Anterior position of the bilateral mandibular condyles. Correlate with physical exam findings to exclude TMJ dislocation. CT cervical spine: 1. Mildly motion degraded exam. 2. No evidence of acute fracture to the cervical spine. 3. Grade 1 anterolisthesis at C2-C3 and C7-T1. 4. Cervical spondylosis, as described. Electronically Signed   By: Kellie Simmering D.O.   On: 12/24/2021 11:30   CT MAXILLOFACIAL WO CONTRAST  Result Date: 12/24/2021 CLINICAL DATA:  Provided history: Facial trauma, blunt. Head trauma, moderate/severe. Neck trauma, abnormal mental status or neuro exam. Fall. EXAM: CT HEAD WITHOUT CONTRAST CT MAXILLOFACIAL WITHOUT CONTRAST CT CERVICAL SPINE WITHOUT CONTRAST TECHNIQUE: Multidetector CT imaging of the head, cervical spine, and maxillofacial structures were performed using the standard protocol without intravenous contrast. Multiplanar CT image reconstructions of the cervical spine and maxillofacial structures were also generated. RADIATION DOSE REDUCTION: This exam was performed according to the departmental dose-optimization program which includes automated exposure control, adjustment of the mA and/or kV  according to patient size and/or use of iterative reconstruction technique. COMPARISON:  CT head/cervical spine 10/15/2016. FINDINGS: CT HEAD FINDINGS Brain: Mild generalized cerebral atrophy. There is no acute intracranial hemorrhage. No demarcated cortical infarct. No extra-axial fluid collection. No evidence of an intracranial mass. No midline shift. Vascular: No hyperdense vessel. Atherosclerotic calcifications. Skull: No fracture or aggressive osseous lesion. Other: Left anterior scalp hematoma and laceration. CT MAXILLOFACIAL FINDINGS Osseous: No evidence of acute maxillofacial fracture. Anterior position of the mandibular condyles, bilaterally. Orbits: No acute orbital finding. Sinuses: Minimal mucosal thickening within the anterior ethmoid air cells, bilaterally. Soft tissues: No significant maxillofacial soft tissue swelling appreciable by CT. CT CERVICAL SPINE FINDINGS Mildly motion degraded exam. Alignment: Straightening of the expected cervical lordosis. Slight C2-C3 grade 1 anterolisthesis. 4 mm C7-T1 grade 1 anterolisthesis. Skull base and vertebrae: The basion-dental and atlanto-dental intervals are maintained.No evidence of acute fracture to the cervical spine. Soft tissues and spinal canal: No prevertebral fluid or swelling. No visible canal hematoma. Disc levels: Cervical spondylosis with multilevel disc space narrowing, disc bulges, posterior disc osteophyte complexes, endplate spurring, uncovertebral hypertrophy and facet arthrosis. Disc space narrowing is greatest at C3-C4, C4-C5, C5-C6, C6-C7 and C7-T1 (advanced at these levels). No appreciable high-grade spinal canal stenosis. Multilevel bony neural foraminal narrowing. Degenerative changes about the C1-C2 articulation. Upper chest: No  consolidation within the imaged lung apices. No visible pneumothorax. IMPRESSION: CT head: 1. No evidence of acute intracranial abnormality. 2. Left anterior scalp hematoma and laceration. 3. Mild generalized  cerebral atrophy. CT maxillofacial: 1. No evidence of acute maxillofacial fracture. 2. Anterior position of the bilateral mandibular condyles. Correlate with physical exam findings to exclude TMJ dislocation. CT cervical spine: 1. Mildly motion degraded exam. 2. No evidence of acute fracture to the cervical spine. 3. Grade 1 anterolisthesis at C2-C3 and C7-T1. 4. Cervical spondylosis, as described. Electronically Signed   By: Jackey Loge D.O.   On: 12/24/2021 11:30   CT Cervical Spine Wo Contrast  Result Date: 12/24/2021 CLINICAL DATA:  Provided history: Facial trauma, blunt. Head trauma, moderate/severe. Neck trauma, abnormal mental status or neuro exam. Fall. EXAM: CT HEAD WITHOUT CONTRAST CT MAXILLOFACIAL WITHOUT CONTRAST CT CERVICAL SPINE WITHOUT CONTRAST TECHNIQUE: Multidetector CT imaging of the head, cervical spine, and maxillofacial structures were performed using the standard protocol without intravenous contrast. Multiplanar CT image reconstructions of the cervical spine and maxillofacial structures were also generated. RADIATION DOSE REDUCTION: This exam was performed according to the departmental dose-optimization program which includes automated exposure control, adjustment of the mA and/or kV according to patient size and/or use of iterative reconstruction technique. COMPARISON:  CT head/cervical spine 10/15/2016. FINDINGS: CT HEAD FINDINGS Brain: Mild generalized cerebral atrophy. There is no acute intracranial hemorrhage. No demarcated cortical infarct. No extra-axial fluid collection. No evidence of an intracranial mass. No midline shift. Vascular: No hyperdense vessel. Atherosclerotic calcifications. Skull: No fracture or aggressive osseous lesion. Other: Left anterior scalp hematoma and laceration. CT MAXILLOFACIAL FINDINGS Osseous: No evidence of acute maxillofacial fracture. Anterior position of the mandibular condyles, bilaterally. Orbits: No acute orbital finding. Sinuses: Minimal mucosal  thickening within the anterior ethmoid air cells, bilaterally. Soft tissues: No significant maxillofacial soft tissue swelling appreciable by CT. CT CERVICAL SPINE FINDINGS Mildly motion degraded exam. Alignment: Straightening of the expected cervical lordosis. Slight C2-C3 grade 1 anterolisthesis. 4 mm C7-T1 grade 1 anterolisthesis. Skull base and vertebrae: The basion-dental and atlanto-dental intervals are maintained.No evidence of acute fracture to the cervical spine. Soft tissues and spinal canal: No prevertebral fluid or swelling. No visible canal hematoma. Disc levels: Cervical spondylosis with multilevel disc space narrowing, disc bulges, posterior disc osteophyte complexes, endplate spurring, uncovertebral hypertrophy and facet arthrosis. Disc space narrowing is greatest at C3-C4, C4-C5, C5-C6, C6-C7 and C7-T1 (advanced at these levels). No appreciable high-grade spinal canal stenosis. Multilevel bony neural foraminal narrowing. Degenerative changes about the C1-C2 articulation. Upper chest: No consolidation within the imaged lung apices. No visible pneumothorax. IMPRESSION: CT head: 1. No evidence of acute intracranial abnormality. 2. Left anterior scalp hematoma and laceration. 3. Mild generalized cerebral atrophy. CT maxillofacial: 1. No evidence of acute maxillofacial fracture. 2. Anterior position of the bilateral mandibular condyles. Correlate with physical exam findings to exclude TMJ dislocation. CT cervical spine: 1. Mildly motion degraded exam. 2. No evidence of acute fracture to the cervical spine. 3. Grade 1 anterolisthesis at C2-C3 and C7-T1. 4. Cervical spondylosis, as described. Electronically Signed   By: Jackey Loge D.O.   On: 12/24/2021 11:30   CT CHEST ABDOMEN PELVIS W CONTRAST  Result Date: 12/24/2021 CLINICAL DATA:  Poly trauma, fall, head injury EXAM: CT CHEST, ABDOMEN, AND PELVIS WITH CONTRAST TECHNIQUE: Multidetector CT imaging of the chest, abdomen and pelvis was performed  following the standard protocol during bolus administration of intravenous contrast. RADIATION DOSE REDUCTION: This exam was performed according to the  departmental dose-optimization program which includes automated exposure control, adjustment of the mA and/or kV according to patient size and/or use of iterative reconstruction technique. CONTRAST:  60mL OMNIPAQUE IOHEXOL 350 MG/ML SOLN IV. No oral contrast. COMPARISON:  Hip CT 05/16/2021 FINDINGS: CT CHEST FINDINGS Cardiovascular: Atherosclerotic calcifications aorta. Aorta normal caliber. Heart unremarkable. No pericardial effusion. Mediastinum/Nodes: Esophagus unremarkable. Probable small hiatal hernia. Capsular calcification at BILATERAL breast prostheses. No thoracic adenopathy. Base of cervical region normal appearance. Lungs/Pleura: Minimal bibasilar atelectasis versus scarring. Minimal biapical scarring. Lungs otherwise clear. No acute infiltrate, pleural effusion, or pneumothorax. Musculoskeletal: Diffuse osseous demineralization. Multiple old healed LEFT rib fractures. RIGHT glenohumeral degenerative changes. Chronic appearing compression deformity of T7 vertebral body. CT ABDOMEN PELVIS FINDINGS Hepatobiliary: Gallbladder and liver normal appearance Pancreas: Normal appearance Spleen: Normal appearance Adrenals/Urinary Tract: Adrenal glands, kidneys, ureters, and bladder normal appearance. Stomach/Bowel: Appendix not visualized. Diverticulosis of descending and sigmoid colon without evidence of diverticulitis. Bowel loops otherwise unremarkable. Vascular/Lymphatic: Atherosclerotic calcifications aorta and iliac arteries without aneurysm. No adenopathy. Reproductive: Uterus surgically absent.  Unremarkable adnexa. Other: No free air or free fluid. Tiny umbilical hernia containing fat. No inflammatory process. Musculoskeletal: RIGHT hip prosthesis. Osseous demineralization. Subacute healing fractures of the LEFT pubic bones. Multilevel degenerative disc  disease changes lumbar spine with retrolisthesis L3-L4. IMPRESSION: No acute intrathoracic, intra-abdominal, or intrapelvic abnormalities. Distal colonic diverticulosis without evidence of diverticulitis. Tiny umbilical hernia containing fat. Subacute healing fractures of the LEFT pubic bones. Chronic appearing compression deformity of T7 vertebral body. Aortic Atherosclerosis (ICD10-I70.0). Electronically Signed   By: Ulyses Southward M.D.   On: 12/24/2021 11:23   DG Pelvis Portable  Result Date: 12/24/2021 CLINICAL DATA:  Unwitnessed fall EXAM: PORTABLE PELVIS 1-2 VIEWS COMPARISON:  CT 05/16/2021, 05/15/2021 FINDINGS: Prior right hip hemiarthroplasty. Subacute fractures of the bilateral sacral ala and left superior and inferior pubic rami. No definite acute fracture. No pelvic diastasis. IMPRESSION: Subacute fractures of the bilateral sacral ala and left superior and inferior pubic rami. No definite acute fracture. Electronically Signed   By: Duanne Guess D.O.   On: 12/24/2021 10:49   DG Chest Port 1 View  Result Date: 12/24/2021 CLINICAL DATA:  Unwitnessed fall EXAM: PORTABLE CHEST 1 VIEW COMPARISON:  05/17/2021 FINDINGS: Stable cardiomediastinal contours. Aortic atherosclerosis. No focal airspace consolidation, pleural effusion, or pneumothorax. Remote appearing bilateral rib deformities. Bilateral breast prostheses. IMPRESSION: No active disease. Electronically Signed   By: Duanne Guess D.O.   On: 12/24/2021 10:47        Scheduled Meds:  citalopram  20 mg Oral Daily   ezetimibe  10 mg Oral QHS   folic acid  1 mg Oral Daily   LORazepam  0-4 mg Intravenous Q6H   Or   LORazepam  0-4 mg Oral Q6H   [START ON 12/27/2021] LORazepam  0-4 mg Intravenous Q12H   Or   [START ON 12/27/2021] LORazepam  0-4 mg Oral Q12H   multivitamin  1 tablet Oral Daily   thiamine  100 mg Oral Daily   Or   thiamine  100 mg Intravenous Daily   Continuous Infusions:   LOS: 0 days    Time spent: 35 minutes.      Alba Cory, MD [facility]   If 7PM-7AM, please contact night-coverage [email or web address]  12/25/2021, 7:54 AM

## 2021-12-25 NOTE — Care Management Obs Status (Signed)
[facility] NOTIFICATION   Patient Details  Name: Jasmine Benson MRN: [id number] Date of Birth: 06/15/1945   Medicare Observation Status Notification Given:  Yes    Verdell Carmine, RN 12/25/2021, 11:49 AM

## 2021-12-25 NOTE — Care Management (Signed)
SA resources placed on AVS,.

## 2021-12-25 NOTE — Discharge Instructions (Addendum)
                  Intensive Outpatient Programs  [facility]    [facility] [address]     [address] [place],  [place]     [place], [place] [phone]      [phone]  [facility]   [facility]  (Inpatient and outpatient)  [phone] (Suboxone and Methadone) [address]           [phone]           [facility]    [facility] - Intensive Outpatient [address]     [address] [place], [place] [address]     [place], [place]  [phone]      [phone]  [facility] (Outpatient, Inpatient, Chemical  [facility] (Groups and Residental) (insurance only) [phone]    [place], [place]          [phone]       [facility]    [facility] (for caregivers and family) [address]     [address] [place], [place]     [place], [place] [phone]      [phone]  Residential Treatment Programs  [facility]  [facility](2 years) Residential: 90 days)  [facility] [address]      [address] [place], [place]     [place], [place] [phone]      [phone] or [phone]  [facility]    [facility] [address]      [address] [place], [place]     [place], [place] [phone]      [phone]  [facility]   [facility] ([facility]) [address]     [address] [place], [place] [address]     [place], [place] [phone]      [phone] Admissions: 8am-3pm M-F  [facility]: Residential Program (90 Days)              [facility]: [facility]  [place], [place]     [place], [place]  [phone] or [phone]    (Walk in Hours over the weekend or by referral)   [facility]: [facility]:[phone] (for crisis  response 24 hours a day) 

## 2021-12-25 NOTE — ED Notes (Signed)
Care assumed for pt.  Pt has no needs at this time.  Pt's cell phone and white purse on bed per request.. Monitors connected.  Call bell within reach. Purewick connected.

## 2021-12-25 NOTE — Progress Notes (Addendum)
[facility] Progress Note     Subjective: CC:  L wrist pain. Denies chest pain. States she has walked to the bathroom 3x. States she lives alone but has someone who can help her if she needs.   Objective: Vital signs in last 24 hours: Temp:  [94.5 F (34.7 C)-99 F (37.2 C)] 98.9 F (37.2 C) (11/03 0800) Pulse Rate:  [40-115] 105 (11/03 0800) Resp:  [12-27] 15 (11/03 0800) BP: (98-158)/(58-100) 132/69 (11/03 0800) SpO2:  [80 %-100 %] 100 % (11/03 0800) Weight:  [66.7 kg] 66.7 kg (11/02 1045)    Intake/Output from previous day: 11/02 0701 - 11/03 0700 In: 2000 [IV Piggyback:2000] Out: -  Intake/Output this shift: No intake/output data recorded.  PE: Gen:  Alert, NAD, pleasant HEENT: left forehead lac s/p repair, hemostatic, mild ecchmosis. PERRLA, EOMs in tact, oropharynx without blood Card:  Regular rate and rhythm Pulm:  Normal effort on supplemental O2, CTAB Abd: Soft, non-tender, non-distended MSK; L wrist splinted Skin: warm and dry, no rashes  Psych: A&Ox3   Lab Results:  Recent Labs    12/24/21 1025 12/24/21 1036 12/25/21 0446  WBC 4.7  --  5.0  HGB 12.3 13.3 10.6*  HCT 37.3 39.0 31.3*  PLT 247  --  205   BMET Recent Labs    12/24/21 1025 12/24/21 1036 12/25/21 0446  NA 138 136 137  K 3.8 3.7 4.2  CL 106 104 103  CO2 19*  --  24  GLUCOSE 111* 107* 106*  BUN 13 13 7*  CREATININE 0.73 1.00 0.72  CALCIUM 9.4  --  8.7*   PT/INR Recent Labs    12/24/21 1120  LABPROT 13.7  INR 1.1   CMP     Component Value Date/Time   NA 137 12/25/2021 0446   K 4.2 12/25/2021 0446   CL 103 12/25/2021 0446   CO2 24 12/25/2021 0446   GLUCOSE 106 (H) 12/25/2021 0446   BUN 7 (L) 12/25/2021 0446   CREATININE 0.72 12/25/2021 0446   CALCIUM 8.7 (L) 12/25/2021 0446   PROT 5.6 (L) 12/25/2021 0446   ALBUMIN 3.0 (L) 12/25/2021 0446   AST 31 12/25/2021 0446   ALT 21 12/25/2021 0446   ALKPHOS 43 12/25/2021 0446   BILITOT 1.0 12/25/2021 0446    GFRNONAA >60 12/25/2021 0446   GFRAA >60 11/15/2016 1339   Lipase  No results found for: "LIPASE"     Studies/Results: DG Wrist Complete Left  Result Date: 12/24/2021 CLINICAL DATA:  Left wrist pain. EXAM: LEFT WRIST - COMPLETE 3+ VIEW COMPARISON:  None Available. FINDINGS: Generalized osteopenia. Comminuted impacted displaced fracture of the tip of the ulnar styloid process. Displaced fracture of the distal radial metaphysis extending to the articular surface. No aggressive osseous lesion. Normal alignment. Severe osteoarthritis of the first [facility] joint. Moderate osteoarthritis of the first MCP joint and first IP joint. 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 Soft tissue are unremarkable. No radiopaque foreign body or soft tissue emphysema. IMPRESSION: 1. Comminuted impacted displaced fracture of the tip of the ulnar styloid process. 2. Displaced fracture of the distal radial metaphysis extending to the articular surface. Electronically Signed   By: Kathreen Devoid M.D.   On: 12/24/2021 12:09   DG Wrist Complete Left  Result Date: 12/24/2021 CLINICAL DATA:  Left wrist pain. EXAM: LEFT WRIST - COMPLETE 3+ VIEW COMPARISON:  None Available. FINDINGS: Generalized osteopenia. Comminuted impacted displaced fracture of the tip of the ulnar styloid process.  Displaced fracture of the distal radial metaphysis extending to the articular surface. No aggressive osseous lesion. Normal alignment. Severe osteoarthritis of the first [facility] joint. Moderate osteoarthritis of the first MCP joint and first IP joint. 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 Soft tissue are unremarkable. No radiopaque foreign body or soft tissue emphysema. IMPRESSION: 1. Comminuted impacted displaced fracture of the tip of the ulnar styloid process. 2. Displaced fracture of the distal radial metaphysis extending to the articular surface. Electronically Signed   By: Kathreen Devoid M.D.   On: 12/24/2021 12:09   CT  HEAD WO CONTRAST  Result Date: 12/24/2021 CLINICAL DATA:  Provided history: Facial trauma, blunt. Head trauma, moderate/severe. Neck trauma, abnormal mental status or neuro exam. Fall. EXAM: CT HEAD WITHOUT CONTRAST CT MAXILLOFACIAL WITHOUT CONTRAST CT CERVICAL SPINE WITHOUT CONTRAST TECHNIQUE: Multidetector CT imaging of the head, cervical spine, and maxillofacial structures were performed using the standard protocol without intravenous contrast. Multiplanar CT image reconstructions of the cervical spine and maxillofacial structures were also generated. RADIATION DOSE REDUCTION: This exam was performed according to the departmental dose-optimization program which includes automated exposure control, adjustment of the mA and/or kV according to patient size and/or use of iterative reconstruction technique. COMPARISON:  CT head/cervical spine 10/15/2016. FINDINGS: CT HEAD FINDINGS Brain: Mild generalized cerebral atrophy. There is no acute intracranial hemorrhage. No demarcated cortical infarct. No extra-axial fluid collection. No evidence of an intracranial mass. No midline shift. Vascular: No hyperdense vessel. Atherosclerotic calcifications. Skull: No fracture or aggressive osseous lesion. Other: Left anterior scalp hematoma and laceration. CT MAXILLOFACIAL FINDINGS Osseous: No evidence of acute maxillofacial fracture. Anterior position of the mandibular condyles, bilaterally. Orbits: No acute orbital finding. Sinuses: Minimal mucosal thickening within the anterior ethmoid air cells, bilaterally. Soft tissues: No significant maxillofacial soft tissue swelling appreciable by CT. CT CERVICAL SPINE FINDINGS Mildly motion degraded exam. Alignment: Straightening of the expected cervical lordosis. Slight C2-C3 grade 1 anterolisthesis. 4 mm C7-T1 grade 1 anterolisthesis. Skull base and vertebrae: The basion-dental and atlanto-dental intervals are maintained.No evidence of acute fracture to the cervical spine. Soft  tissues and spinal canal: No prevertebral fluid or swelling. No visible canal hematoma. Disc levels: Cervical spondylosis with multilevel disc space narrowing, disc bulges, posterior disc osteophyte complexes, endplate spurring, uncovertebral hypertrophy and facet arthrosis. Disc space narrowing is greatest at C3-C4, C4-C5, C5-C6, C6-C7 and C7-T1 (advanced at these levels). No appreciable high-grade spinal canal stenosis. Multilevel bony neural foraminal narrowing. Degenerative changes about the C1-C2 articulation. Upper chest: No consolidation within the imaged lung apices. No visible pneumothorax. IMPRESSION: CT head: 1. No evidence of acute intracranial abnormality. 2. Left anterior scalp hematoma and laceration. 3. Mild generalized cerebral atrophy. CT maxillofacial: 1. No evidence of acute maxillofacial fracture. 2. Anterior position of the bilateral mandibular condyles. Correlate with physical exam findings to exclude TMJ dislocation. CT cervical spine: 1. Mildly motion degraded exam. 2. No evidence of acute fracture to the cervical spine. 3. Grade 1 anterolisthesis at C2-C3 and C7-T1. 4. Cervical spondylosis, as described. Electronically Signed   By: Kellie Simmering D.O.   On: 12/24/2021 11:30   CT MAXILLOFACIAL WO CONTRAST  Result Date: 12/24/2021 CLINICAL DATA:  Provided history: Facial trauma, blunt. Head trauma, moderate/severe. Neck trauma, abnormal mental status or neuro exam. Fall. EXAM: CT HEAD WITHOUT CONTRAST CT MAXILLOFACIAL WITHOUT CONTRAST CT CERVICAL SPINE WITHOUT CONTRAST TECHNIQUE: Multidetector CT imaging of the head, cervical spine, and maxillofacial structures were performed using the standard protocol without intravenous contrast.  Multiplanar CT image reconstructions of the cervical spine and maxillofacial structures were also generated. RADIATION DOSE REDUCTION: This exam was performed according to the departmental dose-optimization program which includes automated exposure control,  adjustment of the mA and/or kV according to patient size and/or use of iterative reconstruction technique. COMPARISON:  CT head/cervical spine 10/15/2016. FINDINGS: CT HEAD FINDINGS Brain: Mild generalized cerebral atrophy. There is no acute intracranial hemorrhage. No demarcated cortical infarct. No extra-axial fluid collection. No evidence of an intracranial mass. No midline shift. Vascular: No hyperdense vessel. Atherosclerotic calcifications. Skull: No fracture or aggressive osseous lesion. Other: Left anterior scalp hematoma and laceration. CT MAXILLOFACIAL FINDINGS Osseous: No evidence of acute maxillofacial fracture. Anterior position of the mandibular condyles, bilaterally. Orbits: No acute orbital finding. Sinuses: Minimal mucosal thickening within the anterior ethmoid air cells, bilaterally. Soft tissues: No significant maxillofacial soft tissue swelling appreciable by CT. CT CERVICAL SPINE FINDINGS Mildly motion degraded exam. Alignment: Straightening of the expected cervical lordosis. Slight C2-C3 grade 1 anterolisthesis. 4 mm C7-T1 grade 1 anterolisthesis. Skull base and vertebrae: The basion-dental and atlanto-dental intervals are maintained.No evidence of acute fracture to the cervical spine. Soft tissues and spinal canal: No prevertebral fluid or swelling. No visible canal hematoma. Disc levels: Cervical spondylosis with multilevel disc space narrowing, disc bulges, posterior disc osteophyte complexes, endplate spurring, uncovertebral hypertrophy and facet arthrosis. Disc space narrowing is greatest at C3-C4, C4-C5, C5-C6, C6-C7 and C7-T1 (advanced at these levels). No appreciable high-grade spinal canal stenosis. Multilevel bony neural foraminal narrowing. Degenerative changes about the C1-C2 articulation. Upper chest: No consolidation within the imaged lung apices. No visible pneumothorax. IMPRESSION: CT head: 1. No evidence of acute intracranial abnormality. 2. Left anterior scalp hematoma and  laceration. 3. Mild generalized cerebral atrophy. CT maxillofacial: 1. No evidence of acute maxillofacial fracture. 2. Anterior position of the bilateral mandibular condyles. Correlate with physical exam findings to exclude TMJ dislocation. CT cervical spine: 1. Mildly motion degraded exam. 2. No evidence of acute fracture to the cervical spine. 3. Grade 1 anterolisthesis at C2-C3 and C7-T1. 4. Cervical spondylosis, as described. Electronically Signed   By: Kellie Simmering D.O.   On: 12/24/2021 11:30   CT Cervical Spine Wo Contrast  Result Date: 12/24/2021 CLINICAL DATA:  Provided history: Facial trauma, blunt. Head trauma, moderate/severe. Neck trauma, abnormal mental status or neuro exam. Fall. EXAM: CT HEAD WITHOUT CONTRAST CT MAXILLOFACIAL WITHOUT CONTRAST CT CERVICAL SPINE WITHOUT CONTRAST TECHNIQUE: Multidetector CT imaging of the head, cervical spine, and maxillofacial structures were performed using the standard protocol without intravenous contrast. Multiplanar CT image reconstructions of the cervical spine and maxillofacial structures were also generated. RADIATION DOSE REDUCTION: This exam was performed according to the departmental dose-optimization program which includes automated exposure control, adjustment of the mA and/or kV according to patient size and/or use of iterative reconstruction technique. COMPARISON:  CT head/cervical spine 10/15/2016. FINDINGS: CT HEAD FINDINGS Brain: Mild generalized cerebral atrophy. There is no acute intracranial hemorrhage. No demarcated cortical infarct. No extra-axial fluid collection. No evidence of an intracranial mass. No midline shift. Vascular: No hyperdense vessel. Atherosclerotic calcifications. Skull: No fracture or aggressive osseous lesion. Other: Left anterior scalp hematoma and laceration. CT MAXILLOFACIAL FINDINGS Osseous: No evidence of acute maxillofacial fracture. Anterior position of the mandibular condyles, bilaterally. Orbits: No acute orbital  finding. Sinuses: Minimal mucosal thickening within the anterior ethmoid air cells, bilaterally. Soft tissues: No significant maxillofacial soft tissue swelling appreciable by CT. CT CERVICAL SPINE FINDINGS Mildly motion degraded exam. Alignment: Straightening of the  expected cervical lordosis. Slight C2-C3 grade 1 anterolisthesis. 4 mm C7-T1 grade 1 anterolisthesis. Skull base and vertebrae: The basion-dental and atlanto-dental intervals are maintained.No evidence of acute fracture to the cervical spine. Soft tissues and spinal canal: No prevertebral fluid or swelling. No visible canal hematoma. Disc levels: Cervical spondylosis with multilevel disc space narrowing, disc bulges, posterior disc osteophyte complexes, endplate spurring, uncovertebral hypertrophy and facet arthrosis. Disc space narrowing is greatest at C3-C4, C4-C5, C5-C6, C6-C7 and C7-T1 (advanced at these levels). No appreciable high-grade spinal canal stenosis. Multilevel bony neural foraminal narrowing. Degenerative changes about the C1-C2 articulation. Upper chest: No consolidation within the imaged lung apices. No visible pneumothorax. IMPRESSION: CT head: 1. No evidence of acute intracranial abnormality. 2. Left anterior scalp hematoma and laceration. 3. Mild generalized cerebral atrophy. CT maxillofacial: 1. No evidence of acute maxillofacial fracture. 2. Anterior position of the bilateral mandibular condyles. Correlate with physical exam findings to exclude TMJ dislocation. CT cervical spine: 1. Mildly motion degraded exam. 2. No evidence of acute fracture to the cervical spine. 3. Grade 1 anterolisthesis at C2-C3 and C7-T1. 4. Cervical spondylosis, as described. Electronically Signed   By: Kellie Simmering D.O.   On: 12/24/2021 11:30   CT CHEST ABDOMEN PELVIS W CONTRAST  Result Date: 12/24/2021 CLINICAL DATA:  Poly trauma, fall, head injury EXAM: CT CHEST, ABDOMEN, AND PELVIS WITH CONTRAST TECHNIQUE: Multidetector CT imaging of the chest,  abdomen and pelvis was performed following the standard protocol during bolus administration of intravenous contrast. RADIATION DOSE REDUCTION: This exam was performed according to the departmental dose-optimization program which includes automated exposure control, adjustment of the mA and/or kV according to patient size and/or use of iterative reconstruction technique. CONTRAST:  29mL OMNIPAQUE IOHEXOL 350 MG/ML SOLN IV. No oral contrast. COMPARISON:  Hip CT 05/16/2021 FINDINGS: CT CHEST FINDINGS Cardiovascular: Atherosclerotic calcifications aorta. Aorta normal caliber. Heart unremarkable. No pericardial effusion. Mediastinum/Nodes: Esophagus unremarkable. Probable small hiatal hernia. Capsular calcification at BILATERAL breast prostheses. No thoracic adenopathy. Base of cervical region normal appearance. Lungs/Pleura: Minimal bibasilar atelectasis versus scarring. Minimal biapical scarring. Lungs otherwise clear. No acute infiltrate, pleural effusion, or pneumothorax. Musculoskeletal: Diffuse osseous demineralization. Multiple old healed LEFT rib fractures. RIGHT glenohumeral degenerative changes. Chronic appearing compression deformity of T7 vertebral body. CT ABDOMEN PELVIS FINDINGS Hepatobiliary: Gallbladder and liver normal appearance Pancreas: Normal appearance Spleen: Normal appearance Adrenals/Urinary Tract: Adrenal glands, kidneys, ureters, and bladder normal appearance. Stomach/Bowel: Appendix not visualized. Diverticulosis of descending and sigmoid colon without evidence of diverticulitis. Bowel loops otherwise unremarkable. Vascular/Lymphatic: Atherosclerotic calcifications aorta and iliac arteries without aneurysm. No adenopathy. Reproductive: Uterus surgically absent.  Unremarkable adnexa. Other: No free air or free fluid. Tiny umbilical hernia containing fat. No inflammatory process. Musculoskeletal: RIGHT hip prosthesis. Osseous demineralization. Subacute healing fractures of the LEFT pubic bones.  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 IMPRESSION: No acute intrathoracic, intra-abdominal, or intrapelvic abnormalities. Distal colonic diverticulosis without evidence of diverticulitis. Tiny umbilical hernia containing fat. Subacute healing fractures of the LEFT pubic bones. Chronic appearing compression deformity of T7 vertebral body. Aortic Atherosclerosis (ICD10-I70.0). Electronically Signed   By: Lavonia Dana M.D.   On: 12/24/2021 11:23   DG Pelvis Portable  Result Date: 12/24/2021 CLINICAL DATA:  Unwitnessed fall EXAM: PORTABLE PELVIS 1-2 VIEWS COMPARISON:  CT 05/16/2021, 05/15/2021 FINDINGS: Prior right hip hemiarthroplasty. Subacute fractures of the bilateral sacral ala and left superior and inferior pubic rami. No definite acute fracture. No pelvic diastasis. IMPRESSION: Subacute fractures of the bilateral sacral  ala and left superior and inferior pubic rami. No definite acute fracture. Electronically Signed   By: Davina Poke D.O.   On: 12/24/2021 10:49   DG Chest Port 1 View  Result Date: 12/24/2021 CLINICAL DATA:  Unwitnessed fall EXAM: PORTABLE CHEST 1 VIEW COMPARISON:  05/17/2021 FINDINGS: Stable cardiomediastinal contours. Aortic atherosclerosis. No focal airspace consolidation, pleural effusion, or pneumothorax. Remote appearing bilateral rib deformities. Bilateral breast prostheses. IMPRESSION: No active disease. Electronically Signed   By: Davina Poke D.O.   On: 12/24/2021 10:47    Anti-infectives: Anti-infectives (From admission, onward)    None        Assessment/Plan  75 y/o F s/p GLF  Head lac s/p repair by EDP - will need suture removal by PCP in 1 weeks L wrist FX - per ortho hand Alcohol intoxication - CIWA, CSW consult for SBIRT   No other acute trauma injuries identified on tertiary survey. PT/OT evals ordered. Ok for D/C from trauma perspective if cleared by therapies and seen by ortho. No need for outpatient  trauma follow up.  Trauma will sign off.   LOS: 0 days   I reviewed nursing notes, ED provider notes, hospitalist notes, last 24 h vitals and pain scores, last 48 h intake and output, last 24 h labs and trends, and last 24 h imaging results.    Obie Dredge, PA-C [facility] Please see Amion for pager number during day hours 7:00am-4:30pm

## 2021-12-26 ENCOUNTER — Inpatient Hospital Stay (HOSPITAL_COMMUNITY): Payer: Medicare HMO

## 2021-12-26 DIAGNOSIS — R0789 Other chest pain: Secondary | ICD-10-CM | POA: Diagnosis not present

## 2021-12-26 LAB — CBC
HCT: 31.1 % — ABNORMAL LOW (ref 36.0–46.0)
Hemoglobin: 10.5 g/dL — ABNORMAL LOW (ref 12.0–15.0)
MCH: 37.4 pg — ABNORMAL HIGH (ref 26.0–34.0)
MCHC: 33.8 g/dL (ref 30.0–36.0)
MCV: 110.7 fL — ABNORMAL HIGH (ref 80.0–100.0)
Platelets: 198 10*3/uL (ref 150–400)
RBC: 2.81 MIL/uL — ABNORMAL LOW (ref 3.87–5.11)
RDW: 12.9 % (ref 11.5–15.5)
WBC: 6.1 10*3/uL (ref 4.0–10.5)
nRBC: 0 % (ref 0.0–0.2)

## 2021-12-26 LAB — BASIC METABOLIC PANEL
Anion gap: 7 (ref 5–15)
BUN: 5 mg/dL — ABNORMAL LOW (ref 8–23)
CO2: 23 mmol/L (ref 22–32)
Calcium: 8.1 mg/dL — ABNORMAL LOW (ref 8.9–10.3)
Chloride: 102 mmol/L (ref 98–111)
Creatinine, Ser: 0.6 mg/dL (ref 0.44–1.00)
GFR, Estimated: >60 mL/min (ref >60)
Glucose, Bld: 100 mg/dL — ABNORMAL HIGH (ref 70–99)
Potassium: 3.4 mmol/L — ABNORMAL LOW (ref 3.5–5.1)
Sodium: 132 mmol/L — ABNORMAL LOW (ref 135–145)

## 2021-12-26 LAB — MAGNESIUM: Magnesium: 1.7 mg/dL (ref 1.7–2.4)

## 2021-12-26 MED ORDER — MAGNESIUM SULFATE 2 GM/50ML IV SOLN
2.0000 g | Freq: Once | INTRAVENOUS | Status: AC
  Administered 2021-12-26: 2 g via INTRAVENOUS
  Filled 2021-12-26: qty 50

## 2021-12-26 MED ORDER — LORAZEPAM 1 MG PO TABS: 1.0000 mg | ORAL_TABLET | ORAL | Status: AC | PRN

## 2021-12-26 MED ORDER — LORAZEPAM 2 MG/ML IJ SOLN
0.0000 mg | INTRAMUSCULAR | Status: AC
  Filled 2021-12-26: qty 1

## 2021-12-26 MED ORDER — LORAZEPAM 2 MG/ML IJ SOLN: 0.0000 mg | Freq: Three times a day (TID) | INTRAMUSCULAR | Status: DC

## 2021-12-26 MED ORDER — LORAZEPAM 2 MG/ML IJ SOLN: 1.0000 mg | INTRAMUSCULAR | Status: AC | PRN

## 2021-12-26 MED ORDER — POTASSIUM CHLORIDE 20 MEQ PO PACK
40.0000 meq | PACK | Freq: Once | ORAL | Status: AC
  Administered 2021-12-26: 40 meq via ORAL
  Filled 2021-12-26: qty 2

## 2021-12-26 NOTE — TOC Initial Note (Signed)
Transition of Care [facility]) - Initial/Assessment Note    Patient Details  Name: Jasmine Benson MRN: [id number] Date of Birth: [birth year]  Transition of Care [facility]) CM/SW Contact:    [address], [place] Phone Number: 12/26/2021, 1:27 PM  Clinical Narrative:                  CSW met with pt at bedside, pt asleep but woke easily. CSW spoke with pt about PT and MD recommendations for SNF at dc, pt immediately refused SNF. Pt states she completed 30 days at [facility] a few months ago and stated it was a bad experience and she would be fine at home. CSW attempted to speak with pt about PT at a different SNF, pt stated she would not go and would be fine at home. CSW inquired about family supports, pt states her step son stops by daily and that is all the support she needs. CSW again tried to explain that rehab would be short term with a goal of returning home soon after, pt again refusing. Pt had a pained look on her face and kept her eyes closed, CSW ended the conversation as pt appeared agitated. TOC will continue to follow.         Patient Goals and CMS Choice        Expected Discharge Plan and Services                                                Prior Living Arrangements/Services                       Activities of Daily Living      Permission Sought/Granted                  Emotional Assessment              Admission diagnosis:  Lactic acidosis [E87.20] Chest wall pain [R07.89] Tachycardia [R00.0] Rib pain [R07.81] Elevated MCV [R71.8] Laceration of scalp without foreign body, initial encounter [F02.77AJ] Alcoholic intoxication with complication ([place]) [O87.867] Closed fracture of left wrist, initial encounter [S62.102A] Altered mental status, unspecified altered mental status type [R41.82] Fall (on) (from) other stairs and steps, initial encounter [W10.8XXA] Patient Active Problem List   Diagnosis Date Noted   Left wrist  fracture, closed, initial encounter 12/25/2021   Fall at home, initial encounter 12/25/2021   Acute alcohol intoxication ([place]) 12/25/2021   Lactic acidosis 12/25/2021   Acute encephalopathy 12/25/2021   Depression 12/25/2021   Chronic alcohol abuse 12/25/2021   Hyperlipidemia 12/25/2021   Chest wall pain 12/24/2021   Fever 05/17/2021   Sacral fracture ([place]) 05/17/2021   Hypokalemia 05/16/2021   Normocytic anemia 05/16/2021   Pubic ramus fracture ([employer]) 05/15/2021   Hyponatremia 05/15/2021   Macrocytosis 05/15/2021   Rib fracture 05/15/2021   Intractable pain 05/15/2021   Elevated LFTs 05/15/2021   Akathisia    Hip fracture, right, closed, initial encounter ([place]) 10/03/2016   PCP:  Kristen Loader, FNP Pharmacy:   [id number] [place] [address] [id number]: [phone]: [phone]    Social Determinants of Health (SDOH) Interventions    Readmission Risk Interventions     No data to display

## 2021-12-26 NOTE — Evaluation (Signed)
Occupational Therapy Evaluation Patient Details Name: Jasmine Benson MRN: [id number] DOB: 1945/12/18 Today's Date: 12/26/2021   History of Present Illness 76 yo female admitted on 11/2 was found to have comminuted impacted displaced fracture of the tip of the ulnar styloid process, displaced fracture of the distal radial metaphysis extending to the articular surface.  Non surgical management in splint, awaiting need for surgery.  PMHx:  EtOH, falls   Clinical Impression   Pt questionable historian, reports independent at baseline with use of cane and is ind with ADLs. 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 Pt needing min cues to adhere to NWB precautions, needing min-max A for ADLs, mod A for bed mobility, and min A for transfers with HHA. Pt with impaired cognition and awareness of deficits, thinking she will "be fine" at home despite increased assist needed for basic ADL. Pt with pain with shoulder movement, and L hand edematous, elevated at end of session. Pt presenting with impairments listed below, will follow acutely. Recommend SNF at d/c.     Recommendations for follow up therapy are one component of a multi-disciplinary discharge planning process, led by the attending physician.  Recommendations may be updated based on patient status, additional functional criteria and insurance authorization.   Follow Up Recommendations  Skilled nursing-short term rehab (<3 hours/day)    Assistance Recommended at Discharge Frequent or constant Supervision/Assistance  Patient can return home with the following A little help with walking and/or transfers;A lot of help with bathing/dressing/bathroom;Assistance with cooking/housework;Direct supervision/assist for financial management;Direct supervision/assist for medications management;Assist for transportation;Help with stairs or ramp for entrance    Functional Status Assessment  Patient has had a recent decline in their functional status and  demonstrates the ability to make significant improvements in function in a reasonable and predictable amount of time.  Equipment Recommendations  BSC/3in1    Recommendations for Other Services PT consult     Precautions / Restrictions Precautions Precautions: Fall Precaution Comments: NWB LUE Required Braces or Orthoses: Splint/Cast Splint/Cast: L wrist Restrictions Weight Bearing Restrictions: Yes LUE Weight Bearing: Non weight bearing      Mobility Bed Mobility Overal bed mobility: Needs Assistance Bed Mobility: Supine to Sit, Sit to Supine     Supine to sit: Mod assist Sit to supine: Mod assist   General bed mobility comments: mod A for trunk elevation, and returning BLE's to bed    Transfers Overall transfer level: Needs assistance Equipment used: 1 person hand held assist Transfers: Sit to/from Stand Sit to Stand: Min assist           General transfer comment: min A for steadying      Balance Overall balance assessment: Needs assistance Sitting-balance support: Feet supported Sitting balance-Leahy Scale: Fair     Standing balance support: Single extremity supported, During functional activity Standing balance-Leahy Scale: Fair Standing balance comment: less than fair dynamically                           ADL either performed or assessed with clinical judgement   ADL Overall ADL's : Needs assistance/impaired Eating/Feeding: Minimal assistance   Grooming: Minimal assistance   Upper Body Bathing: Moderate assistance   Lower Body Bathing: Maximal assistance   Upper Body Dressing : Moderate assistance   Lower Body Dressing: Maximal assistance   Toilet Transfer: Minimal assistance;Ambulation;Regular Toilet   Toileting- Clothing Manipulation and Hygiene: Minimal assistance       Functional mobility during  ADLs: Minimal assistance       Vision   Vision Assessment?: No apparent visual deficits     Perception     Praxis       Pertinent Vitals/Pain Pain Assessment Pain Assessment: Faces Pain Score: 6  Faces Pain Scale: Hurts even more Pain Location: L arm and ribcage Pain Descriptors / Indicators: Aching, Grimacing, Guarding Pain Intervention(s): Limited activity within patient's tolerance, Monitored during session, Repositioned, Patient requesting pain meds-RN notified     Hand Dominance Right   Extremity/Trunk Assessment Upper Extremity Assessment Upper Extremity Assessment: LUE deficits/detail LUE Deficits / Details: <90* shoulder flex and abd, ~30 ext, sugar tong splint from wrist to elbow, pt with 1/5 digit composite flex/ext, edematous L hand LUE: Unable to fully assess due to immobilization;Shoulder pain with ROM LUE Sensation: decreased light touch LUE Coordination: decreased fine motor;decreased gross motor   Lower Extremity Assessment Lower Extremity Assessment: Defer to PT evaluation   Cervical / Trunk Assessment Cervical / Trunk Assessment: Kyphotic   Communication Communication Communication: No difficulties   Cognition Arousal/Alertness: Awake/alert Behavior During Therapy: Flat affect Overall Cognitive Status: Impaired/Different from baseline Area of Impairment: Orientation, Attention, Memory, Following commands, Safety/judgement, Awareness, Problem solving                 Orientation Level: Situation Current Attention Level: Focused Memory: Decreased short-term memory Following Commands: Follows one step commands with increased time Safety/Judgement: Decreased awareness of safety, Decreased awareness of deficits Awareness: Emergent Problem Solving: Slow processing, Decreased initiation, Requires verbal cues, Requires tactile cues General Comments: pt stating her "back is broken" that stating " i mean my shoulder" once asked, pt keeping eyes closed frequently during session, verbal and tactile cues needed to initiate movement     General Comments  VSS on RA     Exercises     Shoulder Instructions      Home Living Family/patient expects to be discharged to:: Private residence Living Arrangements: Alone Available Help at Discharge: Family;Available PRN/intermittently Type of Home: House Home Access: Level entry     Home Layout: Two level;1/2 bath on main level;Bed/bath upstairs Alternate Level Stairs-Number of Steps: 16 Alternate Level Stairs-Rails: Left Bathroom Shower/Tub: Occupational psychologist: Standard     Home Equipment: Conservation officer, nature (2 wheels);Crutches;Hand held shower head          Prior Functioning/Environment Prior Level of Function : Independent/Modified Independent             Mobility Comments: reports use of cane ADLs Comments: reports ind        OT Problem List: Decreased strength;Decreased range of motion;Decreased activity tolerance;Impaired balance (sitting and/or standing);Decreased safety awareness;Decreased cognition;Impaired UE functional use;Decreased knowledge of use of DME or AE;Decreased knowledge of precautions      OT Treatment/Interventions: Self-care/ADL training;Therapeutic exercise;DME and/or AE instruction;Energy conservation;Therapeutic activities;Patient/family education;Balance training;Cognitive remediation/compensation    OT Goals(Current goals can be found in the care plan section) Acute Rehab OT Goals Patient Stated Goal: none stated OT Goal Formulation: With patient Time For Goal Achievement: 01/09/22 Potential to Achieve Goals: Good ADL Goals Pt Will Perform Grooming: with modified independence;sitting Pt Will Perform Upper Body Bathing: with modified independence;sitting;standing Pt Will Perform Upper Body Dressing: with modified independence;sitting;standing Pt Will Transfer to Toilet: with modified independence;ambulating;regular height toilet Pt Will Perform Toileting - Clothing Manipulation and hygiene: with modified independence;sitting/lateral leans;sit  to/from stand Additional ADL Goal #1: pt will follow 3 step command in order to improve independence with ADLs  OT Frequency: Min 2X/week    Co-evaluation              AM-PAC OT "6 Clicks" Daily Activity     Outcome Measure Help from another person eating meals?: A Little Help from another person taking care of personal grooming?: A Little Help from another person toileting, which includes using toliet, bedpan, or urinal?: A Lot Help from another person bathing (including washing, rinsing, drying)?: A Lot Help from another person to put on and taking off regular upper body clothing?: A Lot Help from another person to put on and taking off regular lower body clothing?: A Lot 6 Click Score: 14   End of Session Equipment Utilized During Treatment: Gait belt Nurse Communication: Mobility status  Activity Tolerance: Patient tolerated treatment well Patient left: in bed;with call bell/phone within reach;with bed alarm set  OT Visit Diagnosis: Unsteadiness on feet (R26.81);Other abnormalities of gait and mobility (R26.89);Muscle weakness (generalized) (M62.81)                Time: 5625-6389 OT Time Calculation (min): 36 min Charges:  OT General Charges $OT Visit: 1 Visit OT Evaluation $OT Eval Moderate Complexity: 1 Mod OT Treatments $Self Care/Home Management : 8-22 mins  Alfonzo Beers, OTD, OTR/L Acute Rehab [phone]) [phone]  Mayer Masker 12/26/2021, 4:06 PM

## 2021-12-26 NOTE — Progress Notes (Signed)
OT Cancellation Note  Patient Details Name: Jasmine Benson MRN: [id number] DOB: 11-Mar-1945   Cancelled Treatment:    Reason Eval/Treat Not Completed: Other (comment) (Pt getting cleaned up by RN, just returned to bed. Will follow up as schedule permits.)  Lynnda Child, OTD, OTR/L Acute Rehab [phone] - [place] 12/26/2021, 11:53 AM

## 2021-12-26 NOTE — Progress Notes (Signed)
PROGRESS NOTE    Jasmine Benson  NWG:[id number] DOB: [birth year] DOA: 12/24/2021 PCP: Soundra Pilon, FNP   Brief Narrative: 76 year old with past medical history significant for hyperlipidemia, depression, alcohol intake daily presented to the hospital on 12/24/2021 complaining of acute chest wall pain, left wrist pain after a fall.  Patient was found by a good Samaritan down at bottom of some stair.      Assessment & Plan:   Principal Problem:   Chest wall pain Active Problems:   Left wrist fracture, closed, initial encounter   Fall at home, initial encounter   Acute alcohol intoxication (HCC)   Lactic acidosis   Acute encephalopathy   Depression   Chronic alcohol abuse   Hyperlipidemia  1-Fall, Acute Left Wrist Fracture: -X-ray: Comminuted impacted displaced fracture of the tip of the ulna styloid process.  -Displaced fracture of the distal radial metaphysis extending to the articular surface. -Pelvis x-ray: Subacute fracture of the bilateral sacral alla and left superior and inferior pubic Ramey. -CT head: No evidence of acute intracranial abnormality.  Left anterior scalp hematoma and laceration. -CT cervical spine: No evidence of acute fracture -CT maxillofacial no evidence of acute maxillofacial fracture. -CT chest abdomen and pelvis: No acute intrathoracic or intra-abdominal or intrapelvic abnormality. Evaluated by trauma team, no further recommendation pain management. Dr. Yehuda Budd consulted. He recommend NWB to left extremity and follow up in the office Pain management. He discussed with ED immobilization.   Acute alcohol intoxication: Monitor on CIWA.  IV fluids.  Thiamine, folic acid CIWA score 15. Continue with ativan.   Lactic acidosis: Resolved.  Continue with IV fluids.  UA: Negative  Acute Metabolic encephalopathy: ? Alacohol intake.  She is alert and conversant.  Alcohol level 191  Hyperlipidemia  Hypomagnesemia; Replete another dose IV.     Shoulder pain; will get x ray   Estimated body mass index is 25.64 kg/m as calculated from the following:   Height as of this encounter: 5\' 5"  (1.651 m).   Weight as of this encounter: 69.9 kg.   DVT prophylaxis: SCD Code Status: Full code Family Communication: Care discussed with patient. Sister dodnt answer  Disposition Plan:  Status is: Observation The patient remains OBS appropriate and will d/c before 2 midnights.    Consultants:  Ortho Surgery   Procedures:  none  Antimicrobials:    Subjective: She is alert, she feels confuse. Report shoulder pain.  Patient relates she has a daughter, but she doesn't get alone with her.  She has a sister, that I can call and update.    Objective: Vitals:   12/26/21 0305 12/26/21 0500 12/26/21 0700 12/26/21 1140  BP: 138/81  (!) 140/92 112/66  Pulse: (!) 107  (!) 111 90  Resp: 18  18   Temp: 98.1 F (36.7 C)  98.5 F (36.9 C) 98 F (36.7 C)  TempSrc: Oral  Oral Oral  SpO2: 100%  99% 95%  Weight:  69.9 kg    Height:        Intake/Output Summary (Last 24 hours) at 12/26/2021 1319 Last data filed at 12/26/2021 0600 Gross per 24 hour  Intake --  Output 550 ml  Net -550 ml    Filed Weights   12/24/21 1045 12/26/21 0500  Weight: 66.7 kg 69.9 kg    Examination:  General exam: NAD Respiratory system: CTA Cardiovascular system: S 1, S 2 RRR Gastrointestinal system: BS present, soft, nt Central nervous system: alert, confuse Extremities: left arm splint  Data Reviewed: I have personally reviewed following labs and imaging studies  CBC: Recent Labs  Lab 12/24/21 1025 12/24/21 1036 12/25/21 0446 12/26/21 0755  WBC 4.7  --  5.0 6.1  NEUTROABS  --   --  3.2  --   HGB 12.3 13.3 10.6* 10.5*  HCT 37.3 39.0 31.3* 31.1*  MCV 109.7*  --  109.8* 110.7*  PLT 247  --  205 542    Basic Metabolic Panel: Recent Labs  Lab 12/24/21 1025 12/24/21 1036 12/25/21 0446 12/26/21 0755  NA 138 136 137 132*  K 3.8  3.7 4.2 3.4*  CL 106 104 103 102  CO2 19*  --  24 23  GLUCOSE 111* 107* 106* 100*  BUN 13 13 7* 5*  CREATININE 0.73 1.00 0.72 0.60  CALCIUM 9.4  --  8.7* 8.1*  MG  --   --  1.6* 1.7  PHOS  --   --  3.4  --     GFR: Estimated Creatinine Clearance: 59.7 mL/min (by C-G formula based on SCr of 0.6 mg/dL). Liver Function Tests: Recent Labs  Lab 12/24/21 1025 12/25/21 0446  AST 45* 31  ALT 29 21  ALKPHOS 46 43  BILITOT 0.6 1.0  PROT 6.7 5.6*  ALBUMIN 3.6 3.0*    No results for input(s): "LIPASE", "AMYLASE" in the last 168 hours. No results for input(s): "AMMONIA" in the last 168 hours. Coagulation Profile: Recent Labs  Lab 12/24/21 1120  INR 1.1    Cardiac Enzymes: No results for input(s): "CKTOTAL", "CKMB", "CKMBINDEX", "TROPONINI" in the last 168 hours. BNP (last 3 results) No results for input(s): "PROBNP" in the last 8760 hours. HbA1C: No results for input(s): "HGBA1C" in the last 72 hours. CBG: No results for input(s): "GLUCAP" in the last 168 hours. Lipid Profile: No results for input(s): "CHOL", "HDL", "LDLCALC", "TRIG", "CHOLHDL", "LDLDIRECT" in the last 72 hours. Thyroid Function Tests: No results for input(s): "TSH", "T4TOTAL", "FREET4", "T3FREE", "THYROIDAB" in the last 72 hours. Anemia Panel: No results for input(s): "VITAMINB12", "FOLATE", "FERRITIN", "TIBC", "IRON", "RETICCTPCT" in the last 72 hours. Sepsis Labs: Recent Labs  Lab 12/24/21 1010 12/24/21 1540 12/25/21 0446  LATICACIDVEN 3.6* 2.4* 1.2     Recent Results (from the past 240 hour(s))  Resp Panel by RT-PCR (Flu A&B, Covid) Anterior Nasal Swab     Status: None   Collection Time: 12/24/21  5:02 PM   Specimen: Anterior Nasal Swab  Result Value Ref Range Status   SARS Coronavirus 2 by RT PCR NEGATIVE NEGATIVE Final    Comment: (NOTE) SARS-CoV-2 target nucleic acids are NOT DETECTED.  The SARS-CoV-2 RNA is generally detectable in upper respiratory specimens during the acute phase of  infection. The lowest concentration of SARS-CoV-2 viral copies this assay can detect is 138 copies/mL. A negative result does not preclude SARS-Cov-2 infection and should not be used as the sole basis for treatment or other patient management decisions. A negative result may occur with  improper specimen collection/handling, submission of specimen other than nasopharyngeal swab, presence of viral mutation(s) within the areas targeted by this assay, and inadequate number of viral copies(<138 copies/mL). A negative result must be combined with clinical observations, patient history, and epidemiological information. The expected result is Negative.  Fact Sheet for Patients:  EntrepreneurPulse.com.au  Fact Sheet for Healthcare Providers:  IncredibleEmployment.be  This test is no t yet approved or cleared by the [place] FDA and  has been authorized for detection and/or diagnosis of SARS-CoV-2 by FDA  under an Emergency Use Authorization (EUA). This EUA will remain  in effect (meaning this test can be used) for the duration of the COVID-19 declaration under Section 564(b)(1) of the Act, 21 U.S.C.section 360bbb-3(b)(1), unless the authorization is terminated  or revoked sooner.       Influenza A by PCR NEGATIVE NEGATIVE Final   Influenza B by PCR NEGATIVE NEGATIVE Final    Comment: (NOTE) The Xpert Xpress SARS-CoV-2/FLU/RSV plus assay is intended as an aid in the diagnosis of influenza from Nasopharyngeal swab specimens and should not be used as a sole basis for treatment. Nasal washings and aspirates are unacceptable for Xpert Xpress SARS-CoV-2/FLU/RSV testing.  Fact Sheet for Patients: BloggerCourse.com  Fact Sheet for Healthcare Providers: SeriousBroker.it  This test is not yet approved or cleared by the [place] FDA and has been authorized for detection and/or diagnosis of SARS-CoV-2  by FDA under an Emergency Use Authorization (EUA). This EUA will remain in effect (meaning this test can be used) for the duration of the COVID-19 declaration under Section 564(b)(1) of the Act, 21 U.S.C. section 360bbb-3(b)(1), unless the authorization is terminated or revoked.  Performed at [facility], [address], [place], [place] [address]          Radiology Studies: No results found.      Scheduled Meds:  citalopram  20 mg Oral Daily   ezetimibe  10 mg Oral QHS   folic acid  1 mg Oral Daily   LORazepam  0-4 mg Intravenous Q4H   Followed by   Melene Muller ON 12/28/2021] LORazepam  0-4 mg Intravenous Q8H   multivitamin  1 tablet Oral Daily   thiamine  100 mg Oral Daily   Or   thiamine  100 mg Intravenous Daily   Continuous Infusions:  sodium chloride 75 mL/hr at 12/26/21 0231     LOS: 1 day    Time spent: 35 minutes.     Alba Cory, MD [facility]   If 7PM-7AM, please contact night-coverage [email or web address]  12/26/2021, 1:19 PM

## 2021-12-27 DIAGNOSIS — R0789 Other chest pain: Secondary | ICD-10-CM | POA: Diagnosis not present

## 2021-12-27 LAB — BASIC METABOLIC PANEL
Anion gap: 8 (ref 5–15)
BUN: 7 mg/dL — ABNORMAL LOW (ref 8–23)
CO2: 22 mmol/L (ref 22–32)
Calcium: 8.2 mg/dL — ABNORMAL LOW (ref 8.9–10.3)
Chloride: 103 mmol/L (ref 98–111)
Creatinine, Ser: 0.53 mg/dL (ref 0.44–1.00)
GFR, Estimated: >60 mL/min (ref >60)
Glucose, Bld: 88 mg/dL (ref 70–99)
Potassium: 3.8 mmol/L (ref 3.5–5.1)
Sodium: 133 mmol/L — ABNORMAL LOW (ref 135–145)

## 2021-12-27 LAB — CBC
HCT: 29.5 % — ABNORMAL LOW (ref 36.0–46.0)
Hemoglobin: 10.3 g/dL — ABNORMAL LOW (ref 12.0–15.0)
MCH: 37.3 pg — ABNORMAL HIGH (ref 26.0–34.0)
MCHC: 34.9 g/dL (ref 30.0–36.0)
MCV: 106.9 fL — ABNORMAL HIGH (ref 80.0–100.0)
Platelets: 204 10*3/uL (ref 150–400)
RBC: 2.76 MIL/uL — ABNORMAL LOW (ref 3.87–5.11)
RDW: 12.7 % (ref 11.5–15.5)
WBC: 7.1 10*3/uL (ref 4.0–10.5)
nRBC: 0 % (ref 0.0–0.2)

## 2021-12-27 LAB — MAGNESIUM: Magnesium: 1.8 mg/dL (ref 1.7–2.4)

## 2021-12-27 LAB — VITAMIN B1: Vitamin B1 (Thiamine): 143 nmol/L (ref 66.5–200.0)

## 2021-12-27 MED ORDER — MAGNESIUM SULFATE 2 GM/50ML IV SOLN
2.0000 g | Freq: Once | INTRAVENOUS | Status: AC
  Administered 2021-12-27: 2 g via INTRAVENOUS
  Filled 2021-12-27: qty 50

## 2021-12-27 NOTE — Progress Notes (Signed)
PROGRESS NOTE    Jasmine Benson  ZOX:[id number] DOB: Oct 26, 1945 DOA: 12/24/2021 PCP: Jasmine Loader, FNP   Brief Narrative: 76 year old with past medical history significant for hyperlipidemia, depression, alcohol intake daily presented to the hospital on 12/24/2021 complaining of acute chest wall pain, left wrist pain after a fall.  Patient was found by a good Samaritan down at bottom of some stair.      Assessment & Plan:   Principal Problem:   Chest wall pain Active Problems:   Left wrist fracture, closed, initial encounter   Fall at home, initial encounter   Acute alcohol intoxication ([place])   Lactic acidosis   Acute encephalopathy   Depression   Chronic alcohol abuse   Hyperlipidemia  1-Fall, Acute Left Wrist Fracture: -X-ray: Comminuted impacted displaced fracture of the tip of the ulna styloid process.  -Displaced fracture of the distal radial metaphysis extending to the articular surface. -Pelvis x-ray: Subacute fracture of the bilateral sacral alla and left superior and inferior pubic [place]. -CT head: No evidence of acute intracranial abnormality.  Left anterior scalp hematoma and laceration. -CT cervical spine: No evidence of acute fracture -CT maxillofacial no evidence of acute maxillofacial fracture. -CT chest abdomen and pelvis: No acute intrathoracic or intra-abdominal or intrapelvic abnormality. Evaluated by trauma team, no further recommendation pain management. Dr. Greta Doom consulted. He recommend NWB to left extremity and follow up in the office Pain management. He discussed with ED immobilization.   Acute alcohol intoxication: Monitor on CIWA.  IV fluids.  Thiamine, folic acid CIWA score down to 0 . Continue with ativan PRN  Lactic acidosis: Resolved.  Continue with IV fluids.  UA: Negative  Acute Metabolic encephalopathy: ? Alacohol intake.  She is alert and conversant.  Alcohol level 191  Hyperlipidemia  Hypomagnesemia; Replete.    Shoulder pain; Mild acromioclavicular degenerative change. Tiny subacromial spur. Pain management.   Estimated body mass index is 26.01 kg/m as calculated from the following:   Height as of this encounter: 5\' 5"  (1.651 m).   Weight as of this encounter: 70.9 kg.   DVT prophylaxis: SCD Code Status: Full code Family Communication: Care discussed with patient. Sister dodnt answer  Disposition Plan:  Status is: Observation The patient remains OBS appropriate and will d/c before 2 midnights.    Consultants:  Ortho Surgery   Procedures:  none  Antimicrobials:    Subjective: She asked  me to call her step son, he would be abel to help her at home. She decline SNF.    Objective: Vitals:   12/27/21 0300 12/27/21 0800 12/27/21 1000 12/27/21 1200  BP: 113/71 113/76  108/66  Pulse: 93 88 94 85  Resp: 20 18    Temp: 98.1 F (36.7 C) (!) 97.5 F (36.4 C)    TempSrc: Oral Oral    SpO2: 97% 99%  98%  Weight: 70.9 kg     Height:        Intake/Output Summary (Last 24 hours) at 12/27/2021 1314 Last data filed at 12/27/2021 0342 Gross per 24 hour  Intake --  Output 950 ml  Net -950 ml    Filed Weights   12/24/21 1045 12/26/21 0500 12/27/21 0300  Weight: 66.7 kg 69.9 kg 70.9 kg    Examination:  General exam: NAD Respiratory system: CTA Cardiovascular system: S 1, S 2 RRR Gastrointestinal system: BS present, soft, nt Central nervous system: Alert, follows command.  Extremities: Left arm splint    Data Reviewed: I have personally reviewed following  labs and imaging studies  CBC: Recent Labs  Lab 12/24/21 1025 12/24/21 1036 12/25/21 0446 12/26/21 0755 12/27/21 0140  WBC 4.7  --  5.0 6.1 7.1  NEUTROABS  --   --  3.2  --   --   HGB 12.3 13.3 10.6* 10.5* 10.3*  HCT 37.3 39.0 31.3* 31.1* 29.5*  MCV 109.7*  --  109.8* 110.7* 106.9*  PLT 247  --  205 198 204    Basic Metabolic Panel: Recent Labs  Lab 12/24/21 1025 12/24/21 1036 12/25/21 0446  12/26/21 0755 12/27/21 0140  NA 138 136 137 132* 133*  K 3.8 3.7 4.2 3.4* 3.8  CL 106 104 103 102 103  CO2 19*  --  24 23 22   GLUCOSE 111* 107* 106* 100* 88  BUN 13 13 7* 5* 7*  CREATININE 0.73 1.00 0.72 0.60 0.53  CALCIUM 9.4  --  8.7* 8.1* 8.2*  MG  --   --  1.6* 1.7 1.8  PHOS  --   --  3.4  --   --     GFR: Estimated Creatinine Clearance: 60 mL/min (by C-G formula based on SCr of 0.53 mg/dL). Liver Function Tests: Recent Labs  Lab 12/24/21 1025 12/25/21 0446  AST 45* 31  ALT 29 21  ALKPHOS 46 43  BILITOT 0.6 1.0  PROT 6.7 5.6*  ALBUMIN 3.6 3.0*    No results for input(s): "LIPASE", "AMYLASE" in the last 168 hours. No results for input(s): "AMMONIA" in the last 168 hours. Coagulation Profile: Recent Labs  Lab 12/24/21 1120  INR 1.1    Cardiac Enzymes: No results for input(s): "CKTOTAL", "CKMB", "CKMBINDEX", "TROPONINI" in the last 168 hours. BNP (last 3 results) No results for input(s): "PROBNP" in the last 8760 hours. HbA1C: No results for input(s): "HGBA1C" in the last 72 hours. CBG: No results for input(s): "GLUCAP" in the last 168 hours. Lipid Profile: No results for input(s): "CHOL", "HDL", "LDLCALC", "TRIG", "CHOLHDL", "LDLDIRECT" in the last 72 hours. Thyroid Function Tests: No results for input(s): "TSH", "T4TOTAL", "FREET4", "T3FREE", "THYROIDAB" in the last 72 hours. Anemia Panel: No results for input(s): "VITAMINB12", "FOLATE", "FERRITIN", "TIBC", "IRON", "RETICCTPCT" in the last 72 hours. Sepsis Labs: Recent Labs  Lab 12/24/21 1010 12/24/21 1540 12/25/21 0446  LATICACIDVEN 3.6* 2.4* 1.2     Recent Results (from the past 240 hour(s))  Resp Panel by RT-PCR (Flu A&B, Covid) Anterior Nasal Swab     Status: None   Collection Time: 12/24/21  5:02 PM   Specimen: Anterior Nasal Swab  Result Value Ref Range Status   SARS Coronavirus 2 by RT PCR NEGATIVE NEGATIVE Final    Comment: (NOTE) SARS-CoV-2 target nucleic acids are NOT DETECTED.  The  SARS-CoV-2 RNA is generally detectable in upper respiratory specimens during the acute phase of infection. The lowest concentration of SARS-CoV-2 viral copies this assay can detect is 138 copies/mL. A negative result does not preclude SARS-Cov-2 infection and should not be used as the sole basis for treatment or other patient management decisions. A negative result may occur with  improper specimen collection/handling, submission of specimen other than nasopharyngeal swab, presence of viral mutation(s) within the areas targeted by this assay, and inadequate number of viral copies(<138 copies/mL). A negative result must be combined with clinical observations, patient history, and epidemiological information. The expected result is Negative.  Fact Sheet for Patients:  13/02/23  Fact Sheet for Healthcare Providers:  BloggerCourse.com  This test is no t yet approved or cleared by  the [employer] and  has been authorized for detection and/or diagnosis of SARS-CoV-2 by FDA under an Emergency Use Authorization (EUA). This EUA will remain  in effect (meaning this test can be used) for the duration of the COVID-19 declaration under Section 564(b)(1) of the Act, 21 U.S.C.section 360bbb-3(b)(1), unless the authorization is terminated  or revoked sooner.       Influenza A by PCR NEGATIVE NEGATIVE Final   Influenza B by PCR NEGATIVE NEGATIVE Final    Comment: (NOTE) The Xpert Xpress SARS-CoV-2/FLU/RSV plus assay is intended as an aid in the diagnosis of influenza from Nasopharyngeal swab specimens and should not be used as a sole basis for treatment. Nasal washings and aspirates are unacceptable for Xpert Xpress SARS-CoV-2/FLU/RSV testing.  Fact Sheet for Patients: BloggerCourse.com  Fact Sheet for Healthcare Providers: SeriousBroker.it  This test is not yet approved or  cleared by the [place] FDA and has been authorized for detection and/or diagnosis of SARS-CoV-2 by FDA under an Emergency Use Authorization (EUA). This EUA will remain in effect (meaning this test can be used) for the duration of the COVID-19 declaration under Section 564(b)(1) of the Act, 21 U.S.C. section 360bbb-3(b)(1), unless the authorization is terminated or revoked.  Performed at [facility], [address], [place], [place] [address]          Radiology Studies: DG Shoulder Left  Result Date: 12/26/2021 CLINICAL DATA:  Left shoulder pain. EXAM: LEFT SHOULDER - 2+ VIEW COMPARISON:  Report mass from chest CT 12/24/2021 FINDINGS: The bones are subjectively under mineralized. There is no evidence of fracture or dislocation. Mild acromioclavicular degenerative change. Tiny subacromial spur. Soft tissues are unremarkable. IMPRESSION: Mild acromioclavicular degenerative change. Tiny subacromial spur. Electronically Signed   By: Narda Rutherford M.D.   On: 12/26/2021 15:16        Scheduled Meds:  citalopram  20 mg Oral Daily   ezetimibe  10 mg Oral QHS   folic acid  1 mg Oral Daily   LORazepam  0-4 mg Intravenous Q4H   Followed by   Melene Muller ON 12/28/2021] LORazepam  0-4 mg Intravenous Q8H   multivitamin  1 tablet Oral Daily   thiamine  100 mg Oral Daily   Or   thiamine  100 mg Intravenous Daily   Continuous Infusions:  sodium chloride 75 mL/hr at 12/27/21 0808     LOS: 2 days    Time spent: 35 minutes.     Alba Cory, MD [facility]   If 7PM-7AM, please contact night-coverage [email or web address]  12/27/2021, 1:14 PM

## 2021-12-27 NOTE — TOC Initial Note (Signed)
Transition of Care (TOC) - Initial/Assessment Note  Donn Pierini RN, BSN Transitions of Care Unit 4E- RN Case Manager See Treatment Team for direct phone #   Patient Details  Name: Jasmine Benson MRN: [id number] Date of Birth: 03-29-1945  Transition of Care [facility]) CM/SW Contact:    Darrold Span, RN Phone Number: 12/27/2021, 2:55 PM  Clinical Narrative:                 Pt from home, recommendation for SNF- however pt is refusing at this time. Orders placed for HHRN/PT/OT/aide/CSW, also noted referral regarding private caregiver.   CM spoke with pt at bedside- discussed transition needs. Per notes pt is NWB at this time on LUE- plan to f/u with Ortho in 1 week. Discussed with pt need for someone to be with her at home for safety reasons. Pt reporting that she has a sister and son that can come check on her- explained that it might be best to have someone 24/7 at least for first week. Discussed private duty assistance. Explained that this was something insurance did not cover and TOC did not assist with arranging. Pt voiced understanding. She plans to speak with family and will look into private duty. Per pt she has had HH in past- used [facility]- and understands that [facility] does not come daily.  List provided for [facility] choice Per CMS guidelines from PhoneFinancing.pl website with star ratings (copy placed in shadow chart)- pt voiced she would want to use [facility] again.   Info provided on some Private Duty options- pt to f/u and think about Home w/ HH vs Rehab and TOC will f/u in AM for final plan.   Expected Discharge Plan: Home w Home Health Services Barriers to Discharge: Continued Medical Work up   Patient Goals and CMS Choice Patient states their goals for this hospitalization and ongoing recovery are:: return home CMS Medicare.gov Compare Post Acute Care list provided to:: Patient Choice offered to / list presented to : Patient  Expected Discharge Plan and Services Expected  Discharge Plan: Home w Home Health Services In-house Referral: Clinical Social Work Discharge Planning Services: CM Consult Post Acute Care Choice: Home Health, Durable Medical Equipment Living arrangements for the past 2 months: Single Family Home                   DME Agency: [facility]       HH Arranged: RN, PT, OT, Nurse's Aide, Social Work [employer] Agency: [facility]        Prior Living Arrangements/Services Living arrangements for the past 2 months: Single Family Home Lives with:: Self Patient language and need for interpreter reviewed:: Yes Do you feel safe going back to the place where you live?: Yes      Need for Family Participation in Patient Care: Yes (Comment) Care giver support system in place?: Yes (comment)   Criminal Activity/Legal Involvement Pertinent to Current Situation/Hospitalization: No - Comment as needed  Activities of Daily Living      Permission Sought/Granted Permission sought to share information with : Facility Industrial/product designer granted to share information with : Yes, Verbal Permission Granted     Permission granted to share info w AGENCY: [facility]        Emotional Assessment Appearance:: Appears stated age Attitude/Demeanor/Rapport: Engaged Affect (typically observed): Accepting Orientation: : Oriented to Self, Oriented to Place, Oriented to  Time, Oriented to Situation Alcohol / Substance Use: Alcohol Use Psych Involvement:  No (comment)  Admission diagnosis:  Lactic acidosis [E87.20] Chest wall pain [R07.89] Tachycardia [R00.0] Rib pain [R07.81] Elevated MCV [R71.8] Laceration of scalp without foreign body, initial encounter [H08.65HQ] Alcoholic intoxication with complication ([place]) [I69.629] Closed fracture of left wrist, initial encounter [S62.102A] Altered mental status, unspecified altered mental status type [R41.82] Fall (on) (from) other stairs and steps, initial encounter  [W10.8XXA] Patient Active Problem List   Diagnosis Date Noted   Left wrist fracture, closed, initial encounter 12/25/2021   Fall at home, initial encounter 12/25/2021   Acute alcohol intoxication ([place]) 12/25/2021   Lactic acidosis 12/25/2021   Acute encephalopathy 12/25/2021   Depression 12/25/2021   Chronic alcohol abuse 12/25/2021   Hyperlipidemia 12/25/2021   Chest wall pain 12/24/2021   Fever 05/17/2021   Sacral fracture ([place]) 05/17/2021   Hypokalemia 05/16/2021   Normocytic anemia 05/16/2021   Pubic ramus fracture ([place]) 05/15/2021   Hyponatremia 05/15/2021   Macrocytosis 05/15/2021   Rib fracture 05/15/2021   Intractable pain 05/15/2021   Elevated LFTs 05/15/2021   Akathisia    Hip fracture, right, closed, initial encounter ([place]) 10/03/2016   PCP:  Kristen Loader, FNP Pharmacy:   [id number] - [place], [place] [address] [place] [address] Phone: [phone] Fax: [phone]     Social Determinants of Health (SDOH) Interventions    Readmission Risk Interventions     No data to display

## 2021-12-28 DIAGNOSIS — R0789 Other chest pain: Secondary | ICD-10-CM | POA: Diagnosis not present

## 2021-12-28 MED ORDER — OXYCODONE-ACETAMINOPHEN 5-325 MG PO TABS
1.0000 | ORAL_TABLET | Freq: Four times a day (QID) | ORAL | Status: DC | PRN
  Administered 2021-12-28 – 2021-12-29 (×4): 2 via ORAL
  Filled 2021-12-28 (×4): qty 2

## 2021-12-28 MED ORDER — HYDROMORPHONE HCL 1 MG/ML IJ SOLN: 0.5000 mg | INTRAMUSCULAR | Status: DC | PRN

## 2021-12-28 MED ORDER — SODIUM CHLORIDE 0.9 % IV BOLUS
500.0000 mL | Freq: Once | INTRAVENOUS | Status: AC
  Administered 2021-12-28: 500 mL via INTRAVENOUS

## 2021-12-28 MED ORDER — METHOCARBAMOL 500 MG PO TABS
500.0000 mg | ORAL_TABLET | Freq: Three times a day (TID) | ORAL | Status: DC | PRN
  Administered 2021-12-28 – 2021-12-29 (×2): 500 mg via ORAL
  Filled 2021-12-28 (×2): qty 1

## 2021-12-28 NOTE — ED Provider Notes (Addendum)
Medical screening examination/treatment/procedure(s) were conducted as a shared visit with non-physician practitioner(s) and myself.  I personally evaluated the patient during the encounter.  See Jasmine Benson note for full attestation.   Jasmine Benson.Laceration Repair  Date/Time: 12/28/2021 7:35 AM  Performed by: Margette Fast, MD Authorized by: Margette Fast, MD   Consent:    Consent obtained:  Emergent situation Universal protocol:    Patient identity confirmed:  Verbally with patient Anesthesia:    Anesthesia method:  Local infiltration   Local anesthetic:  Lidocaine 2% WITH epi Laceration details:    Location:  Scalp   Scalp location:  Frontal   Length (cm):  4 Exploration:    Limited defect created (wound extended): no     Hemostasis achieved with:  Direct pressure   Imaging outcome: foreign body not noted     Wound exploration: entire depth of wound visualized     Wound extent: fascia not violated, no foreign body, no signs of injury, no nerve damage, no tendon damage and no vascular damage     Contaminated: no   Treatment:    Area cleansed with:  Povidone-iodine and saline   Amount of cleaning:  Standard   Irrigation method:  Pressure wash Skin repair:    Repair method:  Sutures   Suture size:  3-0   Suture material:  Prolene   Suture technique:  Simple interrupted   Number of sutures:  3 Approximation:    Approximation:  Close Repair type:    Repair type:  Simple Post-procedure details:    Dressing:  Open (no dressing)   Procedure completion:  Tolerated well, no immediate complications    Nanda Quinton, MD Emergency Medicine    Breella Vanostrand, [facility], MD 12/28/21 [id number]    Margette Fast, MD 12/28/21 [phone]

## 2021-12-28 NOTE — TOC Progression Note (Addendum)
Transition of Care (TOC) - Progression Note  Valentina Gu, BSN Transitions of Care Unit 4E- RN Case Manager See Treatment Team for direct phone #   Patient Details  Name: Jasmine Benson MRN: [id number] Date of Birth: [birth year]  Transition of Care [facility]) CM/SW Contact  Dahlia Client Romeo Rabon, RN Phone Number: 12/28/2021, 4:11 PM  Clinical Narrative:    Follow up done with pt regarding transition plans. Per pt she has a friend that has agreed to stay with her on discharge initially until she follows up with Ortho. Pt states she prefers to go home- and is declining SNF for rehab.  Step-son to transport home. No DME needs pt reports she has RW, BSC, etc at home.  Confirmed HH choice as [facility]- CM will make referral to [facility] for [facility] needs.   Call made to [facility] for [facility] referral- referral has been accepted and they will follow for start of care post discharge   Expected Discharge Plan: [place] Barriers to Discharge: Continued Medical Work up  Expected Discharge Plan and Services Expected Discharge Plan: [place] In-house Referral: Clinical Social Work Discharge Planning Services: CM Consult Post Acute Care Choice: Home Health, Durable Medical Equipment Living arrangements for the past 2 months: Single Family Home                   DME Agency: [place]: RN, PT, OT, Nurse's Aide, Social Work [employer] Agency: [facility]         Social Determinants of Health (SDOH) Interventions    Readmission Risk Interventions     No data to display

## 2021-12-28 NOTE — Progress Notes (Signed)
Occupational Therapy Treatment Patient Details Name: Jasmine Benson MRN: [id number] DOB: Jun 27, 1945 Today's Date: 12/28/2021   History of present illness 76 yo female admitted on 11/2 was found to have comminuted impacted displaced fracture of the tip of the ulnar styloid process, displaced fracture of the distal radial metaphysis extending to the articular surface.  Non surgical management in splint, awaiting need for surgery.  PMHx:  EtOH, falls   OT comments  Patient supine in bed and eager to get OOB today.  Pt with ace wrap over splint partially removed, RN aware and reports pt "picked at it".  Pt with no recall of NWB to LUE, educated on importance of NWB as well as elevation to UE.  Discussed use of sling for mobility for comfort, as UE is very heavy to her.  She completes bed mobility with min assist, transfers with min assist and ADLs with min-mod assist.  Discussed recommendations to 24/7 support or at least assist with all transfers, mobility, ADLs/IADLs throughout day- pt agreeable and reports she has plenty of help.  Continue to recommend SNF at this time, but anticipate pt to decline needing maximal home health services including aide, OT and PT.  Will follow acutely.    Recommendations for follow up therapy are one component of a multi-disciplinary discharge planning process, led by the attending physician.  Recommendations may be updated based on patient status, additional functional criteria and insurance authorization.    Follow Up Recommendations  Skilled nursing-short term rehab (<3 hours/day)    Assistance Recommended at Discharge Frequent or constant Supervision/Assistance  Patient can return home with the following  A little help with walking and/or transfers;A lot of help with bathing/dressing/bathroom;Assistance with cooking/housework;Direct supervision/assist for financial management;Direct supervision/assist for medications management;Assist for transportation;Help  with stairs or ramp for entrance   Equipment Recommendations  BSC/3in1    Recommendations for Other Services      Precautions / Restrictions Precautions Precautions: Fall Precaution Comments: NWB LUE Required Braces or Orthoses: Splint/Cast Splint/Cast: L wrist Restrictions Weight Bearing Restrictions: Yes LUE Weight Bearing: Non weight bearing       Mobility Bed Mobility Overal bed mobility: Needs Assistance Bed Mobility: Supine to Sit     Supine to sit: Min assist     General bed mobility comments: to elevate trunk, HOB elevated and increased time required    Transfers Overall transfer level: Needs assistance Equipment used: 1 person hand held assist Transfers: Sit to/from Stand Sit to Stand: Min assist           General transfer comment: to steady and fully ascend     Balance Overall balance assessment: Needs assistance Sitting-balance support: No upper extremity supported, Feet supported Sitting balance-Leahy Scale: Fair     Standing balance support: Single extremity supported, During functional activity Standing balance-Leahy Scale: Fair Standing balance comment: relies on UE support                           ADL either performed or assessed with clinical judgement   ADL Overall ADL's : Needs assistance/impaired Eating/Feeding: Set up;Sitting Eating/Feeding Details (indicate cue type and reason): encouragment to cut up her food, needs setup for opening creamer/packages Grooming: Minimal assistance;Sitting               Lower Body Dressing: Minimal assistance;Sit to/from stand Lower Body Dressing Details (indicate cue type and reason): able to manage socks, min assist in standing for balance dynamically Toilet Transfer:  Minimal assistance;Ambulation Toilet Transfer Details (indicate cue type and reason): simulated in room, hand held assist         Functional mobility during ADLs: Minimal assistance General ADL Comments: hand  held assist for mobility, mild unsteadiness    Extremity/Trunk Assessment              Vision       Perception     Praxis      Cognition Arousal/Alertness: Awake/alert Behavior During Therapy: Flat affect Overall Cognitive Status: Impaired/Different from baseline Area of Impairment: Attention, Memory, Following commands, Safety/judgement, Awareness, Problem solving                   Current Attention Level: Sustained Memory: Decreased short-term memory, Decreased recall of precautions Following Commands: Follows one step commands consistently, Follows one step commands with increased time, Follows multi-step commands inconsistently Safety/Judgement: Decreased awareness of safety, Decreased awareness of deficits Awareness: Emergent Problem Solving: Slow processing, Requires verbal cues, Difficulty sequencing General Comments: pt awake and engaging with therapist.  Pt with poor awareness to deficits and safety. No recall of NWB L UE.        Exercises      Shoulder Instructions       General Comments VSS on RA, discussed importance of elevation of LUE.  Educated on use of sling during mobility for comfort due to weight of UE.   Also discussed recommendations to have assist with all ADLs, mobility and IADLs at this time.    Pertinent Vitals/ Pain       Pain Assessment Pain Assessment: Faces Faces Pain Scale: Hurts even more Pain Location: L arm and ribcage Pain Descriptors / Indicators: Aching, Grimacing, Guarding Pain Intervention(s): Limited activity within patient's tolerance, Monitored during session, Repositioned  Home Living                                          Prior Functioning/Environment              Frequency  Min 2X/week        Progress Toward Goals  OT Goals(current goals can now be found in the care plan section)  Progress towards OT goals: Progressing toward goals  Acute Rehab OT Goals Patient Stated Goal:  home OT Goal Formulation: With patient Time For Goal Achievement: 01/09/22 Potential to Achieve Goals: Good  Plan Discharge plan remains appropriate;Frequency remains appropriate    Co-evaluation                 AM-PAC OT "6 Clicks" Daily Activity     Outcome Measure   Help from another person eating meals?: A Little Help from another person taking care of personal grooming?: A Little Help from another person toileting, which includes using toliet, bedpan, or urinal?: A Lot Help from another person bathing (including washing, rinsing, drying)?: A Little Help from another person to put on and taking off regular upper body clothing?: A Lot Help from another person to put on and taking off regular lower body clothing?: A Little 6 Click Score: 16    End of Session    OT Visit Diagnosis: Unsteadiness on feet (R26.81);Other abnormalities of gait and mobility (R26.89);Muscle weakness (generalized) (M62.81)   Activity Tolerance Patient tolerated treatment well   Patient Left with call bell/phone within reach;with bed alarm set;in chair;with chair alarm set   Nurse Communication Mobility status  Time: 7989-2119 OT Time Calculation (min): 32 min  Charges: OT General Charges $OT Visit: 1 Visit OT Treatments $Self Care/Home Management : 23-37 mins  Barry Brunner, OT Acute Rehabilitation Services Office [phone]   Chancy Milroy 12/28/2021, 9:54 AM

## 2021-12-28 NOTE — Progress Notes (Signed)
PROGRESS NOTE    Jasmine Benson  TIW:[id number] DOB: June 08, 1945 DOA: 12/24/2021 PCP: Soundra Pilon, FNP   Brief Narrative: 76 year old with past medical history significant for hyperlipidemia, depression, alcohol intake daily presented to the hospital on 12/24/2021 complaining of acute chest wall pain, left wrist pain after a fall.  Patient was found by a good [employer]aritan down at bottom of some stair.      Assessment & Plan:   Principal Problem:   Chest wall pain Active Problems:   Left wrist fracture, closed, initial encounter   Fall at home, initial encounter   Acute alcohol intoxication (HCC)   Lactic acidosis   Acute encephalopathy   Depression   Chronic alcohol abuse   Hyperlipidemia  1-Fall, Acute Left Wrist Fracture: -X-ray: Comminuted impacted displaced fracture of the tip of the ulna styloid process.  -Displaced fracture of the distal radial metaphysis extending to the articular surface. -Pelvis x-ray: Subacute fracture of the bilateral sacral alla and left superior and inferior pubic Ramey. -CT head: No evidence of acute intracranial abnormality.  Left anterior scalp hematoma and laceration. -CT cervical spine: No evidence of acute fracture -CT maxillofacial no evidence of acute maxillofacial fracture. -CT chest abdomen and pelvis: No acute intrathoracic or intra-abdominal or intrapelvic abnormality. Evaluated by trauma team, no further recommendation pain management. Dr. Yehuda Budd consulted. He recommend NWB to left extremity and follow up in the office Pain management. He discussed with ED immobilization.   Acute alcohol intoxication: Monitor on CIWA.  IV fluids.  Thiamine, folic acid CIWA score down to 0 . Continue with ativan PRN  Lactic acidosis: Resolved.  Continue with IV fluids.  UA: Negative  Acute Metabolic encephalopathy: ? Alacohol intake.  She is alert and conversant.  Alcohol level 191  Hyperlipidemia  Hypomagnesemia; Replete.    Shoulder pain; Mild acromioclavicular degenerative change. Tiny subacromial spur. Pain management.  Hypotension; will give IV Bolus.   Estimated body mass index is 26.74 kg/m as calculated from the following:   Height as of this encounter: 5\' 5"  (1.651 m).   Weight as of this encounter: 72.9 kg.   DVT prophylaxis: SCD Code Status: Full code Family Communication: Care discussed with patient. Step son over phone Disposition Plan:  Status is: Observation The patient remains OBS appropriate and will d/c before 2 midnights.    Consultants:  Ortho Surgery   Procedures:  none  Antimicrobials:    Subjective: She doesn't feel ready to be discharge today.  She report hand pain.  She would consider rehab, but she wants to go home   Objective: Vitals:   12/28/21 0323 12/28/21 0337 12/28/21 0804 12/28/21 1030  BP: 105/76  96/71 103/64  Pulse: 99  91 (!) 102  Resp: 20  18   Temp: 98 F (36.7 C)  98.3 F (36.8 C) 97.6 F (36.4 C)  TempSrc: Oral  Oral Oral  SpO2: 98%  100% 94%  Weight:  72.9 kg    Height:        Intake/Output Summary (Last 24 hours) at 12/28/2021 1326 Last data filed at 12/28/2021 1000 Gross per 24 hour  Intake 871.25 ml  Output 1050 ml  Net -178.75 ml    Filed Weights   12/26/21 0500 12/27/21 0300 12/28/21 0337  Weight: 69.9 kg 70.9 kg 72.9 kg    Examination:  General exam: NAD Respiratory system: CTA Cardiovascular system: S 1, S 2 RRR Gastrointestinal system: BS present, soft, nt Central nervous system: Alert, follows command Extremities: Left arm  splint    Data Reviewed: I have personally reviewed following labs and imaging studies  CBC: Recent Labs  Lab 12/24/21 1025 12/24/21 1036 12/25/21 0446 12/26/21 0755 12/27/21 0140  WBC 4.7  --  5.0 6.1 7.1  NEUTROABS  --   --  3.2  --   --   HGB 12.3 13.3 10.6* 10.5* 10.3*  HCT 37.3 39.0 31.3* 31.1* 29.5*  MCV 109.7*  --  109.8* 110.7* 106.9*  PLT 247  --  205 198 204    Basic  Metabolic Panel: Recent Labs  Lab 12/24/21 1025 12/24/21 1036 12/25/21 0446 12/26/21 0755 12/27/21 0140  NA 138 136 137 132* 133*  K 3.8 3.7 4.2 3.4* 3.8  CL 106 104 103 102 103  CO2 19*  --  24 23 22   GLUCOSE 111* 107* 106* 100* 88  BUN 13 13 7* 5* 7*  CREATININE 0.73 1.00 0.72 0.60 0.53  CALCIUM 9.4  --  8.7* 8.1* 8.2*  MG  --   --  1.6* 1.7 1.8  PHOS  --   --  3.4  --   --     GFR: Estimated Creatinine Clearance: 60.8 mL/min (by C-G formula based on SCr of 0.53 mg/dL). Liver Function Tests: Recent Labs  Lab 12/24/21 1025 12/25/21 0446  AST 45* 31  ALT 29 21  ALKPHOS 46 43  BILITOT 0.6 1.0  PROT 6.7 5.6*  ALBUMIN 3.6 3.0*    No results for input(s): "LIPASE", "AMYLASE" in the last 168 hours. No results for input(s): "AMMONIA" in the last 168 hours. Coagulation Profile: Recent Labs  Lab 12/24/21 1120  INR 1.1    Cardiac Enzymes: No results for input(s): "CKTOTAL", "CKMB", "CKMBINDEX", "TROPONINI" in the last 168 hours. BNP (last 3 results) No results for input(s): "PROBNP" in the last 8760 hours. HbA1C: No results for input(s): "HGBA1C" in the last 72 hours. CBG: No results for input(s): "GLUCAP" in the last 168 hours. Lipid Profile: No results for input(s): "CHOL", "HDL", "LDLCALC", "TRIG", "CHOLHDL", "LDLDIRECT" in the last 72 hours. Thyroid Function Tests: No results for input(s): "TSH", "T4TOTAL", "FREET4", "T3FREE", "THYROIDAB" in the last 72 hours. Anemia Panel: No results for input(s): "VITAMINB12", "FOLATE", "FERRITIN", "TIBC", "IRON", "RETICCTPCT" in the last 72 hours. Sepsis Labs: Recent Labs  Lab 12/24/21 1010 12/24/21 1540 12/25/21 0446  LATICACIDVEN 3.6* 2.4* 1.2     Recent Results (from the past 240 hour(s))  Resp Panel by RT-PCR (Flu A&B, Covid) Anterior Nasal Swab     Status: None   Collection Time: 12/24/21  5:02 PM   Specimen: Anterior Nasal Swab  Result Value Ref Range Status   SARS Coronavirus 2 by RT PCR NEGATIVE NEGATIVE  Final    Comment: (NOTE) SARS-CoV-2 target nucleic acids are NOT DETECTED.  The SARS-CoV-2 RNA is generally detectable in upper respiratory specimens during the acute phase of infection. The lowest concentration of SARS-CoV-2 viral copies this assay can detect is 138 copies/mL. A negative result does not preclude SARS-Cov-2 infection and should not be used as the sole basis for treatment or other patient management decisions. A negative result may occur with  improper specimen collection/handling, submission of specimen other than nasopharyngeal swab, presence of viral mutation(s) within the areas targeted by this assay, and inadequate number of viral copies(<138 copies/mL). A negative result must be combined with clinical observations, patient history, and epidemiological information. The expected result is Negative.  Fact Sheet for Patients:  13/02/23  Fact Sheet for Healthcare Providers:  BloggerCourse.com  This test is no t yet approved or cleared by the [place] and  has been authorized for detection and/or diagnosis of SARS-CoV-2 by FDA under an Emergency Use Authorization (EUA). This EUA will remain  in effect (meaning this test can be used) for the duration of the COVID-19 declaration under Section 564(b)(1) of the Act, 21 U.S.C.section 360bbb-3(b)(1), unless the authorization is terminated  or revoked sooner.       Influenza A by PCR NEGATIVE NEGATIVE Final   Influenza B by PCR NEGATIVE NEGATIVE Final    Comment: (NOTE) The Xpert Xpress SARS-CoV-2/FLU/RSV plus assay is intended as an aid in the diagnosis of influenza from Nasopharyngeal swab specimens and should not be used as a sole basis for treatment. Nasal washings and aspirates are unacceptable for Xpert Xpress SARS-CoV-2/FLU/RSV testing.  Fact Sheet for Patients: EntrepreneurPulse.com.au  Fact Sheet for Healthcare  Providers: IncredibleEmployment.be  This test is not yet approved or cleared by the [place] FDA and has been authorized for detection and/or diagnosis of SARS-CoV-2 by FDA under an Emergency Use Authorization (EUA). This EUA will remain in effect (meaning this test can be used) for the duration of the COVID-19 declaration under Section 564(b)(1) of the Act, 21 U.S.C. section 360bbb-3(b)(1), unless the authorization is terminated or revoked.  Performed at [facility], [place] [address] [place], [place] [address]          Radiology Studies: DG Shoulder Left  Result Date: 12/26/2021 CLINICAL DATA:  Left shoulder pain. EXAM: LEFT SHOULDER - 2+ VIEW COMPARISON:  Report mass from chest CT 12/24/2021 FINDINGS: The bones are subjectively under mineralized. There is no evidence of fracture or dislocation. Mild acromioclavicular degenerative change. Tiny subacromial spur. Soft tissues are unremarkable. IMPRESSION: Mild acromioclavicular degenerative change. Tiny subacromial spur. Electronically Signed   By: Keith Rake M.D.   On: 12/26/2021 15:16        Scheduled Meds:  citalopram  20 mg Oral Daily   ezetimibe  10 mg Oral QHS   folic acid  1 mg Oral Daily   LORazepam  0-4 mg Intravenous Q8H   multivitamin  1 tablet Oral Daily   thiamine  100 mg Oral Daily   Or   thiamine  100 mg Intravenous Daily   Continuous Infusions:  sodium chloride 75 mL/hr at 12/28/21 1042   sodium chloride       LOS: 3 days    Time spent: 35 minutes.     Elmarie Shiley, MD [facility]   If 7PM-7AM, please contact night-coverage [email or web address]  12/28/2021, 1:26 PM

## 2021-12-28 NOTE — Progress Notes (Signed)
Mobility Specialist Progress Note:   12/28/21 1130  Mobility  Activity Ambulated with assistance in room  Level of Assistance Contact guard assist, steadying assist  Assistive Device None  Distance Ambulated (ft) 40 ft  Activity Response Tolerated well  $Mobility charge 1 Mobility   Pt received in bed willing to participate in mobility. Complaints of L shoulder pain. Left in bed with call bell in reach and all needs met.   [facility] Surveyor, mining Chat only

## 2021-12-29 DIAGNOSIS — R0789 Other chest pain: Secondary | ICD-10-CM | POA: Diagnosis not present

## 2021-12-29 MED ORDER — VITAMIN B-1 100 MG PO TABS: 100.0000 mg | ORAL_TABLET | Freq: Every day | ORAL | 0 refills | Status: AC

## 2021-12-29 MED ORDER — OXYCODONE-ACETAMINOPHEN 5-325 MG PO TABS: 1.0000 | ORAL_TABLET | Freq: Four times a day (QID) | ORAL | 0 refills | Status: AC | PRN

## 2021-12-29 MED ORDER — METHOCARBAMOL 500 MG PO TABS: 500.0000 mg | ORAL_TABLET | Freq: Three times a day (TID) | ORAL | 0 refills | Status: DC | PRN

## 2021-12-29 NOTE — Discharge Summary (Signed)
Physician Discharge Summary   Patient: Jasmine Benson MRN: [id number] DOB: 03/05/1945  Admit date:     12/24/2021  Discharge date: 12/29/21  Discharge Physician: Alba Cory   PCP: Soundra Pilon, FNP   Recommendations at discharge:    Needs to follow up with Dr Yehuda Budd for further care of wrist fracture.  Encourage alcohol cessation.   Discharge Diagnoses: Principal Problem:   Chest wall pain Active Problems:   Left wrist fracture, closed, initial encounter   Fall at home, initial encounter   Acute alcohol intoxication (HCC)   Lactic acidosis   Acute encephalopathy   Depression   Chronic alcohol abuse   Hyperlipidemia  Resolved Problems:   * No resolved hospital problems. [facility] Course: 76 year old with past medical history significant for hyperlipidemia, depression, alcohol intake daily presented to the hospital on 12/24/2021 complaining of acute chest wall pain, left wrist pain after a fall.  Patient was found by a good Samaritan down at bottom of some stair.     Assessment and Plan:  1-Fall, Acute Left Wrist Fracture: -X-ray: Comminuted impacted displaced fracture of the tip of the ulna styloid process.  -Displaced fracture of the distal radial metaphysis extending to the articular surface. -Pelvis x-ray: Subacute fracture of the bilateral sacral alla and left superior and inferior pubic Ramey. -CT head: No evidence of acute intracranial abnormality.  Left anterior scalp hematoma and laceration. -CT cervical spine: No evidence of acute fracture -CT maxillofacial no evidence of acute maxillofacial fracture. -CT chest abdomen and pelvis: No acute intrathoracic or intra-abdominal or intrapelvic abnormality. Evaluated by trauma team, no further recommendation pain management. Dr. Yehuda Budd consulted. He recommend NWB to left extremity and follow up in the office Pain management. He discussed with ED immobilization. Will provided few days of opioid. Counseling  provided to patent about use of alcohol and opioids.    Acute alcohol intoxication: Monitor on CIWA.  Treated with IV fluids.  Thiamine, folic acid CIWA score down to 0 . Continue with ativan PRN   Lactic acidosis: Resolved.  Treated with IV fluids.  UA: Negative   Acute Metabolic encephalopathy: ? Alacohol intake.  She is alert and conversant.  Alcohol level 191 Resolved.   Hyperlipidemia   Hypomagnesemia; Replaced.  Shoulder pain; Mild acromioclavicular degenerative change. Tiny subacromial spur. Pain management.  Hypotension;  resolved with fluids.   Disposition; SNF for rehab was recommended for patient ,. She has declined. She wishes to go home. She will hire private caregiver. Her step son would be able to help her. .    Estimated body mass index is 26.74 kg/m as calculated from the following:   Height as of this encounter: 5\' 5"  (1.651 m).   Weight as of this encounter: 72.9 kg.            Consultants: Ortho Procedures performed: None Disposition: Home Diet recommendation:  Discharge Diet Orders (From admission, onward)     Start     Ordered   12/29/21 0000  Diet - low sodium heart healthy        12/29/21 1153           Cardiac diet DISCHARGE MEDICATION: Allergies as of 12/29/2021   No Known Allergies      Medication List     STOP taking these medications    acetaminophen 500 MG tablet Commonly known as: TYLENOL   amoxicillin 875 MG tablet Commonly known as: AMOXIL   ibuprofen 400 MG tablet Commonly known as:  ADVIL   lidocaine 5 % Commonly known as: LIDODERM   ondansetron 4 MG tablet Commonly known as: ZOFRAN   pantoprazole 40 MG tablet Commonly known as: PROTONIX       TAKE these medications    citalopram 20 MG tablet Commonly known as: CELEXA Take 20 mg by mouth daily.   ezetimibe 10 MG tablet Commonly known as: ZETIA Take 10 mg by mouth at bedtime.   FOLIC ACID PO Take 2 capsules by mouth daily.     methocarbamol 500 MG tablet Commonly known as: ROBAXIN Take 1 tablet (500 mg total) by mouth every 8 (eight) hours as needed for muscle spasms. What changed: how much to take   montelukast 10 MG tablet Commonly known as: SINGULAIR Take 10 mg by mouth daily.   oxyCODONE-acetaminophen 5-325 MG tablet Commonly known as: PERCOCET/ROXICET Take 1 tablet by mouth every 6 (six) hours as needed for up to 5 days for moderate pain or severe pain.   rosuvastatin 5 MG tablet Commonly known as: CRESTOR Take 5 mg by mouth 2 (two) times a week. Monday and Friday   thiamine 100 MG tablet Commonly known as: Vitamin B-1 Take 1 tablet (100 mg total) by mouth daily. Start taking on: December 30, 2021   traZODone 150 MG tablet Commonly known as: DESYREL Take 150 mg by mouth at bedtime.   VITAMIN D3 PO Take 1 capsule by mouth daily.        Follow-up Information     Gomez Cleverly, MD Follow up in 1 week(s).   Specialty: Orthopedic Surgery Contact information: [address] [place] [address] [phone]         [facility] Follow up.   Specialty: Home Health Services Why: HHRN/PT/OT/aide/SW arranged- they will contact you post discharge to schedule Contact information: [address] [place] [address] [phone]                Discharge Exam: Filed Weights   12/27/21 0300 12/28/21 0337 12/29/21 0100  Weight: 70.9 kg 72.9 kg 68.2 kg   General; NAD  Condition at discharge: stable  The results of significant diagnostics from this hospitalization (including imaging, microbiology, ancillary and laboratory) are listed below for reference.   Imaging Studies: DG Shoulder Left  Result Date: 12/26/2021 CLINICAL DATA:  Left shoulder pain. EXAM: LEFT SHOULDER - 2+ VIEW COMPARISON:  Report mass from chest CT 12/24/2021 FINDINGS: The bones are subjectively under mineralized. There is no evidence of fracture or dislocation. Mild  acromioclavicular degenerative change. Tiny subacromial spur. Soft tissues are unremarkable. IMPRESSION: Mild acromioclavicular degenerative change. Tiny subacromial spur. Electronically Signed   By: Narda Rutherford M.D.   On: 12/26/2021 15:16   DG Wrist Complete Left  Result Date: 12/24/2021 CLINICAL DATA:  Left wrist pain. EXAM: LEFT WRIST - COMPLETE 3+ VIEW COMPARISON:  None Available. FINDINGS: Generalized osteopenia. Comminuted impacted displaced fracture of the tip of the ulnar styloid process. Displaced fracture of the distal radial metaphysis extending to the articular surface. No aggressive osseous lesion. Normal alignment. Severe osteoarthritis of the first [facility] joint. Moderate osteoarthritis of the first MCP joint and first IP joint. 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 Soft tissue are unremarkable. No radiopaque foreign body or soft tissue emphysema. IMPRESSION: 1. Comminuted impacted displaced fracture of the tip of the ulnar styloid process. 2. Displaced fracture of the distal radial metaphysis extending to the articular surface. Electronically Signed   By: Alan Ripper  Patel M.D.   On: 12/24/2021 12:09   DG Wrist Complete Left  Result Date: 12/24/2021 CLINICAL DATA:  Left wrist pain. EXAM: LEFT WRIST - COMPLETE 3+ VIEW COMPARISON:  None Available. FINDINGS: Generalized osteopenia. Comminuted impacted displaced fracture of the tip of the ulnar styloid process. Displaced fracture of the distal radial metaphysis extending to the articular surface. No aggressive osseous lesion. Normal alignment. Severe osteoarthritis of the first [facility] joint. Moderate osteoarthritis of the first MCP joint and first IP joint. 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 Soft tissue are unremarkable. No radiopaque foreign body or soft tissue emphysema. IMPRESSION: 1. Comminuted impacted displaced fracture of the tip of the ulnar styloid process. 2. Displaced fracture of the distal radial  metaphysis extending to the articular surface. Electronically Signed   By: Elige Ko M.D.   On: 12/24/2021 12:09   CT HEAD WO CONTRAST  Result Date: 12/24/2021 CLINICAL DATA:  Provided history: Facial trauma, blunt. Head trauma, moderate/severe. Neck trauma, abnormal mental status or neuro exam. Fall. EXAM: CT HEAD WITHOUT CONTRAST CT MAXILLOFACIAL WITHOUT CONTRAST CT CERVICAL SPINE WITHOUT CONTRAST TECHNIQUE: Multidetector CT imaging of the head, cervical spine, and maxillofacial structures were performed using the standard protocol without intravenous contrast. Multiplanar CT image reconstructions of the cervical spine and maxillofacial structures were also generated. RADIATION DOSE REDUCTION: This exam was performed according to the departmental dose-optimization program which includes automated exposure control, adjustment of the mA and/or kV according to patient size and/or use of iterative reconstruction technique. COMPARISON:  CT head/cervical spine 10/15/2016. FINDINGS: CT HEAD FINDINGS Brain: Mild generalized cerebral atrophy. There is no acute intracranial hemorrhage. No demarcated cortical infarct. No extra-axial fluid collection. No evidence of an intracranial mass. No midline shift. Vascular: No hyperdense vessel. Atherosclerotic calcifications. Skull: No fracture or aggressive osseous lesion. Other: Left anterior scalp hematoma and laceration. CT MAXILLOFACIAL FINDINGS Osseous: No evidence of acute maxillofacial fracture. Anterior position of the mandibular condyles, bilaterally. Orbits: No acute orbital finding. Sinuses: Minimal mucosal thickening within the anterior ethmoid air cells, bilaterally. Soft tissues: No significant maxillofacial soft tissue swelling appreciable by CT. CT CERVICAL SPINE FINDINGS Mildly motion degraded exam. Alignment: Straightening of the expected cervical lordosis. Slight C2-C3 grade 1 anterolisthesis. 4 mm C7-T1 grade 1 anterolisthesis. Skull base and vertebrae:  The basion-dental and atlanto-dental intervals are maintained.No evidence of acute fracture to the cervical spine. Soft tissues and spinal canal: No prevertebral fluid or swelling. No visible canal hematoma. Disc levels: Cervical spondylosis with multilevel disc space narrowing, disc bulges, posterior disc osteophyte complexes, endplate spurring, uncovertebral hypertrophy and facet arthrosis. Disc space narrowing is greatest at C3-C4, C4-C5, C5-C6, C6-C7 and C7-T1 (advanced at these levels). No appreciable high-grade spinal canal stenosis. Multilevel bony neural foraminal narrowing. Degenerative changes about the C1-C2 articulation. Upper chest: No consolidation within the imaged lung apices. No visible pneumothorax. IMPRESSION: CT head: 1. No evidence of acute intracranial abnormality. 2. Left anterior scalp hematoma and laceration. 3. Mild generalized cerebral atrophy. CT maxillofacial: 1. No evidence of acute maxillofacial fracture. 2. Anterior position of the bilateral mandibular condyles. Correlate with physical exam findings to exclude TMJ dislocation. CT cervical spine: 1. Mildly motion degraded exam. 2. No evidence of acute fracture to the cervical spine. 3. Grade 1 anterolisthesis at C2-C3 and C7-T1. 4. Cervical spondylosis, as described. Electronically Signed   By: Jackey Loge D.O.   On: 12/24/2021 11:30   CT MAXILLOFACIAL WO CONTRAST  Result Date: 12/24/2021 CLINICAL DATA:  Provided history: Facial  trauma, blunt. Head trauma, moderate/severe. Neck trauma, abnormal mental status or neuro exam. Fall. EXAM: CT HEAD WITHOUT CONTRAST CT MAXILLOFACIAL WITHOUT CONTRAST CT CERVICAL SPINE WITHOUT CONTRAST TECHNIQUE: Multidetector CT imaging of the head, cervical spine, and maxillofacial structures were performed using the standard protocol without intravenous contrast. Multiplanar CT image reconstructions of the cervical spine and maxillofacial structures were also generated. RADIATION DOSE REDUCTION: This  exam was performed according to the departmental dose-optimization program which includes automated exposure control, adjustment of the mA and/or kV according to patient size and/or use of iterative reconstruction technique. COMPARISON:  CT head/cervical spine 10/15/2016. FINDINGS: CT HEAD FINDINGS Brain: Mild generalized cerebral atrophy. There is no acute intracranial hemorrhage. No demarcated cortical infarct. No extra-axial fluid collection. No evidence of an intracranial mass. No midline shift. Vascular: No hyperdense vessel. Atherosclerotic calcifications. Skull: No fracture or aggressive osseous lesion. Other: Left anterior scalp hematoma and laceration. CT MAXILLOFACIAL FINDINGS Osseous: No evidence of acute maxillofacial fracture. Anterior position of the mandibular condyles, bilaterally. Orbits: No acute orbital finding. Sinuses: Minimal mucosal thickening within the anterior ethmoid air cells, bilaterally. Soft tissues: No significant maxillofacial soft tissue swelling appreciable by CT. CT CERVICAL SPINE FINDINGS Mildly motion degraded exam. Alignment: Straightening of the expected cervical lordosis. Slight C2-C3 grade 1 anterolisthesis. 4 mm C7-T1 grade 1 anterolisthesis. Skull base and vertebrae: The basion-dental and atlanto-dental intervals are maintained.No evidence of acute fracture to the cervical spine. Soft tissues and spinal canal: No prevertebral fluid or swelling. No visible canal hematoma. Disc levels: Cervical spondylosis with multilevel disc space narrowing, disc bulges, posterior disc osteophyte complexes, endplate spurring, uncovertebral hypertrophy and facet arthrosis. Disc space narrowing is greatest at C3-C4, C4-C5, C5-C6, C6-C7 and C7-T1 (advanced at these levels). No appreciable high-grade spinal canal stenosis. Multilevel bony neural foraminal narrowing. Degenerative changes about the C1-C2 articulation. Upper chest: No consolidation within the imaged lung apices. No visible  pneumothorax. IMPRESSION: CT head: 1. No evidence of acute intracranial abnormality. 2. Left anterior scalp hematoma and laceration. 3. Mild generalized cerebral atrophy. CT maxillofacial: 1. No evidence of acute maxillofacial fracture. 2. Anterior position of the bilateral mandibular condyles. Correlate with physical exam findings to exclude TMJ dislocation. CT cervical spine: 1. Mildly motion degraded exam. 2. No evidence of acute fracture to the cervical spine. 3. Grade 1 anterolisthesis at C2-C3 and C7-T1. 4. Cervical spondylosis, as described. Electronically Signed   By: Jackey LogeKyle  Golden D.O.   On: 12/24/2021 11:30   CT Cervical Spine Wo Contrast  Result Date: 12/24/2021 CLINICAL DATA:  Provided history: Facial trauma, blunt. Head trauma, moderate/severe. Neck trauma, abnormal mental status or neuro exam. Fall. EXAM: CT HEAD WITHOUT CONTRAST CT MAXILLOFACIAL WITHOUT CONTRAST CT CERVICAL SPINE WITHOUT CONTRAST TECHNIQUE: Multidetector CT imaging of the head, cervical spine, and maxillofacial structures were performed using the standard protocol without intravenous contrast. Multiplanar CT image reconstructions of the cervical spine and maxillofacial structures were also generated. RADIATION DOSE REDUCTION: This exam was performed according to the departmental dose-optimization program which includes automated exposure control, adjustment of the mA and/or kV according to patient size and/or use of iterative reconstruction technique. COMPARISON:  CT head/cervical spine 10/15/2016. FINDINGS: CT HEAD FINDINGS Brain: Mild generalized cerebral atrophy. There is no acute intracranial hemorrhage. No demarcated cortical infarct. No extra-axial fluid collection. No evidence of an intracranial mass. No midline shift. Vascular: No hyperdense vessel. Atherosclerotic calcifications. Skull: No fracture or aggressive osseous lesion. Other: Left anterior scalp hematoma and laceration. CT MAXILLOFACIAL FINDINGS Osseous: No  evidence of  acute maxillofacial fracture. Anterior position of the mandibular condyles, bilaterally. Orbits: No acute orbital finding. Sinuses: Minimal mucosal thickening within the anterior ethmoid air cells, bilaterally. Soft tissues: No significant maxillofacial soft tissue swelling appreciable by CT. CT CERVICAL SPINE FINDINGS Mildly motion degraded exam. Alignment: Straightening of the expected cervical lordosis. Slight C2-C3 grade 1 anterolisthesis. 4 mm C7-T1 grade 1 anterolisthesis. Skull base and vertebrae: The basion-dental and atlanto-dental intervals are maintained.No evidence of acute fracture to the cervical spine. Soft tissues and spinal canal: No prevertebral fluid or swelling. No visible canal hematoma. Disc levels: Cervical spondylosis with multilevel disc space narrowing, disc bulges, posterior disc osteophyte complexes, endplate spurring, uncovertebral hypertrophy and facet arthrosis. Disc space narrowing is greatest at C3-C4, C4-C5, C5-C6, C6-C7 and C7-T1 (advanced at these levels). No appreciable high-grade spinal canal stenosis. Multilevel bony neural foraminal narrowing. Degenerative changes about the C1-C2 articulation. Upper chest: No consolidation within the imaged lung apices. No visible pneumothorax. IMPRESSION: CT head: 1. No evidence of acute intracranial abnormality. 2. Left anterior scalp hematoma and laceration. 3. Mild generalized cerebral atrophy. CT maxillofacial: 1. No evidence of acute maxillofacial fracture. 2. Anterior position of the bilateral mandibular condyles. Correlate with physical exam findings to exclude TMJ dislocation. CT cervical spine: 1. Mildly motion degraded exam. 2. No evidence of acute fracture to the cervical spine. 3. Grade 1 anterolisthesis at C2-C3 and C7-T1. 4. Cervical spondylosis, as described. Electronically Signed   By: Jackey Loge D.O.   On: 12/24/2021 11:30   CT CHEST ABDOMEN PELVIS W CONTRAST  Result Date: 12/24/2021 CLINICAL DATA:  Poly  trauma, fall, head injury EXAM: CT CHEST, ABDOMEN, AND PELVIS WITH CONTRAST TECHNIQUE: Multidetector CT imaging of the chest, abdomen and pelvis was performed following the standard protocol during bolus administration of intravenous contrast. RADIATION DOSE REDUCTION: This exam was performed according to the departmental dose-optimization program which includes automated exposure control, adjustment of the mA and/or kV according to patient size and/or use of iterative reconstruction technique. CONTRAST:  75mL OMNIPAQUE IOHEXOL 350 MG/ML SOLN IV. No oral contrast. COMPARISON:  Hip CT 05/16/2021 FINDINGS: CT CHEST FINDINGS Cardiovascular: Atherosclerotic calcifications aorta. Aorta normal caliber. Heart unremarkable. No pericardial effusion. Mediastinum/Nodes: Esophagus unremarkable. Probable small hiatal hernia. Capsular calcification at BILATERAL breast prostheses. No thoracic adenopathy. Base of cervical region normal appearance. Lungs/Pleura: Minimal bibasilar atelectasis versus scarring. Minimal biapical scarring. Lungs otherwise clear. No acute infiltrate, pleural effusion, or pneumothorax. Musculoskeletal: Diffuse osseous demineralization. Multiple old healed LEFT rib fractures. RIGHT glenohumeral degenerative changes. Chronic appearing compression deformity of T7 vertebral body. CT ABDOMEN PELVIS FINDINGS Hepatobiliary: Gallbladder and liver normal appearance Pancreas: Normal appearance Spleen: Normal appearance Adrenals/Urinary Tract: Adrenal glands, kidneys, ureters, and bladder normal appearance. Stomach/Bowel: Appendix not visualized. Diverticulosis of descending and sigmoid colon without evidence of diverticulitis. Bowel loops otherwise unremarkable. Vascular/Lymphatic: Atherosclerotic calcifications aorta and iliac arteries without aneurysm. No adenopathy. Reproductive: Uterus surgically absent.  Unremarkable adnexa. Other: No free air or free fluid. Tiny umbilical hernia containing fat. No inflammatory  process. Musculoskeletal: RIGHT hip prosthesis. Osseous demineralization. Subacute healing fractures of the LEFT pubic bones. 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 IMPRESSION: No acute intrathoracic, intra-abdominal, or intrapelvic abnormalities. Distal colonic diverticulosis without evidence of diverticulitis. Tiny umbilical hernia containing fat. Subacute healing fractures of the LEFT pubic bones. Chronic appearing compression deformity of T7 vertebral body. Aortic Atherosclerosis (ICD10-I70.0). Electronically Signed   By: Ulyses Southward M.D.   On: 12/24/2021 11:23   DG Pelvis Portable  Result Date:  12/24/2021 CLINICAL DATA:  Unwitnessed fall EXAM: PORTABLE PELVIS 1-2 VIEWS COMPARISON:  CT 05/16/2021, 05/15/2021 FINDINGS: Prior right hip hemiarthroplasty. Subacute fractures of the bilateral sacral ala and left superior and inferior pubic rami. No definite acute fracture. No pelvic diastasis. IMPRESSION: Subacute fractures of the bilateral sacral ala and left superior and inferior pubic rami. No definite acute fracture. Electronically Signed   By: Davina Poke D.O.   On: 12/24/2021 10:49   DG Chest Port 1 View  Result Date: 12/24/2021 CLINICAL DATA:  Unwitnessed fall EXAM: PORTABLE CHEST 1 VIEW COMPARISON:  05/17/2021 FINDINGS: Stable cardiomediastinal contours. Aortic atherosclerosis. No focal airspace consolidation, pleural effusion, or pneumothorax. Remote appearing bilateral rib deformities. Bilateral breast prostheses. IMPRESSION: No active disease. Electronically Signed   By: Davina Poke D.O.   On: 12/24/2021 10:47    Microbiology: Results for orders placed or performed during the hospital encounter of 12/24/21  Resp Panel by RT-PCR (Flu A&B, Covid) Anterior Nasal Swab     Status: None   Collection Time: 12/24/21  5:02 PM   Specimen: Anterior Nasal Swab  Result Value Ref Range Status   SARS Coronavirus 2 by RT PCR NEGATIVE NEGATIVE Final     Comment: (NOTE) SARS-CoV-2 target nucleic acids are NOT DETECTED.  The SARS-CoV-2 RNA is generally detectable in upper respiratory specimens during the acute phase of infection. The lowest concentration of SARS-CoV-2 viral copies this assay can detect is 138 copies/mL. A negative result does not preclude SARS-Cov-2 infection and should not be used as the sole basis for treatment or other patient management decisions. A negative result may occur with  improper specimen collection/handling, submission of specimen other than nasopharyngeal swab, presence of viral mutation(s) within the areas targeted by this assay, and inadequate number of viral copies(<138 copies/mL). A negative result must be combined with clinical observations, patient history, and epidemiological information. The expected result is Negative.  Fact Sheet for Patients:  EntrepreneurPulse.com.au  Fact Sheet for Healthcare Providers:  IncredibleEmployment.be  This test is no t yet approved or cleared by the [place] FDA and  has been authorized for detection and/or diagnosis of SARS-CoV-2 by FDA under an Emergency Use Authorization (EUA). This EUA will remain  in effect (meaning this test can be used) for the duration of the COVID-19 declaration under Section 564(b)(1) of the Act, 21 U.S.C.section 360bbb-3(b)(1), unless the authorization is terminated  or revoked sooner.       Influenza A by PCR NEGATIVE NEGATIVE Final   Influenza B by PCR NEGATIVE NEGATIVE Final    Comment: (NOTE) The Xpert Xpress SARS-CoV-2/FLU/RSV plus assay is intended as an aid in the diagnosis of influenza from Nasopharyngeal swab specimens and should not be used as a sole basis for treatment. Nasal washings and aspirates are unacceptable for Xpert Xpress SARS-CoV-2/FLU/RSV testing.  Fact Sheet for Patients: EntrepreneurPulse.com.au  Fact Sheet for Healthcare  Providers: IncredibleEmployment.be  This test is not yet approved or cleared by the [place] FDA and has been authorized for detection and/or diagnosis of SARS-CoV-2 by FDA under an Emergency Use Authorization (EUA). This EUA will remain in effect (meaning this test can be used) for the duration of the COVID-19 declaration under Section 564(b)(1) of the Act, 21 U.S.C. section 360bbb-3(b)(1), unless the authorization is terminated or revoked.  Performed at [facility], [place] [address], [place], [place] [address]     Labs: CBC: Recent Labs  Lab 12/24/21 1025 12/24/21 1036 12/25/21 0446 12/26/21 0755 12/27/21 0140  WBC 4.7  --  5.0 6.1 7.1  NEUTROABS  --   --  3.2  --   --   HGB 12.3 13.3 10.6* 10.5* 10.3*  HCT 37.3 39.0 31.3* 31.1* 29.5*  MCV 109.7*  --  109.8* 110.7* 106.9*  PLT 247  --  205 198 204   Basic Metabolic Panel: Recent Labs  Lab 12/24/21 1025 12/24/21 1036 12/25/21 0446 12/26/21 0755 12/27/21 0140  NA 138 136 137 132* 133*  K 3.8 3.7 4.2 3.4* 3.8  CL 106 104 103 102 103  CO2 19*  --  24 23 22   GLUCOSE 111* 107* 106* 100* 88  BUN 13 13 7* 5* 7*  CREATININE 0.73 1.00 0.72 0.60 0.53  CALCIUM 9.4  --  8.7* 8.1* 8.2*  MG  --   --  1.6* 1.7 1.8  PHOS  --   --  3.4  --   --    Liver Function Tests: Recent Labs  Lab 12/24/21 1025 12/25/21 0446  AST 45* 31  ALT 29 21  ALKPHOS 46 43  BILITOT 0.6 1.0  PROT 6.7 5.6*  ALBUMIN 3.6 3.0*   CBG: No results for input(s): "GLUCAP" in the last 168 hours.  Discharge time spent: greater than 30 minutes.  Signed: 13/03/23, MD [facility] 12/29/2021

## 2021-12-29 NOTE — Progress Notes (Signed)
Patient discharging home per order. IV removed without complications. Tele removed and CCMD notified. Discharge instructions given and medication administration discussed. All questions answered. Waiting for ride  [place] C Mia Milan

## 2021-12-29 NOTE — TOC Progression Note (Signed)
Transition of Care [facility]) - Progression Note    Patient Details  Name: Jasmine Benson MRN: [id number] Date of Birth: 12-14-1945  Transition of Care [facility]) CM/SW [place], [place] Phone Number: 12/29/2021, 2:13 PM  Clinical Narrative:     CSW met with patient. CSW introduced self, explained role and reason for consult. Patient declined ETOH resources.   Clinical Social Worker will sign off for now as social work intervention is no longer needed. Please consult [place] if new need arises.    Thurmond Butts, LCSW Clinical Social Worker   Expected Discharge Plan: [place] Barriers to Discharge: Barriers Resolved  Expected Discharge Plan and Services Expected Discharge Plan: [place] In-house Referral: Clinical Social Work Discharge Planning Services: CM Consult Post Acute Care Choice: Home Health, Durable Medical Equipment Living arrangements for the past 2 months: [place] Expected Discharge Date: 12/29/21               DME Arranged: N/A DME Agency: [place] Date DME Agency Contacted: 12/29/21 Time DME Agency Contacted: 27 Representative spoke with at DME Agency: Tommi Rumps HH Arranged: RN, PT, OT, Nurse's Aide, Social Work [employer] Agency: [place] Date [place]: 12/29/21 Time [place]: 1015 Representative spoke with at [place]: [place] ([place]) Interventions    Readmission Risk Interventions    12/29/2021   11:46 AM  Readmission Risk Prevention Plan  Post Dischage Appt Complete  Medication Screening Complete  Transportation Screening Complete

## 2021-12-29 NOTE — TOC Transition Note (Signed)
Transition of Care (TOC) - CM/SW Discharge Note Marvetta Gibbons RN, BSN Transitions of Care Unit 4E- RN Case Manager See Treatment Team for direct phone #   Patient Details  Name: Jasmine Benson MRN: [id number] Date of Birth: March 21, 1945  Transition of Care [facility]) CM/SW Contact:  Dawayne Patricia, RN Phone Number: 12/29/2021, 11:46 AM   Clinical Narrative:    Pt stable for transition home today, HH has been set up with Alvis Lemmings for HHRN/PT/OT/aide/SWAlvis Lemmings notified of discharge home today for start of care.   Per pt family to transport home, pt to f/u w/ Ortho- Continue splint, NWB- likely will need surgery at later date per Ortho note.   Pt has declined SNF at this time and states she has arranged for assistance at home.  No further TOC needs noted.   Final next level of care: [place] Barriers to Discharge: Barriers Resolved   Patient Goals and CMS Choice Patient states their goals for this hospitalization and ongoing recovery are:: return home CMS Medicare.gov Compare Post Acute Care list provided to:: Patient Choice offered to / list presented to : Patient  Discharge Placement               Home w/ [facility]        Discharge Plan and Services In-house Referral: Clinical Social Work Discharge Planning Services: CM Consult Post Acute Care Choice: Home Health, Durable Medical Equipment          DME Arranged: N/A DME Agency: [place] Date DME Agency Contacted: 12/29/21 Time DME Agency Contacted: [phone] Representative spoke with at DME Agency: Tommi Rumps HH Arranged: RN, PT, OT, Nurse's Aide, Social Work [employer] Agency: [place] Date [place]: 12/29/21 Time [place]: 1015 Representative spoke with at [place]: [place] ([place]) Interventions     Readmission Risk Interventions    12/29/2021   11:46 AM  Readmission Risk Prevention Plan  Post Dischage Appt Complete   Medication Screening Complete  Transportation Screening Complete

## 2021-12-29 NOTE — Progress Notes (Signed)
Occupational Therapy Treatment Patient Details Name: Jasmine Benson MRN: [id number] DOB: April 04, 1945 Today's Date: 12/29/2021   History of present illness 76 yo female admitted on 11/2 was found to have comminuted impacted displaced fracture of the tip of the ulnar styloid process, displaced fracture of the distal radial metaphysis extending to the articular surface.  Non surgical management in splint, awaiting need for surgery.  PMHx:  EtOH, falls   OT comments  Patient seated in recliner and agreeable to OT session.  Reviewed sling mgmt with intermittent min assist required, demonstrates ability to educate caregiver on how to assist her.  She is much clearer today, demonstrating good recall of NWB precautions, sling mgmt and recommendations of elevation to UE.  Discussed compensatory techniques for ADLs, aware of where she needs assist and reports having assist at home. She requires min assist for UB/LB dressing, supervision for transfers and toileting today.  Will follow acutely.    Recommendations for follow up therapy are one component of a multi-disciplinary discharge planning process, led by the attending physician.  Recommendations may be updated based on patient status, additional functional criteria and insurance authorization.    Follow Up Recommendations  Home health OT    Assistance Recommended at Discharge Frequent or constant Supervision/Assistance  Patient can return home with the following  A little help with walking and/or transfers;A little help with bathing/dressing/bathroom;Assistance with cooking/housework;Assist for transportation;Help with stairs or ramp for entrance   Equipment Recommendations  BSC/3in1    Recommendations for Other Services      Precautions / Restrictions Precautions Precautions: Fall Precaution Comments: NWB LUE Required Braces or Orthoses: Splint/Cast Splint/Cast: L wrist Restrictions Weight Bearing Restrictions: Yes LUE Weight Bearing:  Non weight bearing Other Position/Activity Restrictions: may use platform       Mobility Bed Mobility               General bed mobility comments: OOB in recliner upon entry    Transfers Overall transfer level: Needs assistance Equipment used: None Transfers: Sit to/from Stand Sit to Stand: Supervision                 Balance Overall balance assessment: Needs assistance Sitting-balance support: No upper extremity supported, Feet supported Sitting balance-Leahy Scale: Normal     Standing balance support: No upper extremity supported, During functional activity Standing balance-Leahy Scale: Fair                             ADL either performed or assessed with clinical judgement   ADL Overall ADL's : Needs assistance/impaired     Grooming: Standing;Supervision/safety;Wash/dry hands           Upper Body Dressing : Minimal assistance;Sitting   Lower Body Dressing: Minimal assistance;Sit to/from stand Lower Body Dressing Details (indicate cue type and reason): pt requires min assist for socks today, supervision in standing Toilet Transfer: Ambulation;Supervision/safety   Toileting- Clothing Manipulation and Hygiene: Supervision/safety;Sit to/from stand       Functional mobility during ADLs: Supervision/safety      Extremity/Trunk Assessment              Vision       Perception     Praxis      Cognition Arousal/Alertness: Awake/alert Behavior During Therapy: WFL for tasks assessed/performed Overall Cognitive Status: Within Functional Limits for tasks assessed  General Comments: pt pleasant and agreeable, eager for dc home today.  Much improved awareness to deficits, need for assistance and recall with recommendations.        Exercises      Shoulder Instructions       General Comments VSS on RA, worked on sling management with intermittent min assist able to verbalize to  caregiver on how to don.  Discussed sling schedule and elevation of UE when resting    Pertinent Vitals/ Pain       Pain Assessment Pain Assessment: Faces Faces Pain Scale: Hurts a little bit Pain Location: L arm and ribcage Pain Descriptors / Indicators: Aching, Grimacing, Guarding Pain Intervention(s): Limited activity within patient's tolerance, Monitored during session, Repositioned  Home Living                                          Prior Functioning/Environment              Frequency  Min 2X/week        Progress Toward Goals  OT Goals(current goals can now be found in the care plan section)  Progress towards OT goals: Progressing toward goals  Acute Rehab OT Goals Patient Stated Goal: home OT Goal Formulation: With patient Time For Goal Achievement: 01/09/22 Potential to Achieve Goals: Good  Plan Frequency remains appropriate;Discharge plan needs to be updated    Co-evaluation                 AM-PAC OT "6 Clicks" Daily Activity     Outcome Measure   Help from another person eating meals?: A Little Help from another person taking care of personal grooming?: A Little Help from another person toileting, which includes using toliet, bedpan, or urinal?: A Little Help from another person bathing (including washing, rinsing, drying)?: A Little Help from another person to put on and taking off regular upper body clothing?: A Little Help from another person to put on and taking off regular lower body clothing?: A Little 6 Click Score: 18    End of Session    OT Visit Diagnosis: Unsteadiness on feet (R26.81);Other abnormalities of gait and mobility (R26.89);Muscle weakness (generalized) (M62.81)   Activity Tolerance Patient tolerated treatment well   Patient Left in chair;with call bell/phone within reach;with chair alarm set   Nurse Communication Mobility status        Time: 7867-6720 OT Time Calculation (min): 20  min  Charges: OT General Charges $OT Visit: 1 Visit OT Treatments $Self Care/Home Management : 8-22 mins  [place] [phone]   Delight Stare 12/29/2021, 11:52 AM

## 2021-12-29 NOTE — Progress Notes (Addendum)
Physical Therapy Treatment Patient Details Name: Jasmine Benson MRN: [id number] DOB: [birth year] Today's Date: 12/29/2021   History of Present Illness 76 yo female admitted on 11/2 was found to have comminuted impacted displaced fracture of the tip of the ulnar styloid process, displaced fracture of the distal radial metaphysis extending to the articular surface.  Non surgical management in splint, awaiting need for surgery.  PMHx:  EtOH, falls    PT Comments    Pt seen for PT tx with pt agreeable. Pt is eager to d/c home today. PT reinforces when to don LUE sling & pt requires max assist to don/doff it for session. Pt declines walking in the hallway but is willing & able to ambulate multiple laps in room without AD & supervision. Pt engages in BLE strengthening exercise but is limited by fatigue & L rib pain. Updated d/c recommendations to HHPT due to pt's progress with functional mobility. Will continue to follow pt acutely to address balance, strengthening, activity tolerance, and gait.  Addendum: Pt reports she lives alone in a 2 level home but sleeps on sofa on main level where there is also a bathroom. Pt reports she only has a small threshold step to enter her home. 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 Used SPC PRN 2/2 aching joints with weather changes. Notes 1 fall in March 2023 which resulted in SNF rehab stay for about a month.    Recommendations for follow up therapy are one component of a multi-disciplinary discharge planning process, led by the attending physician.  Recommendations may be updated based on patient status, additional functional criteria and insurance authorization.  Follow Up Recommendations  Home health PT Can patient physically be transported by private vehicle: Yes   Assistance Recommended at Discharge Intermittent Supervision/Assistance  Patient can return home with the following A little help with walking  and/or transfers;A little help with bathing/dressing/bathroom;Assistance with cooking/housework;Help with stairs or ramp for entrance   Equipment Recommendations  None recommended by PT (pt has SPC she used PRN prior to admission)    Recommendations for Other Services       Precautions / Restrictions Precautions Precautions: Fall Precaution Comments: NWB LUE Required Braces or Orthoses: Splint/Cast Splint/Cast: L wrist Restrictions Weight Bearing Restrictions: Yes LUE Weight Bearing: Non weight bearing Other Position/Activity Restrictions: may use platform     Mobility  Bed Mobility               General bed mobility comments: pt received & left sitting EOB    Transfers Overall transfer level: Needs assistance Equipment used: None Transfers: Sit to/from Stand Sit to Stand: Supervision                Ambulation/Gait Ambulation/Gait assistance: Supervision Gait Distance (Feet): 100 Feet Assistive device: None Gait Pattern/deviations: Decreased step length - right, Decreased step length - left, Decreased stride length Gait velocity: slightly decreased     General Gait Details: Pt declines ambulating into hallway, requests to only ambulate laps in room. Ambulates door<>window x 4 times without AD & supervision without overt LOB. Gait distance limited by fatigue.   Stairs             Wheelchair Mobility    Modified Rankin (Stroke Patients Only)       Balance Overall balance assessment: Needs assistance Sitting-balance support: No upper extremity supported, Feet supported Sitting balance-Leahy Scale: Normal     Standing balance support: No upper extremity  supported, During functional activity Standing balance-Leahy Scale: Fair                              Cognition Arousal/Alertness: Awake/alert Behavior During Therapy: WFL for tasks assessed/performed Overall Cognitive Status: Within Functional Limits for tasks assessed                                  General Comments: Pt pleasant & agreeable to tx. Able to provide home set up information today.        Exercises Other Exercises Other Exercises: Pt attempts 5x STS from EOB without BUE support with focus on BLE strengthening & endurance training. On 4th STS repetition pt only able to partially stand & returns to sitting EOB. Pt reports fatigue & L rib pain & declines further attempts. Other Exercises: PT educates pt on when to use LUE sling (for standing/gait only) & pt requires MAX assist to don/doff sling.    General Comments General comments (skin integrity, edema, etc.): Pt on room air, max HR noted 112 bpm      Pertinent Vitals/Pain Pain Assessment Pain Assessment: Faces Faces Pain Scale: Hurts a little bit Pain Location: L arm and ribcage Pain Descriptors / Indicators: Aching, Grimacing, Guarding Pain Intervention(s): Monitored during session    Home Living                          Prior Function            PT Goals (current goals can now be found in the care plan section) Acute Rehab PT Goals Patient Stated Goal: to go home with stepson PT Goal Formulation: With patient Time For Goal Achievement: 01/08/22 Potential to Achieve Goals: Good Progress towards PT goals: Progressing toward goals    Frequency    Min 3X/week      PT Plan Discharge plan needs to be updated;Equipment recommendations need to be updated    Co-evaluation              AM-PAC PT "6 Clicks" Mobility   Outcome Measure  Help needed turning from your back to your side while in a flat bed without using bedrails?: None Help needed moving from lying on your back to sitting on the side of a flat bed without using bedrails?: A Little Help needed moving to and from a bed to a chair (including a wheelchair)?: A Little Help needed standing up from a chair using your arms (e.g., wheelchair or bedside chair)?: A Little Help needed to walk in  hospital room?: A Little Help needed climbing 3-5 steps with a railing? : A Little 6 Click Score: 19    End of Session   Activity Tolerance: Patient tolerated treatment well;Patient limited by pain;Patient limited by fatigue Patient left: with bed alarm set;with call bell/phone within reach (sitting EOB) Nurse Communication: Mobility status PT Visit Diagnosis: Unsteadiness on feet (R26.81);Pain;Difficulty in walking, not elsewhere classified (R26.2) Pain - Right/Left: Left Pain - part of body:  (rib)     Time: 3474-2595 PT Time Calculation (min) (ACUTE ONLY): 14 min  Charges:  $Therapeutic Activity: 8-22 mins                     Lavone Nian, PT, DPT 12/29/21, 10:38 AM   Waunita Schooner 12/29/2021, 10:33 AM

## 2021-12-29 NOTE — Consult Note (Signed)
Reason for Consult:Left wrist fx Referring Physician: Niel Benson Time called: [id number] Time at bedside: [address] Jasmine Benson is an 76 y.o. female.  HPI: Jasmine Benson was going up some steps on [facility] when she lost her balance and fell. She put out her hand to brake her fall and had immediate pain in her left wrist. She was brought to the ED and admitted. Hand surgery was consulted but was thought she was going home. 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; have some in my left knee" (10/05/2016)   HLD (hyperlipidemia)     Past Surgical History:  Procedure Laterality Date   APPENDECTOMY     AUGMENTATION MAMMAPLASTY Bilateral    CATARACT EXTRACTION W/ INTRAOCULAR LENS IMPLANT Right 07/13/2016   FRACTURE SURGERY     HIP ARTHROPLASTY Right 10/03/2016   Procedure: ARTHROPLASTY BIPOLAR HIP (HEMIARTHROPLASTY);  Surgeon: Marchia Bond, MD;  Location: [facility];  Service: Orthopedics;  Laterality: Right;   KNEE ARTHROSCOPY Left    "torn meniscus"   SYMPATHECTOMY Left 1966   "lumbar; opened me up"   TONSILLECTOMY     TUBAL LIGATION     VAGINAL HYSTERECTOMY      Family History  Problem Relation Age of Onset   Hypertension Mother    Hypertension Father     Social History:  reports that she has never smoked. She has never used smokeless tobacco. She reports current alcohol use of about 14.0 standard drinks of alcohol per week. She reports that she does not use drugs.  Allergies: No Known Allergies  Medications: I have reviewed the patient's current medications.  No results found for this or any previous visit (from the past 48 hour(s)).  No results found.  Review of Systems  HENT:  Negative for ear discharge, ear pain, hearing loss and tinnitus.   Eyes:  Negative for photophobia and pain.  Respiratory:  Negative for cough and shortness of breath.   Cardiovascular:  Negative for  chest pain.  Gastrointestinal:  Negative for abdominal pain, nausea and vomiting.  Genitourinary:  Negative for dysuria, flank pain, frequency and urgency.  Musculoskeletal:  Positive for arthralgias (Left wrist). Negative for back pain, myalgias and neck pain.  Neurological:  Negative for dizziness and headaches.  Hematological:  Does not bruise/bleed easily.  Psychiatric/Behavioral:  The patient is not nervous/anxious.    Blood pressure 115/77, pulse 85, temperature 98 F (36.7 C), temperature source Oral, resp. rate 18, height 5\' 5"  (1.651 m), weight 68.2 kg, SpO2 93 %. Physical Exam Constitutional:      General: She is not in acute distress.    Appearance: She is well-developed. She is not diaphoretic.  HENT:     Head: Normocephalic and atraumatic.  Eyes:     General: No scleral icterus.       Right eye: No discharge.        Left eye: No discharge.     Conjunctiva/sclera: Conjunctivae normal.  Cardiovascular:     Rate and Rhythm: Normal rate and regular rhythm.  Pulmonary:     Effort: Pulmonary effort is normal. No respiratory distress.  Musculoskeletal:     Cervical back: Normal range of motion.     Comments: Left shoulder, elbow, wrist, digits- Sugar tong splint in place, nontender, no instability, no blocks to motion  Sens  Ax/R/M/U intact  Mot   Ax/  R/ PIN/ M/ AIN/ U grossly intact  Fingers perfused  Skin:    General: Skin is warm and dry.  Neurological:     Mental Status: She is alert.  Psychiatric:        Mood and Affect: Mood normal.        Behavior: Behavior normal.     Assessment/Plan: Left wrist fx -- Plan outpatient eval by Dr. Yehuda Budd with likely surgery. Continue splint, NWB.    Jasmine Caldron, PA-C Orthopedic Surgery [phone] 12/29/2021, 11:22 AM

## 2022-01-02 DIAGNOSIS — E785 Hyperlipidemia, unspecified: Secondary | ICD-10-CM | POA: Diagnosis not present

## 2022-01-02 DIAGNOSIS — G319 Degenerative disease of nervous system, unspecified: Secondary | ICD-10-CM | POA: Diagnosis not present

## 2022-01-02 DIAGNOSIS — Z96641 Presence of right artificial hip joint: Secondary | ICD-10-CM | POA: Diagnosis not present

## 2022-01-02 DIAGNOSIS — S52592D Other fractures of lower end of left radius, subsequent encounter for closed fracture with routine healing: Secondary | ICD-10-CM | POA: Diagnosis not present

## 2022-01-02 DIAGNOSIS — S52612D Displaced fracture of left ulna styloid process, subsequent encounter for closed fracture with routine healing: Secondary | ICD-10-CM | POA: Diagnosis not present

## 2022-01-02 DIAGNOSIS — M47812 Spondylosis without myelopathy or radiculopathy, cervical region: Secondary | ICD-10-CM | POA: Diagnosis not present

## 2022-01-02 DIAGNOSIS — F32A Depression, unspecified: Secondary | ICD-10-CM | POA: Diagnosis not present

## 2022-01-02 DIAGNOSIS — S0181XD Laceration without foreign body of other part of head, subsequent encounter: Secondary | ICD-10-CM | POA: Diagnosis not present

## 2022-01-02 DIAGNOSIS — G9340 Encephalopathy, unspecified: Secondary | ICD-10-CM | POA: Diagnosis not present

## 2022-01-07 DIAGNOSIS — S52502A Unspecified fracture of the lower end of left radius, initial encounter for closed fracture: Secondary | ICD-10-CM | POA: Diagnosis not present

## 2022-01-07 DIAGNOSIS — M25532 Pain in left wrist: Secondary | ICD-10-CM | POA: Diagnosis not present

## 2022-01-07 DIAGNOSIS — G8918 Other acute postprocedural pain: Secondary | ICD-10-CM | POA: Diagnosis not present

## 2022-01-12 DIAGNOSIS — S52572A Other intraarticular fracture of lower end of left radius, initial encounter for closed fracture: Secondary | ICD-10-CM | POA: Diagnosis not present

## 2022-01-12 DIAGNOSIS — Y999 Unspecified external cause status: Secondary | ICD-10-CM | POA: Diagnosis not present

## 2022-01-12 DIAGNOSIS — X58XXXA Exposure to other specified factors, initial encounter: Secondary | ICD-10-CM | POA: Diagnosis not present

## 2022-01-19 DIAGNOSIS — Z6824 Body mass index (BMI) 24.0-24.9, adult: Secondary | ICD-10-CM | POA: Diagnosis not present

## 2022-01-19 DIAGNOSIS — I493 Ventricular premature depolarization: Secondary | ICD-10-CM | POA: Diagnosis not present

## 2022-01-19 DIAGNOSIS — R009 Unspecified abnormalities of heart beat: Secondary | ICD-10-CM | POA: Diagnosis not present

## 2022-01-19 DIAGNOSIS — M25532 Pain in left wrist: Secondary | ICD-10-CM | POA: Diagnosis not present

## 2022-01-21 DIAGNOSIS — F3341 Major depressive disorder, recurrent, in partial remission: Secondary | ICD-10-CM | POA: Diagnosis not present

## 2022-01-21 DIAGNOSIS — I10 Essential (primary) hypertension: Secondary | ICD-10-CM | POA: Diagnosis not present

## 2022-01-21 DIAGNOSIS — E782 Mixed hyperlipidemia: Secondary | ICD-10-CM | POA: Diagnosis not present

## 2022-01-25 DIAGNOSIS — G5602 Carpal tunnel syndrome, left upper limb: Secondary | ICD-10-CM | POA: Diagnosis not present

## 2022-01-25 DIAGNOSIS — S52502A Unspecified fracture of the lower end of left radius, initial encounter for closed fracture: Secondary | ICD-10-CM | POA: Diagnosis not present

## 2022-01-25 DIAGNOSIS — G5601 Carpal tunnel syndrome, right upper limb: Secondary | ICD-10-CM | POA: Diagnosis not present

## 2022-01-25 DIAGNOSIS — R6889 Other general symptoms and signs: Secondary | ICD-10-CM | POA: Diagnosis not present

## 2022-01-25 DIAGNOSIS — G8918 Other acute postprocedural pain: Secondary | ICD-10-CM | POA: Diagnosis not present

## 2022-01-27 DIAGNOSIS — S52502A Unspecified fracture of the lower end of left radius, initial encounter for closed fracture: Secondary | ICD-10-CM | POA: Diagnosis not present

## 2022-02-03 DIAGNOSIS — S52502A Unspecified fracture of the lower end of left radius, initial encounter for closed fracture: Secondary | ICD-10-CM | POA: Diagnosis not present

## 2022-02-03 DIAGNOSIS — G8918 Other acute postprocedural pain: Secondary | ICD-10-CM | POA: Diagnosis not present

## 2022-02-03 DIAGNOSIS — M25532 Pain in left wrist: Secondary | ICD-10-CM | POA: Diagnosis not present

## 2022-02-08 ENCOUNTER — Telehealth: Payer: Self-pay

## 2022-02-08 DIAGNOSIS — M25532 Pain in left wrist: Secondary | ICD-10-CM | POA: Diagnosis not present

## 2022-02-08 NOTE — Telephone Encounter (Signed)
Left a message for the pt to call back re: her appt tomorrow that I moved to Dr Kirke Corin for PVC's originally...but since he is seeing PV pts I moved her to a more appropriate MD..  Dr Wyline Mood at 11:40 am... she had advised me when I spoke with her earlier today that she sees an MD at [facility] and likes the [facility] location. She does not have a preference of who she sees but would like to see a female MD if possible but if not anyone is fine with her.   I Lm asking her to call back to confirm this appt time is okay with her.   I will send to the PCC's to help follow up with her again today.

## 2022-02-09 ENCOUNTER — Ambulatory Visit: Payer: Medicare HMO | Admitting: Internal Medicine

## 2022-02-09 ENCOUNTER — Encounter: Payer: Self-pay | Admitting: Internal Medicine

## 2022-02-09 ENCOUNTER — Ambulatory Visit: Payer: Medicare HMO | Admitting: Cardiovascular Disease

## 2022-02-09 ENCOUNTER — Ambulatory Visit: Payer: Medicare HMO | Attending: Internal Medicine | Admitting: Internal Medicine

## 2022-02-09 VITALS — BP 118/60 | HR 93 | Ht 64.0 in | Wt 143.4 lb

## 2022-02-09 DIAGNOSIS — R9431 Abnormal electrocardiogram [ECG] [EKG]: Secondary | ICD-10-CM

## 2022-02-09 NOTE — Progress Notes (Signed)
Cardiology Office Note:    Date:  02/09/2022   ID:  Jasmine Benson, DOB September 10, 1945, MRN [id number]  PCP:  Soundra Pilon, FNP   [facility] Providers Cardiologist:  Maisie Fus, MD     Referring MD: Soundra Pilon, FNP   No chief complaint on file. Occasional PVC  History of Present Illness:    Jasmine Benson is a 76 y.o. female with a hx of etoh, , ecg shows sinus rhythm 12/25/2021, referral for PVCs. She is asymptomatic. No prior cardiology visit. No prior echo. No cardiac cath. 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 She walks. No CP. No SOB. No smoking. Drinks red wine daily, amount varies. K is ok.   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; have some in my left knee" (10/05/2016)   HLD (hyperlipidemia)     Past Surgical History:  Procedure Laterality Date   APPENDECTOMY     AUGMENTATION MAMMAPLASTY Bilateral    CATARACT EXTRACTION W/ INTRAOCULAR LENS IMPLANT Right 07/13/2016   FRACTURE SURGERY     HIP ARTHROPLASTY Right 10/03/2016   Procedure: ARTHROPLASTY BIPOLAR HIP (HEMIARTHROPLASTY);  Surgeon: Teryl Lucy, MD;  Location: [facility] OR;  Service: Orthopedics;  Laterality: Right;   KNEE ARTHROSCOPY Left    "torn meniscus"   SYMPATHECTOMY Left 1966   "lumbar; opened me up"   TONSILLECTOMY     TUBAL LIGATION     VAGINAL HYSTERECTOMY      Current Medications: Current Meds  Medication Sig   Cholecalciferol (VITAMIN D3 PO) Take 1 capsule by mouth daily.   citalopram (CELEXA) 20 MG tablet Take 20 mg by mouth daily.   ezetimibe (ZETIA) 10 MG tablet Take 10 mg by mouth at bedtime.   FOLIC ACID PO Take 2 capsules by mouth daily. 400mg    methocarbamol (ROBAXIN) 500 MG tablet Take 1 tablet (500 mg total) by mouth every 8 (eight) hours as needed for muscle spasms.   montelukast (SINGULAIR) 10 MG tablet Take 10 mg by mouth daily.   rosuvastatin (CRESTOR) 5 MG tablet Take 5 mg by mouth 2 (two) times a week.  Monday and Friday   thiamine (VITAMIN B-1) 100 MG tablet Take 1 tablet (100 mg total) by mouth daily.   traZODone (DESYREL) 150 MG tablet Take 150 mg by mouth at bedtime.     Allergies:   Patient has no known allergies.   Social History   Socioeconomic History   Marital status: Divorced    Spouse name: Not on file   Number of children: Not on file   Years of education: Not on file   Highest education level: Not on file  Occupational History   Not on file  Tobacco Use   Smoking status: Never   Smokeless tobacco: Never  Vaping Use   Vaping Use: Never used  Substance and Sexual Activity   Alcohol use: Yes    Alcohol/week: 14.0 standard drinks of alcohol    Types: 14 Glasses of wine per week    Comment: 10/05/2016 "couple glasses of red wine/day"   Drug use: No   Sexual activity: Not Currently  Other Topics Concern   Not on file  Social History Narrative   Not on file   Social Determinants of Health   Financial Resource Strain: Not on file  Food Insecurity: Not on file  Transportation Needs: Not on file  Physical Activity: Not on file  Stress: Not on  file  Social Connections: Not on file     Family History: The patient's family history includes Hypertension in her father and mother. No family hx of CVD  ROS:   Please see the history of present illness.     All other systems reviewed and are negative.  EKGs/Labs/Other Studies Reviewed:    The following studies were reviewed today:   EKG:  EKG is  ordered today.  The ekg ordered today demonstrates   02/09/2022- NSR, occasional PVC  Recent Labs: 12/25/2021: ALT 21 12/27/2021: BUN 7; Creatinine, Ser 0.53; Hemoglobin 10.3; Magnesium 1.8; Platelets 204; Potassium 3.8; Sodium 133   Recent Lipid Panel No results found for: "CHOL", "TRIG", "HDL", "CHOLHDL", "VLDL", "LDLCALC", "LDLDIRECT"   Risk Assessment/Calculations:     Physical Exam:    VS:  Vitals:   02/09/22 1120  BP: 118/60  Pulse: 93  SpO2: 97%       BP 118/60   Pulse 93   Ht 5\' 4"  (1.626 m)   Wt 143 lb 6.4 oz (65 kg)   LMP  (LMP Unknown)   SpO2 97%   BMI 24.61 kg/m     Wt Readings from Last 3 Encounters:  02/09/22 143 lb 6.4 oz (65 kg)  12/29/21 150 lb 4.8 oz (68.2 kg)  05/20/21 147 lb 11.3 oz (67 kg)     GEN:  Well nourished, well developed in no acute distress HEENT: Normal NECK: No JVD; No carotid bruits LYMPHATICS: No lymphadenopathy CARDIAC: RRR, no murmurs, rubs, gallops RESPIRATORY:  Clear to auscultation without rales, wheezing or rhonchi  ABDOMEN: Soft, non-tender, non-distended MUSCULOSKELETAL: L wrist splint SKIN: Warm and dry NEUROLOGIC:  Alert and oriented x 3 PSYCHIATRIC:  Normal affect   ASSESSMENT:    PVCs: asymptomatic. Very minimal. Low concern for CAD.  Discussed weaning etoh and hydrating with electrolytes. Will obtain TTE PLAN:    In order of problems listed above:  TTE      Medication Adjustments/Labs and Tests Ordered: Current medicines are reviewed at length with the patient today.  Concerns regarding medicines are outlined above.  Orders Placed This Encounter  Procedures   EKG 12-Lead   ECHOCARDIOGRAM COMPLETE   No orders of the defined types were placed in this encounter.   Patient Instructions  Medication Instructions:  No Changes In Medications at this time.  *If you need a refill on your cardiac medications before your next appointment, please call your pharmacy*  Lab Work: None Ordered At This Time.  If you have labs (blood work) drawn today and your tests are completely normal, you will receive your results only by: MyChart Message (if you have MyChart) OR A paper copy in the mail If you have any lab test that is abnormal or we need to change your treatment, we will call you to review the results.  Testing/Procedures: Your physician has requested that you have an echocardiogram. Echocardiography is a painless test that uses sound waves to create images of your  heart. It provides your doctor with information about the size and shape of your heart and how well your heart's chambers and valves are working. You may receive an ultrasound enhancing agent through an IV if needed to better visualize your heart during the echo.This procedure takes approximately one hour. There are no restrictions for this procedure. This will take place at the [address].   Follow-Up: At [facility], you and your health needs are our priority.  As part of  our continuing mission to provide you with exceptional heart care, we have created designated Provider Care Teams.  These Care Teams include your primary Cardiologist (physician) and Advanced Practice Providers (APPs -  Physician Assistants and Nurse Practitioners) who all work together to provide you with the care you need, when you need it.  Your next appointment:   AS NEEDED   The format for your next appointment:   In Person  Provider:   Maisie Fus, MD             Signed, Maisie Fus, MD  02/09/2022 12:08 PM    [facility]

## 2022-02-09 NOTE — Patient Instructions (Addendum)
Medication Instructions:  No Changes In Medications at this time.  *If you need a refill on your cardiac medications before your next appointment, please call your pharmacy*  Lab Work: None Ordered At This Time.  If you have labs (blood work) drawn today and your tests are completely normal, you will receive your results only by: MyChart Message (if you have MyChart) OR A paper copy in the mail If you have any lab test that is abnormal or we need to change your treatment, we will call you to review the results.  Testing/Procedures: Your physician has requested that you have an echocardiogram. Echocardiography is a painless test that uses sound waves to create images of your heart. It provides your doctor with information about the size and shape of your heart and how well your heart's chambers and valves are working. You may receive an ultrasound enhancing agent through an IV if needed to better visualize your heart during the echo.This procedure takes approximately one hour. There are no restrictions for this procedure. This will take place at the [address].   Follow-Up: At [facility], you and your health needs are our priority.  As part of our continuing mission to provide you with exceptional heart care, we have created designated Provider Care Teams.  These Care Teams include your primary Cardiologist (physician) and Advanced Practice Providers (APPs -  Physician Assistants and Nurse Practitioners) who all work together to provide you with the care you need, when you need it.  Your next appointment:   AS NEEDED   The format for your next appointment:   In Person  Provider:   Branch, Mary E, MD       

## 2022-02-10 ENCOUNTER — Telehealth: Payer: Self-pay | Admitting: Internal Medicine

## 2022-02-10 NOTE — Telephone Encounter (Signed)
Patient would like to switch from Dr. Wyline Mood to Dr. Tenny Craw.  Please confirm.

## 2022-02-12 DIAGNOSIS — M25532 Pain in left wrist: Secondary | ICD-10-CM | POA: Diagnosis not present

## 2022-02-16 DIAGNOSIS — M25532 Pain in left wrist: Secondary | ICD-10-CM | POA: Diagnosis not present

## 2022-02-17 DIAGNOSIS — M25532 Pain in left wrist: Secondary | ICD-10-CM | POA: Diagnosis not present

## 2022-02-23 DIAGNOSIS — M25532 Pain in left wrist: Secondary | ICD-10-CM | POA: Diagnosis not present

## 2022-02-24 DIAGNOSIS — S52502A Unspecified fracture of the lower end of left radius, initial encounter for closed fracture: Secondary | ICD-10-CM | POA: Diagnosis not present

## 2022-02-24 DIAGNOSIS — M25532 Pain in left wrist: Secondary | ICD-10-CM | POA: Diagnosis not present

## 2022-03-04 DIAGNOSIS — M25532 Pain in left wrist: Secondary | ICD-10-CM | POA: Diagnosis not present

## 2022-03-09 DIAGNOSIS — M25532 Pain in left wrist: Secondary | ICD-10-CM | POA: Diagnosis not present

## 2022-03-11 DIAGNOSIS — M25532 Pain in left wrist: Secondary | ICD-10-CM | POA: Diagnosis not present

## 2022-03-14 NOTE — Progress Notes (Signed)
Cardiology Office Note   Date:  03/15/2022   ID:  Rachelle, Edwards Oct 01, 1945, MRN [id number]  PCP:  Kristen Loader, FNP  Cardiologist:   Dorris Carnes, MD   Patient presents for follow up of PVCs and mitral regurgitation.   History of Present Illness: Jasmine Benson is a 77 y.o. female who was referred to cardiology for PVCs   She was seen initially by Beckie Busing in Dec 2023   denied CP  no SOB    The pt denies palpitations.   Breathing is OK  She denies CP   No dizziness  Recovering for wrist surgery  The pt just had an echo today that showed normal LV and RV systolic function   Moderate MR       Current Meds  Medication Sig   Cholecalciferol (VITAMIN D3 PO) Take 1 capsule by mouth daily.   citalopram (CELEXA) 20 MG tablet Take 20 mg by mouth daily.   FOLIC ACID PO Take 2 capsules by mouth daily. 400mg    montelukast (SINGULAIR) 10 MG tablet Take 10 mg by mouth daily.   thiamine (VITAMIN B-1) 100 MG tablet Take 1 tablet (100 mg total) by mouth daily.   traZODone (DESYREL) 150 MG tablet Take 150 mg by mouth at bedtime.   [DISCONTINUED] ezetimibe (ZETIA) 10 MG tablet Take 10 mg by mouth at bedtime.   [DISCONTINUED] rosuvastatin (CRESTOR) 5 MG tablet Take 5 mg by mouth 2 (two) times a week. TAKE 1 TABLET (5 MG) BY MOUTH 7 DAYS A WEEK.     Allergies:   Patient has no known allergies.   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; have some in my left knee" (10/05/2016)   HLD (hyperlipidemia)     Past Surgical History:  Procedure Laterality Date   APPENDECTOMY     AUGMENTATION MAMMAPLASTY Bilateral    CATARACT EXTRACTION W/ INTRAOCULAR LENS IMPLANT Right 07/13/2016   FRACTURE SURGERY     HIP ARTHROPLASTY Right 10/03/2016   Procedure: ARTHROPLASTY BIPOLAR HIP (HEMIARTHROPLASTY);  Surgeon: Marchia Bond, MD;  Location: [facility];  Service: Orthopedics;  Laterality: Right;   KNEE ARTHROSCOPY Left    "torn meniscus"   SYMPATHECTOMY  Left 1966   "lumbar; opened me up"   TONSILLECTOMY     TUBAL LIGATION     VAGINAL HYSTERECTOMY       Social History:  The patient  reports that she has never smoked. She has never used smokeless tobacco. She reports current alcohol use of about 14.0 standard drinks of alcohol per week. She reports that she does not use drugs.   Family History:  The patient's family history includes Hypertension in her father and mother.    ROS:  Please see the history of present illness. All other systems are reviewed and  Negative to the above problem except as noted.    PHYSICAL EXAM: VS:  BP 118/70   Pulse (!) 102   Ht 5\' 4"  (1.626 m)   Wt 148 lb 9.6 oz (67.4 kg)   LMP  (LMP Unknown)   SpO2 94%   BMI 25.51 kg/m   GEN: Well nourished, well developed, in no acute distress  HEENT: normal  Neck: no JVD, no carotid bruits Cardiac: RRR; Gr II / VI systolic murmur at apex   No LE edema  Respiratory:  clear to auscultation bilaterally GI: soft, nontender, nondistended, + BS  No hepatomegaly  MS: no  deformity Moving all extremities    L wrist in brace  Skin: warm and dry, no rash Neuro:  Strength and sensation are intact Psych: euthymic mood, full affect   EKG:  EKG is not ordered today.   Lipid Panel No results found for: "CHOL", "TRIG", "HDL", "CHOLHDL", "VLDL", "LDLCALC", "LDLDIRECT"    Wt Readings from Last 3 Encounters:  03/15/22 148 lb 9.6 oz (67.4 kg)  02/09/22 143 lb 6.4 oz (65 kg)  12/29/21 150 lb 4.8 oz (68.2 kg)      ASSESSMENT AND PLAN:  1  PVCs   Pt with asympomtatic PVCs   Echo normal   I would set up for Zio patch to evaluate burden   Follow for now   2  Mitral regurgitation.  Moderate on echo   will set up for echo net winter   3 atherosclerosis   Pt with mild atherosclerosis on CT    will need to control risk factors  4   HL   Pt on Zetia  takes at night and Crestor (2x per week)   I would recomm Crestor 7 days per week  Stop Zetia    Follow up lipomed in 8 wks   with liver panel  Follow up in Fall 2024   Current medicines are reviewed at length with the patient today.  The patient does not have concerns regarding medicines.  Signed, Dorris Carnes, MD  03/15/2022 8:16 PM    [facility] [place], [place], [place]  [address] Phone: [phone]; Fax: [phone]

## 2022-03-15 ENCOUNTER — Encounter: Payer: Self-pay | Admitting: Internal Medicine

## 2022-03-15 ENCOUNTER — Ambulatory Visit: Payer: Medicare HMO | Attending: Internal Medicine | Admitting: Internal Medicine

## 2022-03-15 ENCOUNTER — Ambulatory Visit (HOSPITAL_COMMUNITY): Payer: Medicare HMO | Attending: Cardiovascular Disease

## 2022-03-15 ENCOUNTER — Ambulatory Visit (INDEPENDENT_AMBULATORY_CARE_PROVIDER_SITE_OTHER): Payer: Medicare HMO

## 2022-03-15 VITALS — BP 118/70 | HR 102 | Ht 64.0 in | Wt 148.6 lb

## 2022-03-15 DIAGNOSIS — I340 Nonrheumatic mitral (valve) insufficiency: Secondary | ICD-10-CM | POA: Diagnosis not present

## 2022-03-15 DIAGNOSIS — R9431 Abnormal electrocardiogram [ECG] [EKG]: Secondary | ICD-10-CM | POA: Diagnosis not present

## 2022-03-15 DIAGNOSIS — E7849 Other hyperlipidemia: Secondary | ICD-10-CM | POA: Diagnosis not present

## 2022-03-15 DIAGNOSIS — I493 Ventricular premature depolarization: Secondary | ICD-10-CM

## 2022-03-15 DIAGNOSIS — Z79899 Other long term (current) drug therapy: Secondary | ICD-10-CM

## 2022-03-15 LAB — ECHOCARDIOGRAM COMPLETE
Area-P 1/2: 6.43 cm2
MV M vel: 4.66 m/s
MV Peak grad: 86.9 mmHg
Radius: 0.6 cm
S' Lateral: 3.6 cm

## 2022-03-15 MED ORDER — ROSUVASTATIN CALCIUM 5 MG PO TABS: ORAL_TABLET | ORAL | 3 refills | Status: DC

## 2022-03-15 NOTE — Progress Notes (Unsigned)
Enrolled for [employer] to mail a ZIO XT long term holter monitor to the patients address on file.  

## 2022-03-15 NOTE — Patient Instructions (Signed)
Medication Instructions:  Stop ZETIA  TAKE CRESTOR 7 DAYS A WEEK  *If you need a refill on your cardiac medications before your next appointment, please call your pharmacy*   Lab Work: [place] 2 MONTHS  If you have labs (blood work) drawn today and your tests are completely normal, you will receive your results only by: [place] (if you have MyChart) OR A paper copy in the mail If you have any lab test that is abnormal or we need to change your treatment, we will call you to review the results.   Testing/Procedures: OCT/ NOV  Your physician has requested that you have an echocardiogram. Echocardiography is a painless test that uses sound waves to create images of your heart. It provides your doctor with information about the size and shape of your heart and how well your heart's chambers and valves are working. This procedure takes approximately one hour. There are no restrictions for this procedure. Please do NOT wear cologne, perfume, aftershave, or lotions (deodorant is allowed). Please arrive 15 minutes prior to your appointment time  ZIO XT- Long Term Monitor Instructions  Your physician has requested you wear a ZIO patch monitor for 3 days.  This is a single patch monitor. [employer] supplies one patch monitor per enrollment. Additional stickers are not available. Please do not apply patch if you will be having a Nuclear Stress Test,  Echocardiogram, Cardiac CT, MRI, or Chest Xray during the period you would be wearing the  monitor. The patch cannot be worn during these tests. You cannot remove and re-apply the  ZIO XT patch monitor.  Your ZIO patch monitor will be mailed 3 day USPS to your address on file. It may take 3-5 days  to receive your monitor after you have been enrolled.  Once you have received your monitor, please review the enclosed instructions. Your monitor  has already been registered assigning a specific monitor serial # to you.  Billing and  Patient Assistance Program Information  We have supplied [employer] with any of your insurance information on file for billing purposes. [employer] offers a sliding scale Patient Assistance Program for patients that do not have  insurance, or whose insurance does not completely cover the cost of the ZIO monitor.  You must apply for the Patient Assistance Program to qualify for this discounted rate.  To apply, please call [employer] at [phone], select option 4, select option 2, ask to apply for  Patient Assistance Program. Theodore Demark will ask your household income, and how many people  are in your household. They will quote your out-of-pocket cost based on that information.  [employer] will also be able to set up a 50-month, interest-free payment plan if needed.  Applying the monitor   Shave hair from upper left chest.  Hold abrader disc by orange tab. Rub abrader in 40 strokes over the upper left chest as  indicated in your monitor instructions.  Clean area with 4 enclosed alcohol pads. Let dry.  Apply patch as indicated in monitor instructions. Patch will be placed under collarbone on left  side of chest with arrow pointing upward.  Rub patch adhesive wings for 2 minutes. Remove white label marked "1". Remove the white  label marked "2". Rub patch adhesive wings for 2 additional minutes.  While looking in a mirror, press and release button in center of patch. A small green light will  flash 3-4 times. This will be your only indicator that the monitor has been turned  on.  Do not shower for the first 24 hours. You may shower after the first 24 hours.  Press the button if you feel a symptom. You will hear a small click. Record Date, Time and  Symptom in the Patient Logbook.  When you are ready to remove the patch, follow instructions on the last 2 pages of Patient  Logbook. Stick patch monitor onto the last page of Patient Logbook.  Place Patient Logbook in the blue and white box. Use locking tab on  box and tape box closed  securely. The blue and white box has prepaid postage on it. Please place it in the mailbox as  soon as possible. Your physician should have your test results approximately 7 days after the  monitor has been mailed back to [facility].  Call [facility] at [phone] if you have questions regarding  your ZIO XT patch monitor. Call them immediately if you see an orange light blinking on your  monitor.  If your monitor falls off in less than 4 days, contact our Monitor department at [phone].  If your monitor becomes loose or falls off after 4 days call [employer] at [phone] for  suggestions on securing your monitor    Follow-Up: At [facility], you and your health needs are our priority.  As part of our continuing mission to provide you with exceptional heart care, we have created designated Provider Care Teams.  These Care Teams include your primary Cardiologist (physician) and Advanced Practice Providers (APPs -  Physician Assistants and Nurse Practitioners) who all work together to provide you with the care you need, when you need it.  We recommend signing up for the patient portal called "MyChart".  Sign up information is provided on this After Visit Summary.  MyChart is used to connect with patients for Virtual Visits (Telemedicine).  Patients are able to view lab/test results, encounter notes, upcoming appointments, etc.  Non-urgent messages can be sent to your provider as well.   To learn more about what you can do with MyChart, go to [email or web address].    Your next appointment:  AFTER ECHO ON OCT OR NOV

## 2022-03-16 ENCOUNTER — Telehealth: Payer: Self-pay | Admitting: Internal Medicine

## 2022-03-16 ENCOUNTER — Telehealth: Payer: Self-pay

## 2022-03-16 NOTE — Telephone Encounter (Signed)
Patient returned RN's call regarding results.

## 2022-03-16 NOTE — Telephone Encounter (Addendum)
LM fore the pt to call back... she needs NMR and hepatic panel drawn in 2 months from her OV yesterday.   Lab orders have been placed.

## 2022-03-16 NOTE — Telephone Encounter (Signed)
Spoke with patient regarding the following results. Patient made aware and patient verbalized understanding.

## 2022-03-17 DIAGNOSIS — M25532 Pain in left wrist: Secondary | ICD-10-CM | POA: Diagnosis not present

## 2022-03-18 DIAGNOSIS — I340 Nonrheumatic mitral (valve) insufficiency: Secondary | ICD-10-CM

## 2022-03-18 DIAGNOSIS — I493 Ventricular premature depolarization: Secondary | ICD-10-CM

## 2022-03-22 DIAGNOSIS — M25532 Pain in left wrist: Secondary | ICD-10-CM | POA: Diagnosis not present

## 2022-03-25 DIAGNOSIS — S52502D Unspecified fracture of the lower end of left radius, subsequent encounter for closed fracture with routine healing: Secondary | ICD-10-CM | POA: Diagnosis not present

## 2022-03-25 DIAGNOSIS — M25532 Pain in left wrist: Secondary | ICD-10-CM | POA: Diagnosis not present

## 2022-03-25 DIAGNOSIS — I340 Nonrheumatic mitral (valve) insufficiency: Secondary | ICD-10-CM | POA: Diagnosis not present

## 2022-03-25 DIAGNOSIS — I493 Ventricular premature depolarization: Secondary | ICD-10-CM | POA: Diagnosis not present

## 2022-03-27 DIAGNOSIS — Z472 Encounter for removal of internal fixation device: Secondary | ICD-10-CM | POA: Diagnosis not present

## 2022-03-31 DIAGNOSIS — M25532 Pain in left wrist: Secondary | ICD-10-CM | POA: Diagnosis not present

## 2022-04-06 DIAGNOSIS — M25532 Pain in left wrist: Secondary | ICD-10-CM | POA: Diagnosis not present

## 2022-04-08 DIAGNOSIS — M25532 Pain in left wrist: Secondary | ICD-10-CM | POA: Diagnosis not present

## 2022-04-16 DIAGNOSIS — Z472 Encounter for removal of internal fixation device: Secondary | ICD-10-CM | POA: Diagnosis not present

## 2022-04-16 DIAGNOSIS — G8918 Other acute postprocedural pain: Secondary | ICD-10-CM | POA: Diagnosis not present

## 2022-04-21 DIAGNOSIS — M25532 Pain in left wrist: Secondary | ICD-10-CM | POA: Diagnosis not present

## 2022-04-26 DIAGNOSIS — M25532 Pain in left wrist: Secondary | ICD-10-CM | POA: Diagnosis not present

## 2022-04-28 DIAGNOSIS — M25532 Pain in left wrist: Secondary | ICD-10-CM | POA: Diagnosis not present

## 2022-04-29 DIAGNOSIS — S52502D Unspecified fracture of the lower end of left radius, subsequent encounter for closed fracture with routine healing: Secondary | ICD-10-CM | POA: Diagnosis not present

## 2022-05-03 DIAGNOSIS — M25532 Pain in left wrist: Secondary | ICD-10-CM | POA: Diagnosis not present

## 2022-05-10 DIAGNOSIS — M25532 Pain in left wrist: Secondary | ICD-10-CM | POA: Diagnosis not present

## 2022-05-13 DIAGNOSIS — M25532 Pain in left wrist: Secondary | ICD-10-CM | POA: Diagnosis not present

## 2022-05-17 DIAGNOSIS — M25532 Pain in left wrist: Secondary | ICD-10-CM | POA: Diagnosis not present

## 2022-05-19 ENCOUNTER — Ambulatory Visit: Payer: Medicare HMO

## 2022-05-20 DIAGNOSIS — M25532 Pain in left wrist: Secondary | ICD-10-CM | POA: Diagnosis not present

## 2022-05-25 ENCOUNTER — Ambulatory Visit: Payer: Medicare HMO | Attending: Internal Medicine

## 2022-05-25 DIAGNOSIS — I340 Nonrheumatic mitral (valve) insufficiency: Secondary | ICD-10-CM | POA: Diagnosis not present

## 2022-05-25 DIAGNOSIS — Z79899 Other long term (current) drug therapy: Secondary | ICD-10-CM

## 2022-05-25 DIAGNOSIS — E7849 Other hyperlipidemia: Secondary | ICD-10-CM

## 2022-05-25 DIAGNOSIS — I493 Ventricular premature depolarization: Secondary | ICD-10-CM | POA: Diagnosis not present

## 2022-05-26 LAB — NMR, LIPOPROFILE
Cholesterol, Total: 252 mg/dL — ABNORMAL HIGH (ref 100–199)
HDL Particle Number: 51.4 umol/L (ref >=30.5)
HDL-C: 139 mg/dL (ref >39)
LDL Particle Number: 717 nmol/L (ref <1000)
LDL Size: 21.1 nm (ref >20.5)
LDL-C (NIH Calc): 102 mg/dL — ABNORMAL HIGH (ref 0–99)
LP-IR Score: <25 (ref <=45)
Small LDL Particle Number: <90 nmol/L (ref <=527)
Triglycerides: 67 mg/dL (ref 0–149)

## 2022-05-26 LAB — HEPATIC FUNCTION PANEL
ALT: 17 IU/L (ref 0–32)
AST: 27 IU/L (ref 0–40)
Albumin: 4.4 g/dL (ref 3.8–4.8)
Alkaline Phosphatase: 89 IU/L (ref 44–121)
Bilirubin Total: 0.4 mg/dL (ref 0.0–1.2)
Bilirubin, Direct: 0.13 mg/dL (ref 0.00–0.40)
Total Protein: 7 g/dL (ref 6.0–8.5)

## 2022-06-03 DIAGNOSIS — M25532 Pain in left wrist: Secondary | ICD-10-CM | POA: Diagnosis not present

## 2022-06-10 DIAGNOSIS — S52502A Unspecified fracture of the lower end of left radius, initial encounter for closed fracture: Secondary | ICD-10-CM | POA: Diagnosis not present

## 2022-06-10 DIAGNOSIS — M25532 Pain in left wrist: Secondary | ICD-10-CM | POA: Diagnosis not present

## 2022-06-18 DIAGNOSIS — Z23 Encounter for immunization: Secondary | ICD-10-CM | POA: Diagnosis not present

## 2022-06-18 DIAGNOSIS — Z9989 Dependence on other enabling machines and devices: Secondary | ICD-10-CM | POA: Diagnosis not present

## 2022-06-18 DIAGNOSIS — Z6827 Body mass index (BMI) 27.0-27.9, adult: Secondary | ICD-10-CM | POA: Diagnosis not present

## 2022-06-18 DIAGNOSIS — E538 Deficiency of other specified B group vitamins: Secondary | ICD-10-CM | POA: Diagnosis not present

## 2022-06-18 DIAGNOSIS — Z0000 Encounter for general adult medical examination without abnormal findings: Secondary | ICD-10-CM | POA: Diagnosis not present

## 2022-06-18 DIAGNOSIS — E559 Vitamin D deficiency, unspecified: Secondary | ICD-10-CM | POA: Diagnosis not present

## 2022-06-23 DIAGNOSIS — M25532 Pain in left wrist: Secondary | ICD-10-CM | POA: Diagnosis not present

## 2022-06-30 DIAGNOSIS — M25532 Pain in left wrist: Secondary | ICD-10-CM | POA: Diagnosis not present

## 2022-07-07 DIAGNOSIS — M25532 Pain in left wrist: Secondary | ICD-10-CM | POA: Diagnosis not present

## 2022-07-14 DIAGNOSIS — M25532 Pain in left wrist: Secondary | ICD-10-CM | POA: Diagnosis not present

## 2022-12-14 ENCOUNTER — Telehealth: Payer: Self-pay | Admitting: Internal Medicine

## 2022-12-14 NOTE — Telephone Encounter (Signed)
Pt declined any testing at this time.echocardiogram and labs due to being unable to afford the billing... I made her a follow up appt with Dr Tenny Craw to be sure we at least follow up with her and I will message billing to see if they can help.. maybe the cost will not be as much as she thinks it may be.

## 2022-12-14 NOTE — Telephone Encounter (Signed)
Patient called and canceled her Echocardiogram test stating she could not afford to pay for this test.

## 2022-12-14 NOTE — Telephone Encounter (Signed)
Bronson Ing, RN I provided an estimate for this patient.  Per our software, she would only owe about $10.  I left her a voice mail.  Rhonda   LM for the pt to call back.

## 2022-12-15 ENCOUNTER — Telehealth: Payer: Self-pay | Admitting: Internal Medicine

## 2022-12-15 NOTE — Telephone Encounter (Signed)
  Pt is returning Erskine Squibb call

## 2022-12-15 NOTE — Telephone Encounter (Signed)
Patient was returning call. Please advise ?

## 2022-12-15 NOTE — Telephone Encounter (Signed)
Pt advised and she will wait until she talks to [employer] and then let me know when I can get her Echo rescheduled.

## 2022-12-16 NOTE — Telephone Encounter (Signed)
Attempted to call patient back to relay, that per our estimate, she would only owe a $10 copay for an echo.  Unsure where she obtained the information regarding $300 would be her responsibility.  Unable to leave a voice mail as the call dropped.  Please relay this information to her should she call back.

## 2022-12-21 ENCOUNTER — Other Ambulatory Visit (HOSPITAL_COMMUNITY): Payer: Medicare HMO

## 2023-01-29 NOTE — Progress Notes (Unsigned)
Cardiology Office Note   Date:  01/31/2023   ID:  Jasmine, Benson 04/27/1945, MRN [id number]  PCP:  Soundra Pilon, FNP  Cardiologist:   Dietrich Pates, MD   Patient presents for follow up of PVCs and mitral regurgitation.   History of Present Illness: Jasmine Benson is a 77 y.o. female who was referred to cardiology for PVCs   She was seen initially by Tereso Newcomer in Dec 2023   denied CP  no SOB    The pt denies palpitations.   Breathing is OK  She denies CP   No dizziness  Recovering for wrist surgery  The pt just had an echo today that showed normal LV and RV systolic function   Moderate MR    I saw the pt inJan 2024  Pt says her breathing is OK  She does not have CP   Denies palpitations  Admits to needing to drink more water   Has had 1 bottle today    Current Meds  Medication Sig   Cholecalciferol (VITAMIN D3 PO) Take 1 capsule by mouth daily.   citalopram (CELEXA) 20 MG tablet Take 20 mg by mouth daily.   FOLIC ACID PO Take 2 capsules by mouth daily. 400mg    rosuvastatin (CRESTOR) 5 MG tablet TAKE 1 TABLET (5 MG) BY MOUTH 7 DAYS A WEEK.   thiamine (VITAMIN B-1) 100 MG tablet Take 1 tablet (100 mg total) by mouth daily.   traZODone (DESYREL) 150 MG tablet Take 150 mg by mouth at bedtime.     Allergies:   Patient has no known allergies.   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; have some in my left knee" (10/05/2016)   HLD (hyperlipidemia)     Past Surgical History:  Procedure Laterality Date   APPENDECTOMY     AUGMENTATION MAMMAPLASTY Bilateral    CATARACT EXTRACTION W/ INTRAOCULAR LENS IMPLANT Right 07/13/2016   FRACTURE SURGERY     HIP ARTHROPLASTY Right 10/03/2016   Procedure: ARTHROPLASTY BIPOLAR HIP (HEMIARTHROPLASTY);  Surgeon: Teryl Lucy, MD;  Location: [facility] OR;  Service: Orthopedics;  Laterality: Right;   KNEE ARTHROSCOPY Left    "torn meniscus"   SYMPATHECTOMY Left 1966   "lumbar; opened me up"    TONSILLECTOMY     TUBAL LIGATION     VAGINAL HYSTERECTOMY       Social History:  The patient  reports that she has never smoked. She has never used smokeless tobacco. She reports current alcohol use of about 14.0 standard drinks of alcohol per week. She reports that she does not use drugs.   Family History:  The patient's family history includes Hypertension in her father and mother.    ROS:  Please see the history of present illness. All other systems are reviewed and  Negative to the above problem except as noted.    PHYSICAL EXAM: VS:  BP 102/60   Pulse (!) 113   Ht 5\' 4"  (1.626 m)   Wt 152 lb (68.9 kg)   LMP  (LMP Unknown)   SpO2 93%   BMI 26.09 kg/m   GEN: Well nourished, well developed, in no acute distress  HEENT: normal  Neck: JVP is normal, no carotid bruits Cardiac: RRR; Gr II / VI systolic murmur apex   No LE edema  Respiratory:  clear to auscultation bilaterally GI: soft, nontender   No hepatomegaly    EKG:  EKG Sinus tachycardia  116 bpm    Monitor FEb 2024   SR with rare PAC, PVC   Couple short bursts of SVT   Echo  Jan 2024  1. Left ventricular ejection fraction, by estimation, is 55 to 60%. The  left ventricle has normal function. The left ventricle has no regional  wall motion abnormalities. Indeterminate diastolic filling due to E-A  fusion.   2. Right ventricular systolic function is normal. The right ventricular  size is normal. There is normal pulmonary artery systolic pressure. The  estimated right ventricular systolic pressure is 29.4 mmHg.   3. The mitral valve is degenerative. Moderate mitral valve regurgitation.  No evidence of mitral stenosis. There is moderate holosystolic prolapse of  the medial scallop of the posterior leaflet of the mitral valve.   4. The aortic valve is tricuspid. Aortic valve regurgitation is not  visualized. No aortic stenosis is present.   5. The inferior vena cava is normal in size with greater than 50%   respiratory variability, suggesting right atrial pressure of 3 mmHg.  Lipid Panel No results found for: "CHOL", "TRIG", "HDL", "CHOLHDL", "VLDL", "LDLCALC", "LDLDIRECT"    Wt Readings from Last 3 Encounters:  01/31/23 152 lb (68.9 kg)  03/15/22 148 lb 9.6 oz (67.4 kg)  02/09/22 143 lb 6.4 oz (65 kg)      ASSESSMENT AND PLAN:  1  Mitral regurgitation   Moderate on echo in Jan 2024  Will repeat in July with follow up after   2  Hx of PVCs    Zio patch showed rare PVC  Follow   3  Sinus tachycardia    HR a little fast here   She is not dizzy   Says she is drinking some   Will check CBC, BMET nad TSH   Keep hydrated    4 Atherosclerosis   Last lipids in April 2024  LDL 102  HDL 139   She had few small particle LDL   Mostly large   particl number low at 717    Would keep on low dose Crestor 5 mg   5     HL   Pt on Zetia  takes at night and Crestor (2x per week)   I would recomm Crestor 7 days per week  Stop Zetia    Follow up lipomed in 8 wks  with liver panel  Follow up in Aug 2025 after echo   Current medicines are reviewed at length with the patient today.  The patient does not have concerns regarding medicines.  Signed, Dietrich Pates, MD  01/31/2023 2:18 PM    [facility] [address] [place], [place], [place]  [address] Phone: [phone]; Fax: [phone]

## 2023-01-31 ENCOUNTER — Encounter: Payer: Self-pay | Admitting: Internal Medicine

## 2023-01-31 ENCOUNTER — Ambulatory Visit: Payer: Medicare HMO | Attending: Internal Medicine | Admitting: Internal Medicine

## 2023-01-31 VITALS — BP 102/60 | HR 113 | Ht 64.0 in | Wt 152.0 lb

## 2023-01-31 DIAGNOSIS — Z09 Encounter for follow-up examination after completed treatment for conditions other than malignant neoplasm: Secondary | ICD-10-CM | POA: Diagnosis not present

## 2023-01-31 DIAGNOSIS — I340 Nonrheumatic mitral (valve) insufficiency: Secondary | ICD-10-CM | POA: Diagnosis not present

## 2023-01-31 NOTE — Patient Instructions (Signed)
Medication Instructions:  Your physician recommends that you continue on your current medications as directed. Please refer to the Current Medication list given to you today.  *If you need a refill on your cardiac medications before your next appointment, please call your pharmacy*   Lab Work: None.  If you have labs (blood work) drawn today and your tests are completely normal, you will receive your results only by: MyChart Message (if you have MyChart) OR A paper copy in the mail If you have any lab test that is abnormal or we need to change your treatment, we will call you to review the results.   Testing/Procedures: Your physician has requested that you have an echocardiogram July 2025. Echocardiography is a painless test that uses sound waves to create images of your heart. It provides your doctor with information about the size and shape of your heart and how well your heart's chambers and valves are working. This procedure takes approximately one hour. There are no restrictions for this procedure. Please do NOT wear cologne, perfume, aftershave, or lotions (deodorant is allowed). Please arrive 15 minutes prior to your appointment time.  Please note: We ask at that you not bring children with you during ultrasound (echo/ vascular) testing. Due to room size and safety concerns, children are not allowed in the ultrasound rooms during exams. Our front office staff cannot provide observation of children in our lobby area while testing is being conducted. An adult accompanying a patient to their appointment will only be allowed in the ultrasound room at the discretion of the ultrasound technician under special circumstances. We apologize for any inconvenience.    Follow-Up: At [facility], you and your health needs are our priority.  As part of our continuing mission to provide you with exceptional heart care, we have created designated Provider Care Teams.  These Care Teams  include your primary Cardiologist (physician) and Advanced Practice Providers (APPs -  Physician Assistants and Nurse Practitioners) who all work together to provide you with the care you need, when you need it.  We recommend signing up for the patient portal called "MyChart".  Sign up information is provided on this After Visit Summary.  MyChart is used to connect with patients for Virtual Visits (Telemedicine).  Patients are able to view lab/test results, encounter notes, upcoming appointments, etc.  Non-urgent messages can be sent to your provider as well.   To learn more about what you can do with MyChart, go to [email or web address].    Your next appointment:   9 month(s)  Provider:   Dr. Dietrich Pates, MD

## 2023-03-28 ENCOUNTER — Other Ambulatory Visit (HOSPITAL_COMMUNITY): Payer: Self-pay

## 2023-03-28 MED ORDER — CITALOPRAM HYDROBROMIDE 20 MG PO TABS: 20.0000 mg | ORAL_TABLET | Freq: Every day | ORAL | 3 refills | Status: AC

## 2023-04-04 ENCOUNTER — Other Ambulatory Visit: Payer: Self-pay | Admitting: Internal Medicine

## 2023-07-13 ENCOUNTER — Other Ambulatory Visit (HOSPITAL_COMMUNITY): Payer: Self-pay

## 2023-09-01 ENCOUNTER — Telehealth (HOSPITAL_COMMUNITY): Payer: Self-pay | Admitting: Internal Medicine

## 2023-09-01 NOTE — Telephone Encounter (Signed)
 Patient cancelled echocardiogram by automated system. I called to verify that she wanted to cancel and she confirmed. She will call us  back to reschedule. Order will be removed from the active echo WQ. When pt calls back we will reinstate the order. Thank you.

## 2023-09-05 ENCOUNTER — Ambulatory Visit (HOSPITAL_COMMUNITY): Payer: Medicare HMO

## 2023-11-10 ENCOUNTER — Encounter: Payer: Self-pay | Admitting: Internal Medicine

## 2024-01-25 ENCOUNTER — Ambulatory Visit (HOSPITAL_COMMUNITY)
Admission: RE | Admit: 2024-01-25 | Discharge: 2024-01-25 | Disposition: A | Payer: Self-pay | Source: Ambulatory Visit | Attending: Internal Medicine | Admitting: Internal Medicine

## 2024-01-25 DIAGNOSIS — I340 Nonrheumatic mitral (valve) insufficiency: Secondary | ICD-10-CM | POA: Diagnosis present

## 2024-01-25 LAB — ECHOCARDIOGRAM COMPLETE
Area-P 1/2: 5.58 cm2
MV M vel: 5.63 m/s
MV Peak grad: 126.8 mmHg
Radius: 0.7 cm
S' Lateral: 4 cm

## 2024-01-29 ENCOUNTER — Ambulatory Visit: Payer: Self-pay | Admitting: Internal Medicine

## 2024-02-05 NOTE — Progress Notes (Unsigned)
 Cardiology Office Note   Date:  02/06/2024   ID:  Jasmine Benson 03/11/1945, MRN [id number]  PCP:  Jasmine Artist PARAS, MD  Cardiologist:   Jasmine Gull, MD   Patient presents for follow up of Benson and mitral regurgitation.   History of Present Illness: Jasmine Benson is a 78 y.o. female who was referred to cardiology for Benson   She was seen initially by Jasmine Benson in Dec 2023    Jan 2024  Echo showed  normal LV and RV systolic function   Moderate MR   I saw the pt in Dec 2024   Feb 2024  Monitor showed SR   62 to 135 bpm   Average HR 98 bpm   Rare PAC, PVC    2 short bursts of SVT  Dec 2025  Echo showed LVEF 55 to 60% MVP  Mod MR     Pt says her breathing is ok  She is busy   Active    No CP     No Dizziness   No palpitations  Says she needs to drink more water  Works in community education officer   Current Meds  Medication Sig   Cholecalciferol  (VITAMIN D3 PO) Take 1 capsule by mouth daily.   citalopram  (CELEXA ) 20 MG tablet Take 20 mg by mouth daily.   citalopram  (CELEXA ) 20 MG tablet Take 1 tablet (20 mg total) by mouth daily.   FOLIC ACID  PO Take 2 capsules by mouth daily. 400mg    rosuvastatin  (CRESTOR ) 5 MG tablet TAKE 1 TABLET (5 MG) BY MOUTH 7 DAYS A WEEK.   thiamine  (VITAMIN B-1) 100 MG tablet Take 1 tablet (100 mg total) by mouth daily.   traZODone  (DESYREL ) 150 MG tablet Take 150 mg by mouth at bedtime.     Allergies:   Patient has no known allergies.   Past Medical History:  Diagnosis Date   Arthritis    probably in my hands; have some in my left knee (10/05/2016)   HLD (hyperlipidemia)     Past Surgical History:  Procedure Laterality Date   APPENDECTOMY     AUGMENTATION MAMMAPLASTY Bilateral    CATARACT EXTRACTION W/ INTRAOCULAR LENS IMPLANT Right 07/13/2016   FRACTURE SURGERY     HIP ARTHROPLASTY Right 10/03/2016   Procedure: ARTHROPLASTY BIPOLAR HIP (HEMIARTHROPLASTY);  Surgeon: Jasmine Chew, MD;  Location: [facility] OR;  Service: Orthopedics;  Laterality:  Right;   KNEE ARTHROSCOPY Left    torn meniscus   SYMPATHECTOMY Left 1966   lumbar; opened me up   TONSILLECTOMY     TUBAL LIGATION     VAGINAL HYSTERECTOMY       Social History:  The patient  reports that she has never smoked. She has never used smokeless tobacco. She reports current alcohol use of about 14.0 standard drinks of alcohol per week. She reports that she does not use drugs.   Family History:  The patient's family history includes Hypertension in her father and mother.    ROS:  Please see the history of present illness. All other systems are reviewed and  Negative to the above problem except as noted.    PHYSICAL EXAM: VS:  BP 98/60 (BP Location: Left Arm, Patient Position: Sitting, Cuff Size: Normal)   Pulse (!) 107   Ht 5' 4 (1.626 m)   Wt 151 lb (68.5 kg)   LMP  (LMP Unknown)   SpO2 96%   BMI 25.92 kg/m   GEN: Well nourished, well developed,  in no acute distress  HEENT: normal  Neck: JVP is normal, no carotid bruits Cardiac: RRR; Gr I-II / VI systolic murmur apex   No LE edema  Respiratory:  clear to auscultation bilaterally GI: soft, nontender   No hepatomegaly  Ext  No LE edema    EKG:  EKG Sinus tachycardia    107 bpm   Nonspecific ST changes   Monitor FEb 2024   SR with rare PAC, PVC   Couple short bursts of SVT   Echo  Jan 2024  1. Left ventricular ejection fraction, by estimation, is 55 to 60%. The  left ventricle has normal function. The left ventricle has no regional  wall motion abnormalities. Indeterminate diastolic filling due to E-A  fusion.   2. Right ventricular systolic function is normal. The right ventricular  size is normal. There is normal pulmonary artery systolic pressure. The  estimated right ventricular systolic pressure is 29.4 mmHg.   3. The mitral valve is degenerative. Moderate mitral valve regurgitation.  No evidence of mitral stenosis. 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   4. The aortic valve is tricuspid. Aortic valve regurgitation is not  visualized. No aortic stenosis is present.   5. The inferior vena cava is normal in size with greater than 50%  respiratory variability, suggesting right atrial pressure of 3 mmHg.  Lipid Panel No results found for: CHOL, TRIG, HDL, CHOLHDL, VLDL, LDLCALC, LDLDIRECT    Wt Readings from Last 3 Encounters:  02/06/24 151 lb (68.5 kg)  01/31/23 152 lb (68.9 kg)  03/15/22 148 lb 9.6 oz (67.4 kg)      ASSESSMENT AND PLAN:  1  Mitral regurgitation  MR remains moderate  Continue to follow clinically and with periodic echoes    2  Hx of Benson    Zio patch showed rare PVC  Follow   3  Sinus tachycardia   Will check CBC and TSH    Note monitor in past showed SR/ST with average HR below 100     Follow   4 Atherosclerosis Rx risk factors  Pt denies symptoms   5  HL  Pt on Crestor  5 mg daily     Last lipids in July 2025 LDL 93   HDL 124    WIll check NMR panel  Had been good in the past Check Lpa as well   Follow up in 12 months after echo   Current medicines are reviewed at length with the patient today.  The patient does not have concerns regarding medicines.  Signed, Jasmine Gull, MD  02/06/2024 10:20 AM    [facility] [address] [place], [place], [place]  [address] Phone: [phone]; Fax: [phone]

## 2024-02-06 ENCOUNTER — Ambulatory Visit: Payer: Self-pay | Attending: Internal Medicine | Admitting: Internal Medicine

## 2024-02-06 VITALS — BP 98/60 | HR 107 | Ht 64.0 in | Wt 151.0 lb

## 2024-02-06 DIAGNOSIS — I493 Ventricular premature depolarization: Secondary | ICD-10-CM | POA: Diagnosis not present

## 2024-02-06 DIAGNOSIS — E785 Hyperlipidemia, unspecified: Secondary | ICD-10-CM | POA: Diagnosis not present

## 2024-02-06 DIAGNOSIS — I341 Nonrheumatic mitral (valve) prolapse: Secondary | ICD-10-CM | POA: Diagnosis not present

## 2024-02-06 DIAGNOSIS — I10 Essential (primary) hypertension: Secondary | ICD-10-CM

## 2024-02-06 DIAGNOSIS — R000 Tachycardia, unspecified: Secondary | ICD-10-CM | POA: Diagnosis not present

## 2024-02-06 NOTE — Patient Instructions (Addendum)
 Medication Instructions:  Your physician recommends that you continue on your current medications as directed. Please refer to the Current Medication list given to you today.  *If you need a refill on your cardiac medications before your next appointment, please call your pharmacy*  Lab Work: Tsh, cbc, A1c. LpA. NMR If you have labs (blood work) drawn today and your tests are completely normal, you will receive your results only by: MyChart Message (if you have MyChart) OR A paper copy in the mail If you have any lab test that is abnormal or we need to change your treatment, we will call you to review the results.  Testing/Procedures: Echo   Follow-Up: At [facility], you and your health needs are our priority.  As part of our continuing mission to provide you with exceptional heart care, our providers are all part of one team.  This team includes your primary Cardiologist (physician) and Advanced Practice Providers or APPs (Physician Assistants and Nurse Practitioners) who all work together to provide you with the care you need, when you need it.  Your next appointment:   Post echo  Provider:   Dr. Okey  We recommend signing up for the patient portal called MyChart.  Sign up information is provided on this After Visit Summary.  MyChart is used to connect with patients for Virtual Visits (Telemedicine).  Patients are able to view lab/test results, encounter notes, upcoming appointments, etc.  Non-urgent messages can be sent to your provider as well.   To learn more about what you can do with MyChart, go to [email or web address].   Other Instructions NONE

## 2024-02-22 LAB — NMR, LIPOPROFILE

## 2024-02-22 LAB — CBC
Hematocrit: 35.6 % (ref 34.0–46.6)
MCH: 37 pg — ABNORMAL HIGH (ref 26.6–33.0)
MCHC: 34.8 g/dL (ref 31.5–35.7)
MCV: 106 fL — ABNORMAL HIGH (ref 79–97)
RBC: 3.35 x10E6/uL — ABNORMAL LOW (ref 3.77–5.28)
RDW: 11.3 % — ABNORMAL LOW (ref 11.7–15.4)
WBC: 4.5 x10E3/uL (ref 3.4–10.8)

## 2024-02-22 LAB — HEMOGLOBIN A1C
Est. average glucose Bld gHb Est-mCnc: 97 mg/dL
Hgb A1c MFr Bld: 5 % (ref 4.8–5.6)

## 2024-02-22 LAB — LIPOPROTEIN A (LPA): Lipoprotein (a): 211.7 nmol/L — ABNORMAL HIGH

## 2024-02-27 ENCOUNTER — Ambulatory Visit: Payer: Self-pay | Admitting: Internal Medicine

## 2024-02-27 DIAGNOSIS — I340 Nonrheumatic mitral (valve) insufficiency: Secondary | ICD-10-CM

## 2024-02-27 DIAGNOSIS — E785 Hyperlipidemia, unspecified: Secondary | ICD-10-CM

## 2024-02-27 LAB — NMR, LIPOPROFILE
HDL Particle Number: 40.7 umol/L
HDL-C: 144 mg/dL
LDL Particle Number: 504 nmol/L
LDL Size: 21.6 nm

## 2024-02-27 LAB — TSH: TSH: 2.3 u[IU]/mL (ref 0.450–4.500)

## 2024-02-27 LAB — CBC
Hemoglobin: 12.4 g/dL (ref 11.1–15.9)
Platelets: 259 x10E3/uL (ref 150–450)

## 2024-03-02 ENCOUNTER — Telehealth: Payer: Self-pay | Admitting: *Deleted

## 2024-03-02 MED ORDER — ROSUVASTATIN CALCIUM 20 MG PO TABS: 20.0000 mg | ORAL_TABLET | Freq: Every day | ORAL | 3 refills | Status: AC

## 2024-03-02 NOTE — Telephone Encounter (Signed)
 Patient calling and questions and recommendation given per Dr. Okey and pt verbalized an understandiing

## 2024-03-02 NOTE — Telephone Encounter (Signed)
 Recommendations given to patient and patient verbalized an understanding

## 2024-03-02 NOTE — Telephone Encounter (Signed)
 Patient returned RN's call regarding medication.

## 2025-01-24 ENCOUNTER — Ambulatory Visit (HOSPITAL_COMMUNITY)
